# Patient Record
Sex: Female | Born: 1980 | Hispanic: No | Marital: Married | Smoking: Never smoker
Health system: Southern US, Community
[De-identification: ages and names within clinical notes are randomized; demographics above are authoritative.]

## PROBLEM LIST (undated history)

## (undated) ENCOUNTER — Inpatient Hospital Stay (HOSPITAL_COMMUNITY): Payer: Self-pay

## (undated) DIAGNOSIS — IMO0002 Reserved for concepts with insufficient information to code with codable children: Secondary | ICD-10-CM

## (undated) DIAGNOSIS — D649 Anemia, unspecified: Secondary | ICD-10-CM

## (undated) DIAGNOSIS — I10 Essential (primary) hypertension: Secondary | ICD-10-CM

## (undated) DIAGNOSIS — O0930 Supervision of pregnancy with insufficient antenatal care, unspecified trimester: Secondary | ICD-10-CM

## (undated) DIAGNOSIS — O24419 Gestational diabetes mellitus in pregnancy, unspecified control: Secondary | ICD-10-CM

## (undated) DIAGNOSIS — R87619 Unspecified abnormal cytological findings in specimens from cervix uteri: Secondary | ICD-10-CM

## (undated) DIAGNOSIS — J13 Pneumonia due to Streptococcus pneumoniae: Secondary | ICD-10-CM

## (undated) HISTORY — DX: Supervision of pregnancy with insufficient antenatal care, unspecified trimester: O09.30

## (undated) HISTORY — DX: Unspecified abnormal cytological findings in specimens from cervix uteri: R87.619

## (undated) HISTORY — DX: Reserved for concepts with insufficient information to code with codable children: IMO0002

## (undated) HISTORY — PX: NO PAST SURGERIES: SHX2092

## (undated) HISTORY — DX: Anemia, unspecified: D64.9

## (undated) HISTORY — DX: Essential (primary) hypertension: I10

## (undated) HISTORY — DX: Gestational diabetes mellitus in pregnancy, unspecified control: O24.419

---
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
## 2001-03-22 ENCOUNTER — Encounter (INDEPENDENT_AMBULATORY_CARE_PROVIDER_SITE_OTHER): Payer: Self-pay | Admitting: Specialist

## 2001-03-22 ENCOUNTER — Inpatient Hospital Stay (HOSPITAL_COMMUNITY): Admission: AD | Admit: 2001-03-22 | Discharge: 2001-03-25 | Payer: Self-pay | Admitting: *Deleted

## 2002-03-25 ENCOUNTER — Other Ambulatory Visit: Admission: RE | Admit: 2002-03-25 | Discharge: 2002-03-25 | Payer: Self-pay | Admitting: Obstetrics & Gynecology

## 2003-07-01 ENCOUNTER — Inpatient Hospital Stay (HOSPITAL_COMMUNITY): Admission: AD | Admit: 2003-07-01 | Discharge: 2003-07-04 | Payer: Self-pay | Admitting: Obstetrics & Gynecology

## 2004-01-27 ENCOUNTER — Ambulatory Visit (HOSPITAL_COMMUNITY): Admission: RE | Admit: 2004-01-27 | Discharge: 2004-01-27 | Payer: Self-pay | Admitting: Obstetrics & Gynecology

## 2004-06-23 ENCOUNTER — Inpatient Hospital Stay (HOSPITAL_COMMUNITY): Admission: AD | Admit: 2004-06-23 | Discharge: 2004-06-25 | Payer: Self-pay | Admitting: Obstetrics & Gynecology

## 2006-11-17 ENCOUNTER — Ambulatory Visit (HOSPITAL_COMMUNITY): Admission: RE | Admit: 2006-11-17 | Discharge: 2006-11-17 | Payer: Self-pay | Admitting: Obstetrics & Gynecology

## 2007-01-29 ENCOUNTER — Ambulatory Visit (HOSPITAL_COMMUNITY): Admission: RE | Admit: 2007-01-29 | Discharge: 2007-01-29 | Payer: Self-pay | Admitting: Obstetrics & Gynecology

## 2007-06-21 ENCOUNTER — Ambulatory Visit (HOSPITAL_COMMUNITY): Admission: RE | Admit: 2007-06-21 | Discharge: 2007-06-21 | Payer: Self-pay | Admitting: Obstetrics & Gynecology

## 2007-06-23 ENCOUNTER — Inpatient Hospital Stay (HOSPITAL_COMMUNITY): Admission: AD | Admit: 2007-06-23 | Discharge: 2007-06-25 | Payer: Self-pay | Admitting: Obstetrics & Gynecology

## 2008-09-22 ENCOUNTER — Emergency Department (HOSPITAL_COMMUNITY): Admission: EM | Admit: 2008-09-22 | Discharge: 2008-09-23 | Payer: Self-pay | Admitting: Emergency Medicine

## 2009-05-13 ENCOUNTER — Emergency Department (HOSPITAL_COMMUNITY): Admission: EM | Admit: 2009-05-13 | Discharge: 2009-05-13 | Payer: Self-pay | Admitting: Family Medicine

## 2009-06-03 ENCOUNTER — Encounter (INDEPENDENT_AMBULATORY_CARE_PROVIDER_SITE_OTHER): Payer: Self-pay | Admitting: Family Medicine

## 2009-06-03 ENCOUNTER — Ambulatory Visit: Payer: Self-pay | Admitting: Family Medicine

## 2009-06-03 LAB — CONVERTED CEMR LAB
ALT: 19 units/L (ref 0–35)
AST: 17 units/L (ref 0–37)
Albumin: 4.2 g/dL (ref 3.5–5.2)
Basophils Relative: 0 % (ref 0–1)
CRP: 2.9 mg/dL — ABNORMAL HIGH (ref <0.6)
Calcium: 9.3 mg/dL (ref 8.4–10.5)
Cholesterol: 182 mg/dL (ref 0–200)
Eosinophils Absolute: 1.2 10*3/uL — ABNORMAL HIGH (ref 0.0–0.7)
Eosinophils Relative: 23 % — ABNORMAL HIGH (ref 0–5)
HDL: 60 mg/dL (ref >39)
LDL Cholesterol: 102 mg/dL — ABNORMAL HIGH (ref 0–99)
Lymphocytes Relative: 27 % (ref 12–46)
Monocytes Absolute: 0.6 10*3/uL (ref 0.1–1.0)
Monocytes Relative: 12 % (ref 3–12)
Neutro Abs: 2 10*3/uL (ref 1.7–7.7)
Neutrophils Relative %: 38 % — ABNORMAL LOW (ref 43–77)
Platelets: 305 10*3/uL (ref 150–400)
RDW: 17.5 % — ABNORMAL HIGH (ref 11.5–15.5)
Sodium: 137 meq/L (ref 135–145)
Total Bilirubin: 0.2 mg/dL — ABNORMAL LOW (ref 0.3–1.2)
Total CHOL/HDL Ratio: 3
VLDL: 20 mg/dL (ref 0–40)
Vit D, 25-Hydroxy: 26 ng/mL — ABNORMAL LOW (ref 30–89)
WBC: 5.4 10*3/uL (ref 4.0–10.5)

## 2009-06-04 ENCOUNTER — Encounter (INDEPENDENT_AMBULATORY_CARE_PROVIDER_SITE_OTHER): Payer: Self-pay | Admitting: Family Medicine

## 2009-08-21 ENCOUNTER — Encounter (INDEPENDENT_AMBULATORY_CARE_PROVIDER_SITE_OTHER): Payer: Self-pay | Admitting: Family Medicine

## 2009-08-21 ENCOUNTER — Ambulatory Visit: Payer: Self-pay | Admitting: Internal Medicine

## 2009-08-21 LAB — CONVERTED CEMR LAB
Basophils Relative: 1 % (ref 0–1)
CRP: 2.3 mg/dL — ABNORMAL HIGH (ref <0.6)
Casts: NONE SEEN /lpf
Eosinophils Absolute: 0.8 10*3/uL — ABNORMAL HIGH (ref 0.0–0.7)
Eosinophils Relative: 16 % — ABNORMAL HIGH (ref 0–5)
Free T4: 1.04 ng/dL (ref 0.80–1.80)
HCT: 36 % (ref 36.0–46.0)
Lymphocytes Relative: 21 % (ref 12–46)
Lymphs Abs: 1.1 10*3/uL (ref 0.7–4.0)
MCHC: 31.7 g/dL (ref 30.0–36.0)
Monocytes Absolute: 0.6 10*3/uL (ref 0.1–1.0)
Monocytes Relative: 11 % (ref 3–12)
Neutro Abs: 2.7 10*3/uL (ref 1.7–7.7)
Neutrophils Relative %: 52 % (ref 43–77)
RBC: 4.8 M/uL (ref 3.87–5.11)
RDW: 14.9 % (ref 11.5–15.5)
TIBC: 371 ug/dL (ref 250–470)
UIBC: 344 ug/dL

## 2010-01-12 ENCOUNTER — Encounter: Payer: Self-pay | Admitting: Family Medicine

## 2010-01-12 ENCOUNTER — Ambulatory Visit (HOSPITAL_COMMUNITY)
Admission: RE | Admit: 2010-01-12 | Discharge: 2010-01-12 | Payer: Self-pay | Source: Home / Self Care | Attending: Family Medicine | Admitting: Family Medicine

## 2010-02-17 ENCOUNTER — Encounter: Payer: Self-pay | Admitting: Family Medicine

## 2010-02-17 ENCOUNTER — Ambulatory Visit (HOSPITAL_COMMUNITY)
Admission: RE | Admit: 2010-02-17 | Discharge: 2010-02-17 | Payer: Self-pay | Source: Home / Self Care | Attending: Family Medicine | Admitting: Family Medicine

## 2010-04-13 LAB — POCT URINALYSIS DIP (DEVICE)
Bilirubin Urine: NEGATIVE
Glucose, UA: NEGATIVE mg/dL
Nitrite: NEGATIVE
Specific Gravity, Urine: 1.015 (ref 1.005–1.030)
Urobilinogen, UA: 1 mg/dL (ref 0.0–1.0)
pH: 7 (ref 5.0–8.0)

## 2010-05-05 ENCOUNTER — Inpatient Hospital Stay (HOSPITAL_COMMUNITY): Admission: AD | Admit: 2010-05-05 | Payer: Self-pay | Admitting: Obstetrics & Gynecology

## 2010-06-08 NOTE — H&P (Signed)
Sarah Arnold, Sarah Arnold             ACCOUNT NO.:  [id number]   MEDICAL RECORD NO.:  [id number]          PATIENT TYPE:  INP   LOCATION:  9144                          FACILITY:  [facility]   PHYSICIAN:  Roseanna Rainbow, M.D.DATE OF BIRTH:  21-Jun-1980   DATE OF ADMISSION:  06/23/2007  DATE OF DISCHARGE:                              HISTORY & PHYSICAL   CHIEF COMPLAINT:  The patient is a 30 year old para 3 with an estimated  date of confinement of Jun 16, 2007, with an intrauterine pregnancy at  55 weeks' for induction of labor secondary to postdates.   HISTORY OF PRESENT ILLNESS:  Please see the above.   ALLERGIES:  No known drug allergies.   MEDICATIONS:  Prenatal vitamins.   OB RISK FACTORS:  Anemia, GBS positive, and hyperemesis gravidarum  earlier in the pregnancy.   PRENATAL LABS:  Blood type is O+, antibody screen negative.  Chlamydia  probe negative.  Urine culture and sensitivity, insignificant growth.  GC probe negative.  Pap smear negative.  One-hour GCT 127.  Hepatitis B  surface antigen negative.  Hematocrit 28.8 and hemoglobin 8.8.  HIV  nonreactive.  Platelets 279,000.  RPR nonreactive.  Rubella immune.  Sickle cell negative.  GBS positive on May 15, 2007.   PAST OBSTETRICAL HISTORY:  In May 2006, she was delivered of a liveborn  female at full term, 7 pounds 15 ounces, vaginal delivery, no  complications.  In June 2005, she was delivered of a liveborn female, 6  pounds 10 ounces at 42 weeks', vaginal delivery.  In February 2003, she  was delivered of a liveborn female, full term, 6 pounds 6 ounces,  vaginal delivery, no complications.   PAST MEDICAL HISTORY:  No significant history of medical diseases.   PAST SURGICAL HISTORY:  No previous surgery.   SOCIAL HISTORY:  She is unemployed, engaged, living with the significant  other.  She does not give any significant history of alcohol usage.  She  has no significant smoking history.  She denies illicit drug  use.   FAMILY HISTORY:  No major illnesses known.   PHYSICAL EXAMINATION:  Vital signs are stable and afebrile.  Fetal heart  tracing is reassuring.  Tocodynamometer, irregular uterine contractions.  Sterile vaginal exam per the RN, the cervix is 1 cm dilated, 70%  effaced.   ASSESSMENT:  Multipara at 16 weeks' with a favorable Bishop score.  Fetal heart tracing is consistent with fetal well-being.  Group B  streptococcus positive.  Mild anemia.   PLAN:  Admission.  Penicillin for GBS prophylaxis.  Low-dose Pitocin per  protocol.      Roseanna Rainbow, M.D.  Electronically Signed     LAJ/MEDQ  D:  06/23/2007  T:  06/23/2007  Job:  [id number]

## 2010-06-11 NOTE — H&P (Signed)
[facility]  Patient:    Sarah Arnold, Sarah Arnold Visit Number: [id number] MRN: [id number]          Service Type: OBS Location: 910B [id number] [id number] Attending Physician:  Genia Del Dictated by:   Genia Del, M.D. Admit Date:  03/22/2001                           History and Physical  DATE OF BIRTH:                May 21, 1980  PATIENT PROFILE:              The patient is a 30 year old woman, G1, expected date of delivery by ultrasound March 29, 2001, at [redacted] weeks gestation.  REASON FOR ADMISSION:         Spontaneous rupture of membranes with uterine contractions.  HISTORY OF PRESENT ILLNESS:   The patient had small amounts of clear fluid since 5 p.m. March 22, 2001.  She also mentions a very small amount of clear fluid on the 26th at 4 p.m.  Uterine contractions started increasing during last evening.  No vaginal bleeding.  No fever.  Good fetal movements. No PIH symptoms.  PAST MEDICAL HISTORY:         Negative.  PAST SURGICAL HISTORY:        Negative.  PAST OBSTETRICAL HISTORY:     Current.  PAST GYNECOLOGIC HISTORY:     Known uterine fibroids as of ultrasound November 2001.  ALLERGIES:                    No known drug allergies.  MEDICATIONS:                  Prenatal vitamins.  FAMILY HISTORY:               Noncontributory.  SOCIAL HISTORY:               Married, nonsmoker.  HISTORY OF PRESENT PREGNANCY:                    First trimester normal.  Labs showed a hemoglobin at 11.7, platelets 319.  Blood type Rh O positive, Rh antibody is negative.  Sickle cell trait negative.  Thalassemia negative.  RPR nonreactive.  HBsAg negative.  HIV negative.  Rubella immune.  The patient used Zofran and Reglan for hyperemesis gravidarum which only improved at the end of the second trimester.  Triple test was within normal limits at 16+ weeks.  An ultrasound done at 17+ weeks showed nonconcordant dating and expected date of  delivery was changed to ultrasound.  Ultrasound review of anatomy at 21+ weeks was within normal limits, placenta was posterior normal, amniotic fluid within normal limits, cervix was long and closed.  In the third trimester one-hour GTT was within normal limits.  At 32+ weeks an ultrasound for interval growth showed a sixth percentile; the patient was advised bed rest and increased proteins.  She was then followed with NSTs which were reactive, and a repeat ultrasound at 35+ weeks showed an 18th percentile estimated fetal weight.  Biophysical profiles were reassuring at 8/10 at 37+ weeks and amniotic fluid index was 80th percentile.  NSTs remained reactive at 38+ weeks.  Blood pressures remained normal throughout pregnancy. Group B strep was positive.  REVIEW OF SYSTEMS:            Constitutional: Negative.  HEENT: Negative. Cardiovascular, respiratory, GI, urology: Negative.  Neurologic, endocrine and dermatome: Negative.  PHYSICAL EXAMINATION:  GENERAL:                      No apparent distress except for pain with uterine contractions.  VITAL SIGNS:                  Blood pressure 112/72, respiratory rate 18, temperature 98.3, heart rate 98 and regular.  LUNGS:                        Clear bilaterally.  HEART:                        Regular cardiac rhythm, no murmur.  ABDOMEN:                      Gravid, cephalic presentation.  VAGINAL EXAM:                 Fingertip, 90%, vertex -1, bulging membranes felt but fern positive.  EXTREMITIES:                  Lower limbs normal.  MONITORING:                   Uterine contractions every three minutes 2+ lasting about 50 seconds.  FETAL HEART RATE MONITORING:  Baseline 130 with good variability, accelerations positive, no decelerations.  IMPRESSION:                   1. Gravida 1, [redacted] weeks gestation with                                  spontaneous rupture of membranes.                               2. Fetal heart rate  monitoring reassuring.                               3. Group B Streptococcus positive.  PLAN:                         Admit to labor and delivery.  Monitoring. Penicillin G protocol.  Expectant management towards probable vaginal delivery. Dictated by:   Genia Del, M.D. Attending Physician:  Genia Del DD:  03/23/01 TD:  03/23/01 Job: [id number]

## 2010-06-22 ENCOUNTER — Inpatient Hospital Stay (HOSPITAL_COMMUNITY)
Admission: AD | Admit: 2010-06-22 | Discharge: 2010-06-22 | Disposition: A | Discharge status: Home / Self Care | Payer: Self-pay | Source: Ambulatory Visit | Attending: Family Medicine | Admitting: Family Medicine

## 2010-06-22 DIAGNOSIS — O479 False labor, unspecified: Secondary | ICD-10-CM

## 2010-06-23 ENCOUNTER — Inpatient Hospital Stay (HOSPITAL_COMMUNITY)
Admission: EM | Admit: 2010-06-23 | Discharge: 2010-06-25 | DRG: 775 | Disposition: A | Discharge status: Home / Self Care | Payer: Medicaid Other | Source: Ambulatory Visit | Attending: Family Medicine | Admitting: Family Medicine

## 2010-06-23 LAB — CBC
Hemoglobin: 10.3 g/dL — ABNORMAL LOW (ref 12.0–15.0)
MCH: 22.1 pg — ABNORMAL LOW (ref 26.0–34.0)
Platelets: 268 10*3/uL (ref 150–400)
RDW: 17.2 % — ABNORMAL HIGH (ref 11.5–15.5)

## 2010-06-23 LAB — ABO/RH: ABO/RH(D): O POS

## 2010-07-23 ENCOUNTER — Inpatient Hospital Stay (HOSPITAL_COMMUNITY)
Admission: AD | Admit: 2010-07-23 | Discharge: 2010-07-23 | Disposition: A | Discharge status: Home / Self Care | Payer: Self-pay | Source: Ambulatory Visit | Attending: Obstetrics & Gynecology | Admitting: Obstetrics & Gynecology

## 2010-07-23 DIAGNOSIS — O2390 Unspecified genitourinary tract infection in pregnancy, unspecified trimester: Secondary | ICD-10-CM | POA: Insufficient documentation

## 2010-07-23 DIAGNOSIS — N390 Urinary tract infection, site not specified: Secondary | ICD-10-CM | POA: Insufficient documentation

## 2010-07-23 LAB — URINALYSIS, ROUTINE W REFLEX MICROSCOPIC
Glucose, UA: NEGATIVE mg/dL
Ketones, ur: NEGATIVE mg/dL
Nitrite: POSITIVE — AB
Protein, ur: 100 mg/dL — AB
Specific Gravity, Urine: 1.015 (ref 1.005–1.030)
Urobilinogen, UA: 0.2 mg/dL (ref 0.0–1.0)
pH: 6 (ref 5.0–8.0)

## 2010-07-23 LAB — URINE MICROSCOPIC-ADD ON

## 2010-07-23 LAB — CBC
HCT: 33.1 % — ABNORMAL LOW (ref 36.0–46.0)
MCH: 22.7 pg — ABNORMAL LOW (ref 26.0–34.0)
MCHC: 32.3 g/dL (ref 30.0–36.0)
WBC: 9.6 10*3/uL (ref 4.0–10.5)

## 2010-07-25 LAB — URINE CULTURE

## 2010-10-20 LAB — CBC
HCT: 32.3 — ABNORMAL LOW
Hemoglobin: 10.3 — ABNORMAL LOW
MCHC: 31.9
MCV: 78.2
WBC: 14.2 — ABNORMAL HIGH

## 2010-10-20 LAB — RPR: RPR Ser Ql: NONREACTIVE

## 2011-01-19 ENCOUNTER — Encounter (HOSPITAL_COMMUNITY): Payer: Self-pay | Admitting: Family Medicine

## 2011-01-19 ENCOUNTER — Emergency Department (INDEPENDENT_AMBULATORY_CARE_PROVIDER_SITE_OTHER): Payer: Self-pay

## 2011-01-19 ENCOUNTER — Emergency Department (HOSPITAL_COMMUNITY)
Admission: EM | Admit: 2011-01-19 | Discharge: 2011-01-19 | Disposition: A | Discharge status: Home / Self Care | Payer: Self-pay | Source: Home / Self Care | Attending: Family Medicine | Admitting: Family Medicine

## 2011-01-19 DIAGNOSIS — J189 Pneumonia, unspecified organism: Secondary | ICD-10-CM

## 2011-01-19 MED ORDER — FEXOFENADINE-PSEUDOEPHED ER 60-120 MG PO TB12: 1.0000 | ORAL_TABLET | Freq: Two times a day (BID) | ORAL | Status: DC

## 2011-01-19 MED ORDER — IBUPROFEN 600 MG PO TABS: 600.0000 mg | ORAL_TABLET | Freq: Three times a day (TID) | ORAL | Status: DC | PRN

## 2011-01-19 MED ORDER — AZITHROMYCIN 250 MG PO TABS: 250.0000 mg | ORAL_TABLET | Freq: Every day | ORAL | Status: DC

## 2011-01-19 MED ORDER — CEFTRIAXONE SODIUM 1 G IJ SOLR
INTRAMUSCULAR | Status: AC
  Filled 2011-01-19: qty 10

## 2011-01-19 MED ORDER — AZITHROMYCIN 250 MG PO TABS
ORAL_TABLET | ORAL | Status: AC
  Filled 2011-01-19: qty 4

## 2011-01-19 MED ORDER — ONDANSETRON 4 MG PO TBDP
4.0000 mg | ORAL_TABLET | Freq: Once | ORAL | Status: AC
  Administered 2011-01-19: 4 mg via ORAL

## 2011-01-19 MED ORDER — AZITHROMYCIN 250 MG PO TABS: 250.0000 mg | ORAL_TABLET | Freq: Every day | ORAL | Status: AC

## 2011-01-19 MED ORDER — AZITHROMYCIN 1 G PO PACK
1.0000 g | PACK | Freq: Once | ORAL | Status: AC
  Administered 2011-01-19: 1 g via ORAL

## 2011-01-19 MED ORDER — PREDNISONE 20 MG PO TABS: ORAL_TABLET | ORAL | Status: DC

## 2011-01-19 MED ORDER — ONDANSETRON 4 MG PO TBDP
ORAL_TABLET | ORAL | Status: AC
  Filled 2011-01-19: qty 1

## 2011-01-19 MED ORDER — ACETAMINOPHEN-CODEINE 120-12 MG/5ML PO SUSP: 5.0000 mL | Freq: Four times a day (QID) | ORAL | Status: DC | PRN

## 2011-01-19 MED ORDER — HYDROCODONE-ACETAMINOPHEN 7.5-325 MG/15ML PO SOLN: 15.0000 mL | Freq: Three times a day (TID) | ORAL | Status: AC | PRN

## 2011-01-19 MED ORDER — CEFTRIAXONE SODIUM 1 G IJ SOLR
1.0000 g | Freq: Once | INTRAMUSCULAR | Status: AC
  Administered 2011-01-19: 1 g via INTRAMUSCULAR

## 2011-01-19 NOTE — ED Notes (Signed)
PT MONITORED POST ZOFRAN.STATES SX RESOLVED AND PT D/C'D

## 2011-01-19 NOTE — ED Notes (Signed)
PT D'CD BUT RETURNED FROM WAITNG ROOM C/O NAUSEA AND FEELING OF VOMITING.MEDICATED WITH ZOFRAN 4MG  ODT..WILL MONITOR PT WHILE IN WAITING AREA

## 2011-01-19 NOTE — ED Provider Notes (Signed)
History     CSN: [id number]  Arrival date & time 01/19/11  1616   First MD Initiated Contact with Patient 01/19/11 1706      Chief Complaint  Patient presents with  . URI  . Influenza    (Consider location/radiation/quality/duration/timing/severity/associated sxs/prior treatment) HPI Comments: 30 y/o female non smoker, no significant past medical history comes c/o cold symptoms that started about 1 week ago; comes today with persistent cough, headache and subjective fever, decreased appetite, denies shortness of breath, chest pain or pain with inspiration. No vomiting or diarrhea. Has not taken any medication today for pain or fever. Feels tired.   History reviewed. No pertinent past medical history.  No past surgical history on file.  No family history on file.  History  Substance Use Topics  . Smoking status: Not on file  . Smokeless tobacco: Not on file  . Alcohol Use: Not on file    OB History    Grav Para Term Preterm Abortions TAB SAB Ect Mult Living                  Review of Systems  Constitutional: Positive for fever, appetite change and fatigue.  HENT: Positive for congestion, sore throat and rhinorrhea. Negative for ear pain, trouble swallowing, neck pain, neck stiffness and voice change.   Respiratory: Positive for cough. Negative for chest tightness, shortness of breath, wheezing and stridor.   Cardiovascular: Negative for chest pain, palpitations and leg swelling.  Gastrointestinal: Negative for nausea, vomiting, abdominal pain and diarrhea.  Musculoskeletal: Positive for myalgias and arthralgias.  Skin: Negative for rash.  Neurological: Positive for headaches.    Allergies  Review of patient's allergies indicates no known allergies.  Home Medications   Current Outpatient Rx  Name Route Sig Dispense Refill  . AZITHROMYCIN 250 MG PO TABS Oral Take 1 tablet (250 mg total) by mouth daily. Take first 2 tablets together, then 1 every day until  finished. 6 tablet 0  . HYDROCODONE-ACETAMINOPHEN 7.5-325 MG/15ML PO SOLN Oral Take 15 mLs by mouth every 8 (eight) hours as needed for pain. 120 mL 0    BP 101/69  Pulse 94  Temp(Src) 99.3 F (37.4 C) (Oral)  Resp 16  SpO2 100%  Physical Exam  Nursing note and vitals reviewed. Constitutional: She is oriented to person, place, and time. She appears well-developed and well-nourished. No distress.  HENT:  Head: Normocephalic and atraumatic.  Right Ear: External ear normal.  Left Ear: External ear normal.       Pharyngeal erythema with exudate in right tonsil. No peritonsillar edema or fluctuation, uvula central. No retropharyngeal or peritonsillar abscess.   Eyes: Conjunctivae and EOM are normal. Pupils are equal, round, and reactive to light. Right eye exhibits no discharge. Left eye exhibits no discharge.  Neck: Normal range of motion. Neck supple.  Cardiovascular: Normal rate, regular rhythm, normal heart sounds and intact distal pulses.   No murmur heard. Pulmonary/Chest: Effort normal.       Decreased breath sounds and extensive crackles in right base. No tachypnea or pain with deep inspiration.  Abdominal: Soft. There is no tenderness.  Musculoskeletal: She exhibits no edema.  Lymphadenopathy:    She has no cervical adenopathy.  Neurological: She is alert and oriented to person, place, and time.  Skin: No rash noted.    ED Course  Procedures (including critical care time)  Labs Reviewed - No data to display Dg Chest 2 View  01/19/2011  *RADIOLOGY REPORT*  Clinical  Data: Cough, congestion  CHEST - 2 VIEW  Comparison: 09/22/2008  Findings: Coarse nodular/interstitial opacities throughout all lobes, with a right lower lobe predominance, suspicious for multifocal pneumonia.  No pleural effusion or pneumothorax.  Cardiomediastinal silhouette is within normal limits.  Visualized osseous structures are within normal limits.  IMPRESSION: Suspected multifocal pneumonia with a right  lower lobe predominance.  Viral or atypical bacterial infection is suspected.  Follow-up chest radiographs are suggested to document resolution.  Original Report Authenticated By: Charline Bills, M.D.     1. Atypical pneumonia       MDM  Non toxic appearance, clinically well does not correlates with extensive Xrays findings. Suspect influenza pneumonia vs atypical pneumonia. Administered rocephin 1g IM and azithromycin 1 g oral here. prescribed azithromycin oral to continue outpatient therapy will recheck in 48 hours or patient will return earlier if respiratory distress or any worsening of her symptoms.        Sharin Grave, MD 01/20/11 [phone]

## 2011-01-19 NOTE — ED Notes (Signed)
PT HERE WITH FLU LIE/URI SX THAT STARTED X 1 WK WITH COUGHING  YELLOW/BLOOD TINGED MUCOUS,NASAL CONGESTION AND BODY ACHES AND FATIGUE.TEMP ON ARRIVAL 99.3.DENIES N/V/D

## 2011-01-21 ENCOUNTER — Emergency Department (INDEPENDENT_AMBULATORY_CARE_PROVIDER_SITE_OTHER)
Admission: EM | Admit: 2011-01-21 | Discharge: 2011-01-21 | Disposition: A | Discharge status: Home / Self Care | Payer: Self-pay | Source: Home / Self Care | Attending: Family Medicine | Admitting: Family Medicine

## 2011-01-21 ENCOUNTER — Encounter (HOSPITAL_COMMUNITY): Payer: Self-pay | Admitting: *Deleted

## 2011-01-21 DIAGNOSIS — J189 Pneumonia, unspecified organism: Secondary | ICD-10-CM

## 2011-01-21 MED ORDER — AZITHROMYCIN 250 MG PO TABS: ORAL_TABLET | ORAL | Status: AC

## 2011-01-21 NOTE — ED Provider Notes (Signed)
History     CSN: [id number]  Arrival date & time 01/21/11  1509   None     Chief Complaint  Patient presents with  . Follow-up    (Consider location/radiation/quality/duration/timing/severity/associated sxs/prior treatment) Patient is a 30 y.o. female presenting with cough. The history is provided by the patient.  Cough This is a new problem. Episode onset: seen 12/26 and inproving with pna, lost last 2 pills ,  The problem has been gradually improving (less cough, no fever or phlegm., no sob.). The cough is non-productive. There has been no fever.    History reviewed. No pertinent past medical history.  History reviewed. No pertinent past surgical history.  History reviewed. No pertinent family history.  History  Substance Use Topics  . Smoking status: Not on file  . Smokeless tobacco: Not on file  . Alcohol Use: Not on file    OB History    Grav Para Term Preterm Abortions TAB SAB Ect Mult Living                  Review of Systems  Constitutional: Negative.   HENT: Negative.   Respiratory: Positive for cough.   Cardiovascular: Negative.   Gastrointestinal: Negative.   Skin: Negative.     Allergies  Review of patient's allergies indicates no known allergies.  Home Medications   Current Outpatient Rx  Name Route Sig Dispense Refill  . AZITHROMYCIN 250 MG PO TABS  Take as directed on pack 6 each 0  . AZITHROMYCIN 250 MG PO TABS Oral Take 1 tablet (250 mg total) by mouth daily. Take first 2 tablets together, then 1 every day until finished. 6 tablet 0  . HYDROCODONE-ACETAMINOPHEN 7.5-325 MG/15ML PO SOLN Oral Take 15 mLs by mouth every 8 (eight) hours as needed for pain. 120 mL 0    BP 103/63  Pulse 79  Temp(Src) 97.6 F (36.4 C) (Oral)  Resp 20  SpO2 100%  LMP 11/24/2010  Physical Exam  Nursing note and vitals reviewed. Constitutional: She appears well-developed and well-nourished.  HENT:  Head: Normocephalic.  Right Ear: External ear normal.    Left Ear: External ear normal.  Mouth/Throat: Oropharynx is clear and moist.  Eyes: Pupils are equal, round, and reactive to light.  Neck: Normal range of motion. Neck supple.  Pulmonary/Chest: Effort normal and breath sounds normal. She has no wheezes. She has no rales.  Lymphadenopathy:    She has no cervical adenopathy.  Neurological: She is alert.  Skin: Skin is warm and dry.  Psychiatric: She has a normal mood and affect.    ED Course  Procedures (including critical care time)  Labs Reviewed - No data to display No results found.   1. CAP (community acquired pneumonia)       MDM          Barkley Bruns, MD 01/25/11 [phone]

## 2011-01-21 NOTE — ED Notes (Signed)
Pt  Seen  2  Days  Ago  For  pnuemonia   She  Reports  She  Feels  Better  She  States  She  Lost  2  Of  Her  zithromax  Pills      She  Is  Awake as  Well as  Alert  And  Oriented

## 2011-08-15 ENCOUNTER — Emergency Department (INDEPENDENT_AMBULATORY_CARE_PROVIDER_SITE_OTHER): Payer: Self-pay

## 2011-08-15 ENCOUNTER — Encounter (HOSPITAL_COMMUNITY): Payer: Self-pay

## 2011-08-15 ENCOUNTER — Emergency Department (INDEPENDENT_AMBULATORY_CARE_PROVIDER_SITE_OTHER)
Admission: EM | Admit: 2011-08-15 | Discharge: 2011-08-15 | Disposition: A | Discharge status: Home / Self Care | Payer: Self-pay | Source: Home / Self Care | Attending: Emergency Medicine | Admitting: Emergency Medicine

## 2011-08-15 DIAGNOSIS — J189 Pneumonia, unspecified organism: Secondary | ICD-10-CM

## 2011-08-15 LAB — CBC WITH DIFFERENTIAL/PLATELET
Eosinophils Absolute: 1.5 10*3/uL — ABNORMAL HIGH (ref 0.0–0.7)
Hemoglobin: 11 g/dL — ABNORMAL LOW (ref 12.0–15.0)
Lymphocytes Relative: 19 % (ref 12–46)
Lymphs Abs: 1.4 10*3/uL (ref 0.7–4.0)
MCH: 22.5 pg — ABNORMAL LOW (ref 26.0–34.0)
Monocytes Relative: 12 % (ref 3–12)
Neutro Abs: 3.7 10*3/uL (ref 1.7–7.7)
Neutrophils Relative %: 50 % (ref 43–77)
Platelets: 318 10*3/uL (ref 150–400)
RBC: 4.88 MIL/uL (ref 3.87–5.11)
WBC: 7.5 10*3/uL (ref 4.0–10.5)

## 2011-08-15 LAB — POCT I-STAT, CHEM 8
BUN: 18 mg/dL (ref 6–23)
Chloride: 104 mEq/L (ref 96–112)
Creatinine, Ser: 1.2 mg/dL — ABNORMAL HIGH (ref 0.50–1.10)
Potassium: 3.6 mEq/L (ref 3.5–5.1)
Sodium: 142 mEq/L (ref 135–145)

## 2011-08-15 MED ORDER — AMOXICILLIN-POT CLAVULANATE 500-125 MG PO TABS: 1.0000 | ORAL_TABLET | Freq: Two times a day (BID) | ORAL | Status: AC

## 2011-08-15 MED ORDER — ALBUTEROL SULFATE HFA 108 (90 BASE) MCG/ACT IN AERS: 1.0000 | INHALATION_SPRAY | Freq: Four times a day (QID) | RESPIRATORY_TRACT | Status: DC | PRN

## 2011-08-15 MED ORDER — AZITHROMYCIN 250 MG PO TABS: 250.0000 mg | ORAL_TABLET | Freq: Every day | ORAL | Status: AC

## 2011-08-15 MED ORDER — GUAIFENESIN-CODEINE 100-10 MG/5ML PO SYRP: 5.0000 mL | ORAL_SOLUTION | Freq: Four times a day (QID) | ORAL | Status: AC | PRN

## 2011-08-15 NOTE — ED Notes (Signed)
Pt states that she has a productive cough x 2 days. Pt states that she has a fever x 1 day. Pt states that "every time I cough, my ribs hurt a lot."

## 2011-08-15 NOTE — ED Provider Notes (Addendum)
History     CSN: [id number]  Arrival date & time 08/15/11  1219   First MD Initiated Contact with Patient 08/15/11 1316      Chief Complaint  Patient presents with  . Cough    (Consider location/radiation/quality/duration/timing/severity/associated sxs/prior treatment) HPI Comments: Patient presents urgent care complaining of worsening productive cough for about 3 days. She has been feeling bodyaches and chills and her cough seemed to be worsening. When she coughs she describes her chest hurts mainly on the right lower area. She vomited once. And has no appetite. Not too long ago she came in she describes as she had pneumonia she's wondering if she's feeling very bad and thinks that this is no longer a cold came in today to be checked.  Patient is a 31 y.o. female presenting with cough. The history is provided by the patient.  Cough This is a new problem. The current episode started more than 2 days ago. The problem occurs constantly. The problem has been gradually worsening. The cough is productive of sputum. The maximum temperature recorded prior to her arrival was 100 to 100.9 F. Associated symptoms include chills, sweats, headaches, rhinorrhea and shortness of breath. Pertinent negatives include no wheezing. She has tried decongestants for the symptoms. She is not a smoker. Her past medical history is significant for pneumonia. Her past medical history does not include COPD, emphysema or asthma.    History reviewed. No pertinent past medical history.  History reviewed. No pertinent past surgical history.  History reviewed. No pertinent family history.  History  Substance Use Topics  . Smoking status: Not on file  . Smokeless tobacco: Not on file  . Alcohol Use: No    OB History    Grav Para Term Preterm Abortions TAB SAB Ect Mult Living                  Review of Systems  Constitutional: Positive for fever, chills, activity change and appetite change.  HENT: Positive  for rhinorrhea. Negative for sneezing, neck pain, neck stiffness and postnasal drip.   Respiratory: Positive for cough and shortness of breath. Negative for apnea, choking, chest tightness, wheezing and stridor.   Neurological: Positive for headaches. Negative for dizziness.    Allergies  Review of patient's allergies indicates no known allergies.  Home Medications  No current outpatient prescriptions on file.  BP 119/83  Pulse 90  Temp 99.8 F (37.7 C) (Oral)  Resp 18  SpO2 96%  Physical Exam  Nursing note and vitals reviewed. Constitutional: Vital signs are normal. She appears well-developed and well-nourished.  Non-toxic appearance. She does not have a sickly appearance. She appears ill. No distress.  HENT:  Head: Normocephalic.  Mouth/Throat: Uvula is midline, oropharynx is clear and moist and mucous membranes are normal.  Eyes: Conjunctivae are normal.  Neck: Normal range of motion. No JVD present.  Cardiovascular: Normal rate and normal heart sounds.  Exam reveals no gallop and no friction rub.   No murmur heard. Pulmonary/Chest: Effort normal. No respiratory distress. She has no decreased breath sounds. She has no wheezes. She has rhonchi. She has rales. She exhibits no tenderness.  Abdominal: Soft. She exhibits no distension. There is no tenderness. There is no rebound.  Musculoskeletal: Normal range of motion.  Lymphadenopathy:    She has no cervical adenopathy.  Neurological: She is alert.  Skin: Skin is warm.    ED Course  Procedures (including critical care time)  Labs Reviewed - No data to  display No results found.   No diagnosis found.    MDM   Patient symptomatic for 3 days only. Bilateral pneumonia with atypical pneumonia features. Have done some preliminary laboratory workup included an HIV test. Patient seemed to be having recurrent pneumonias the last 2 years. Have provided prescriptions of amoxicillin, azithromycin, codeine with Tylenol and  albuterol. Patient was instructed to return in 3 days for recheck. We discussed what symptoms will work further evaluation in the emergency department she feels comfortable with treatment and followup care as necessary       Jimmie Molly, MD 08/15/11 1430  Jimmie Molly, MD 08/15/11 [phone]

## 2011-08-16 LAB — HIV ANTIBODY (ROUTINE TESTING W REFLEX): HIV: NONREACTIVE

## 2011-08-19 ENCOUNTER — Emergency Department (INDEPENDENT_AMBULATORY_CARE_PROVIDER_SITE_OTHER)
Admission: EM | Admit: 2011-08-19 | Discharge: 2011-08-19 | Disposition: A | Discharge status: Home / Self Care | Payer: Self-pay | Source: Home / Self Care | Attending: Emergency Medicine | Admitting: Emergency Medicine

## 2011-08-19 ENCOUNTER — Encounter (HOSPITAL_COMMUNITY): Payer: Self-pay | Admitting: *Deleted

## 2011-08-19 DIAGNOSIS — J189 Pneumonia, unspecified organism: Secondary | ICD-10-CM

## 2011-08-19 HISTORY — DX: Pneumonia due to Streptococcus pneumoniae: J13

## 2011-08-19 NOTE — ED Notes (Addendum)
Pt  Seen   A  Few  Days  Ago  ucc  For    pnuemonia   Here  Today  For  A  followup         She  Reports  She  Is  Taking   Her  meds    And  Is  Feeling  Better

## 2011-08-19 NOTE — ED Provider Notes (Signed)
History     CSN: [id number]  Arrival date & time 08/19/11  1110   First MD Initiated Contact with Patient 08/19/11 1158      Chief Complaint  Patient presents with  . Follow-up    (Consider location/radiation/quality/duration/timing/severity/associated sxs/prior treatment) HPI Comments: Patient returns today for a guided followup visit, she reports she's feeling much better energy is coming back a bit of appetite is coming back, some sporadic cough but no fevers, she's taking antibiotics without any abdominal pain, she denies any fevers or shortness of breath or abdominal pain. Also denies any diarrhea does  The history is provided by the patient and the spouse.    Past Medical History  Diagnosis Date  . Pneumococcal pneumonia     History reviewed. No pertinent past surgical history.  No family history on file.  History  Substance Use Topics  . Smoking status: Not on file  . Smokeless tobacco: Not on file  . Alcohol Use: No    OB History    Grav Para Term Preterm Abortions TAB SAB Ect Mult Living                  Review of Systems  Constitutional: Positive for activity change and appetite change. Negative for fever, chills and diaphoresis.  Respiratory: Positive for cough. Negative for shortness of breath, wheezing and stridor.   Genitourinary: Negative for dysuria.  Musculoskeletal: Negative for back pain.    Allergies  Review of patient's allergies indicates no known allergies.  Home Medications   Current Outpatient Rx  Name Route Sig Dispense Refill  . ALBUTEROL SULFATE HFA 108 (90 BASE) MCG/ACT IN AERS Inhalation Inhale 1 puff into the lungs every 6 (six) hours as needed for wheezing. 1 Inhaler 0  . AMOXICILLIN-POT CLAVULANATE 500-125 MG PO TABS Oral Take 1 tablet (500 mg total) by mouth 2 (two) times daily. 20 tablet 0  . AZITHROMYCIN 250 MG PO TABS Oral Take 1 tablet (250 mg total) by mouth daily. Take first 2 tablets together, then 1 every day until  finished. 6 tablet 0  . GUAIFENESIN-CODEINE 100-10 MG/5ML PO SYRP Oral Take 5 mLs by mouth 4 (four) times daily as needed for cough. 120 mL 0    BP 106/69  Pulse 84  Temp 97.7 F (36.5 C) (Oral)  Resp 16  SpO2 96%  LMP 08/11/2011  Physical Exam  Constitutional: Vital signs are normal. She appears well-developed and well-nourished.  Non-toxic appearance. She does not have a sickly appearance. She does not appear ill.  Eyes: Conjunctivae are normal.  Cardiovascular: Normal rate and regular rhythm.   Pulmonary/Chest: Effort normal and breath sounds normal. No respiratory distress. She has no decreased breath sounds. She has no wheezes. She has no rhonchi. She has no rales. She exhibits no tenderness.  Skin: Skin is warm. No erythema.    ED Course  Procedures (including critical care time)  Labs Reviewed - No data to display No results found.   1. Community acquired pneumonia       MDM   Patient with remarkable clinical improvement, smiling feeling better mild sporadic cough afebrile for the last 2 days. She's taking her medications without problems which were confirmed to be Augmentin and azithromycin. Has taken some of the syrup. We have reviewed her labs we discuss a mild discrete anemia possibly iron deficiency although studies have not been done have encouraged patient to followup with primary care Dr. about 3 months to recheck her blood. We discussed  what symptoms will warrant evaluation in the emergency department, although I predicted this community acquired pneumonia will resolve after antibiotic regimen is completed.       Jimmie Molly, MD 08/19/11 1215

## 2012-01-25 NOTE — L&D Delivery Note (Signed)
Delivery Note SROM with SVE 2 min prior to birth>large amount watery mecomium, cx 7/80/0 OA. Large amt watery meconium-tinged AF. At 10:17 AM a viable female was delivered with single push. Controlled delivery.  via Vaginal, Spontaneous Delivery (Presentation: Left Occiput Anterior).  APGAR: 9, 9; weight pending Placenta status: Intact, Spontaneous.  Cord: 3 vessels with the following complications: None.   Anesthesia: None  Episiotomy: None Lacerations: None  Est. Blood Loss (mL): 400  Mom to postpartum.  Baby to nursery-stable. No abx given for GBS pos.  Sarah Arnold 10/05/2012, 10:47 AM

## 2012-05-04 ENCOUNTER — Emergency Department (INDEPENDENT_AMBULATORY_CARE_PROVIDER_SITE_OTHER)
Admission: EM | Admit: 2012-05-04 | Discharge: 2012-05-04 | Disposition: A | Discharge status: Home / Self Care | Payer: Self-pay | Source: Home / Self Care | Attending: Family Medicine | Admitting: Family Medicine

## 2012-05-04 ENCOUNTER — Encounter (HOSPITAL_COMMUNITY): Payer: Self-pay | Admitting: Emergency Medicine

## 2012-05-04 DIAGNOSIS — Z3490 Encounter for supervision of normal pregnancy, unspecified, unspecified trimester: Secondary | ICD-10-CM

## 2012-05-04 DIAGNOSIS — Z3201 Encounter for pregnancy test, result positive: Secondary | ICD-10-CM

## 2012-05-04 LAB — POCT PREGNANCY, URINE: Preg Test, Ur: POSITIVE — AB

## 2012-05-04 MED ORDER — PRENATAL VITAMINS (DIS) PO TABS: 1.0000 | ORAL_TABLET | ORAL | Status: DC

## 2012-05-04 NOTE — ED Provider Notes (Signed)
History     CSN: [id number]  Arrival date & time 05/04/12  1102   First MD Initiated Contact with Patient 05/04/12 1114      Chief Complaint  Patient presents with  . Possible Pregnancy    (Consider location/radiation/quality/duration/timing/severity/associated sxs/prior treatment) Patient is a 32 y.o. female presenting with female genitourinary complaint. The history is provided by the patient.  Female GU Problem Primary symptoms include no pelvic pain and no vaginal bleeding. There has been no fever. Episode onset: lmp in jan. G5P5, last del 2 yrs ago. The problem has not changed since onset.She is pregnant. She has missed her period.    Past Medical History  Diagnosis Date  . Pneumococcal pneumonia     History reviewed. No pertinent past surgical history.  No family history on file.  History  Substance Use Topics  . Smoking status: Not on file  . Smokeless tobacco: Not on file  . Alcohol Use: No    OB History   Grav Para Term Preterm Abortions TAB SAB Ect Mult Living                  Review of Systems  Constitutional: Negative.   Gastrointestinal: Negative.   Genitourinary: Positive for menstrual problem. Negative for vaginal bleeding, vaginal discharge and pelvic pain.    Allergies  Review of patient's allergies indicates no known allergies.  Home Medications   Current Outpatient Rx  Name  Route  Sig  Dispense  Refill  . albuterol (PROVENTIL HFA;VENTOLIN HFA) 108 (90 BASE) MCG/ACT inhaler   Inhalation   Inhale 1 puff into the lungs every 6 (six) hours as needed for wheezing.   1 Inhaler   0   . Prenatal Vitamins (DIS) TABS   Oral   Take 1 tablet by mouth 1 day or 1 dose.   30 tablet   0     BP 106/75  Pulse 78  Temp(Src) 98.5 F (36.9 C) (Oral)  Resp 18  SpO2 100%  Physical Exam  Nursing note and vitals reviewed. Constitutional: She is oriented to person, place, and time. She appears well-developed and well-nourished.  Abdominal:  Soft. Bowel sounds are normal. There is no tenderness.  Neurological: She is alert and oriented to person, place, and time.  Skin: Skin is warm and dry.    ED Course  Procedures (including critical care time)  Labs Reviewed  POCT PREGNANCY, URINE - Abnormal; Notable for the following:    Preg Test, Ur POSITIVE (*)    All other components within normal limits   No results found.   1. Pregnancy       MDM          Linna Hoff, MD 05/04/12 1236

## 2012-05-04 NOTE — ED Notes (Signed)
Pt is here for a poss preg  Unsure if home preg test was pos or neg LMP believes it was around Dec or Jan; they are irreg Sx today include: n/v  She is alert and oriented w/no signs of acute distress.

## 2012-06-04 ENCOUNTER — Other Ambulatory Visit (HOSPITAL_COMMUNITY): Payer: Self-pay | Admitting: Family

## 2012-06-04 DIAGNOSIS — Z3689 Encounter for other specified antenatal screening: Secondary | ICD-10-CM

## 2012-06-04 LAB — OB RESULTS CONSOLE GC/CHLAMYDIA
Chlamydia: NEGATIVE
Gonorrhea: NEGATIVE

## 2012-06-04 LAB — OB RESULTS CONSOLE ANTIBODY SCREEN: Antibody Screen: NEGATIVE

## 2012-06-04 LAB — OB RESULTS CONSOLE GBS: GBS: POSITIVE

## 2012-06-04 LAB — OB RESULTS CONSOLE RPR: RPR: NONREACTIVE

## 2012-06-05 IMAGING — US US RENAL
1 series · 14 of 25 positions shown · non-contrast
Comparison: None.

CLINICAL DATA: Hematuria, 22 weeks pregnant

RENAL/URINARY TRACT ULTRASOUND COMPLETE

[Series 1: us renal · 14 of 29 slices shown]
[im 1/29]
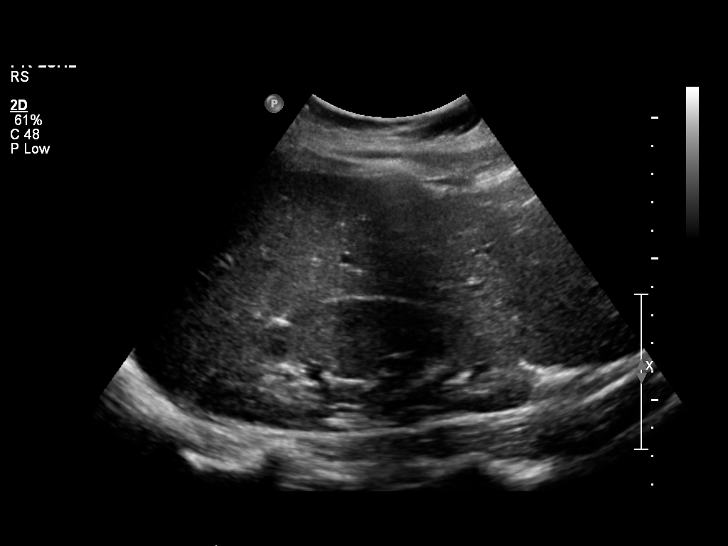
[im 3/29]
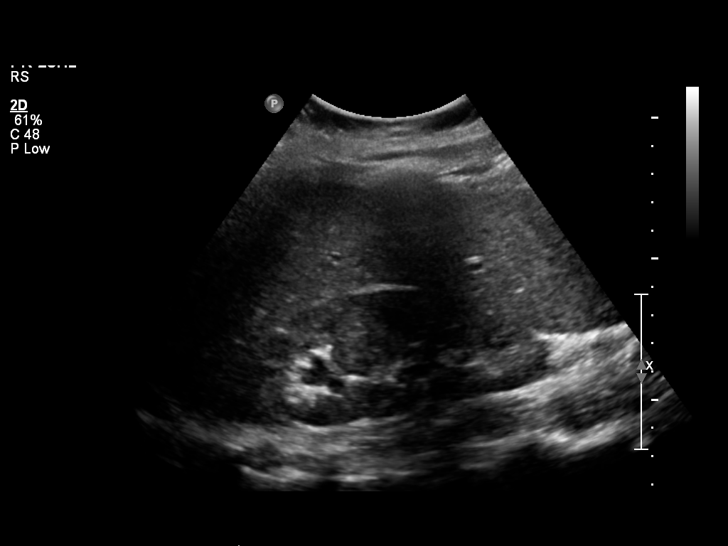
[im 5/29]
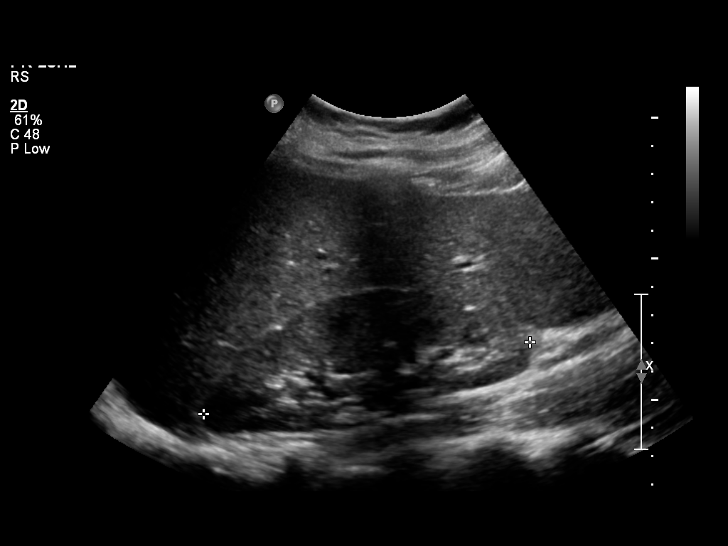
[im 8/29]
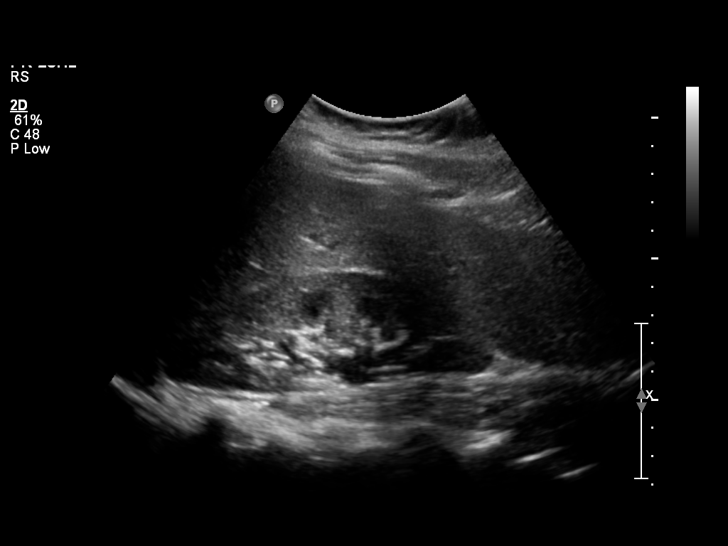
[im 10/29]
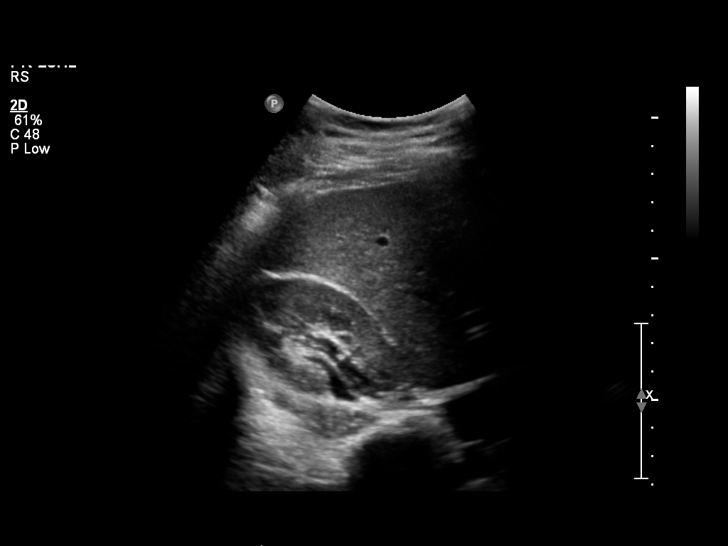
[im 11/29]
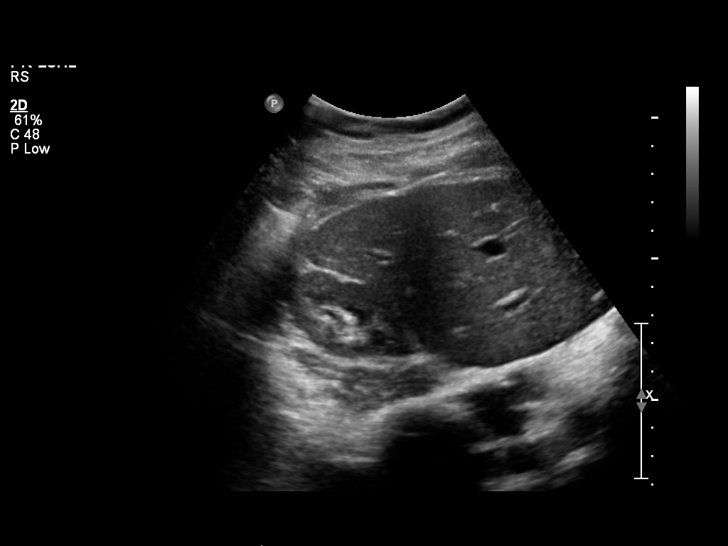
[im 13/29]
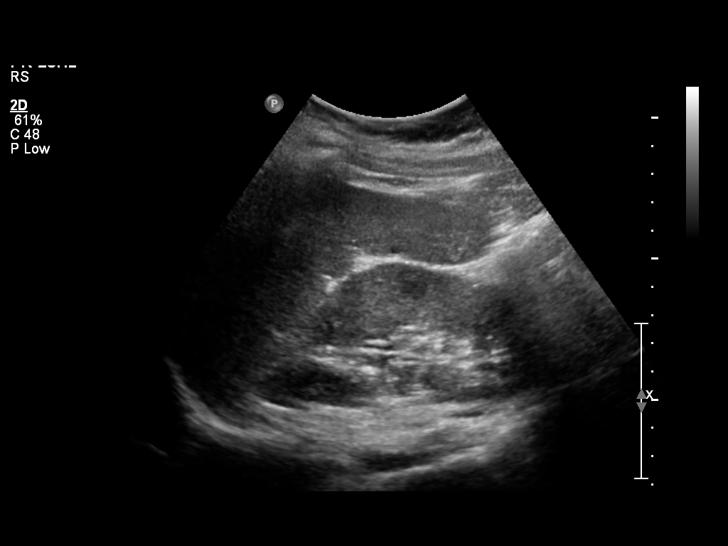
[im 16/29]
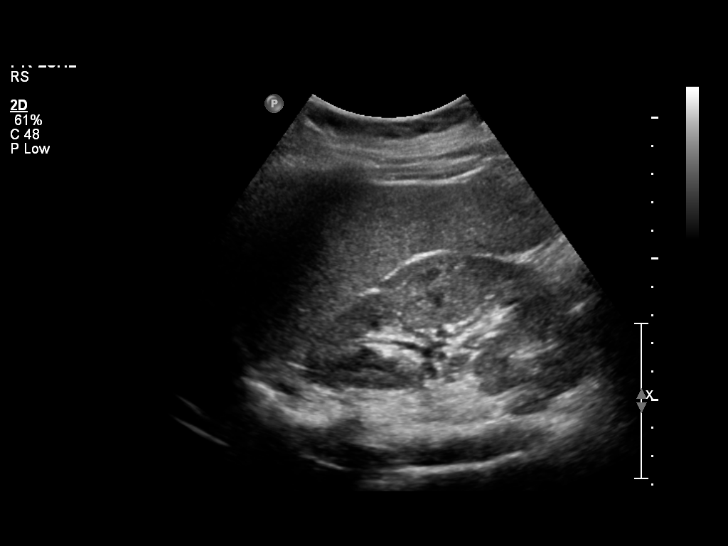
[im 18/29]
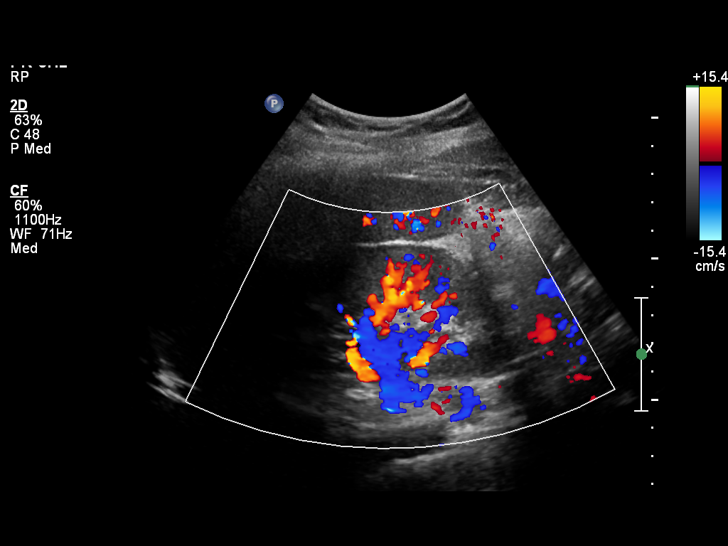
[im 19/29]
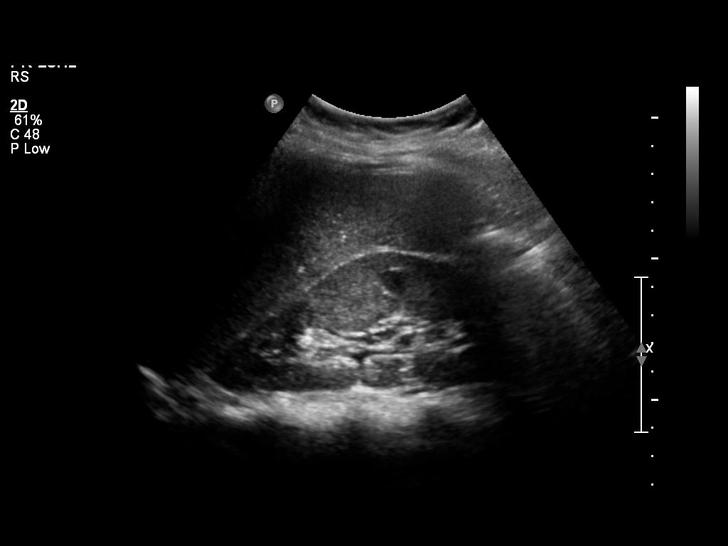
[im 22/29]
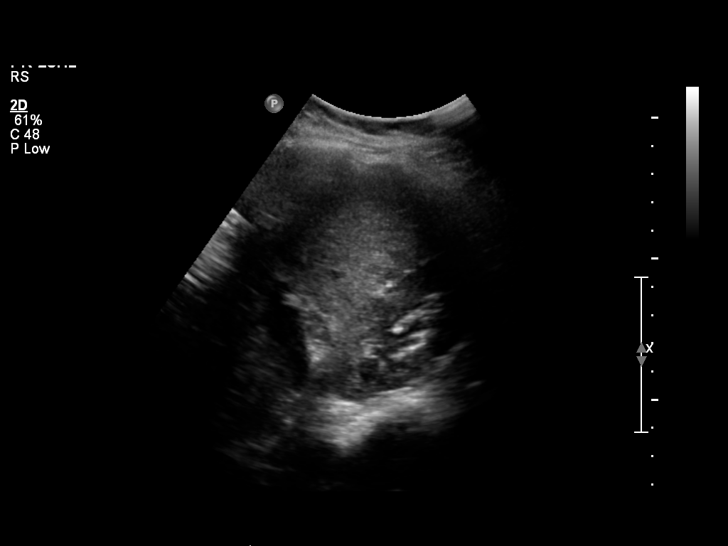
[im 24/29]
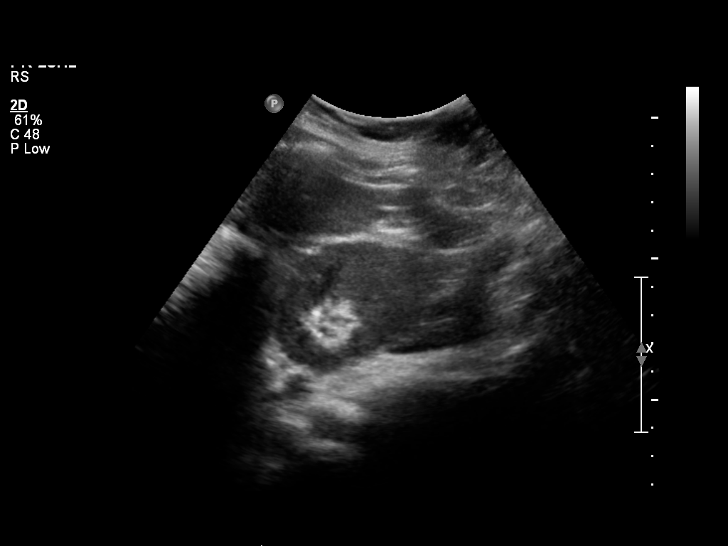
[im 26/29]
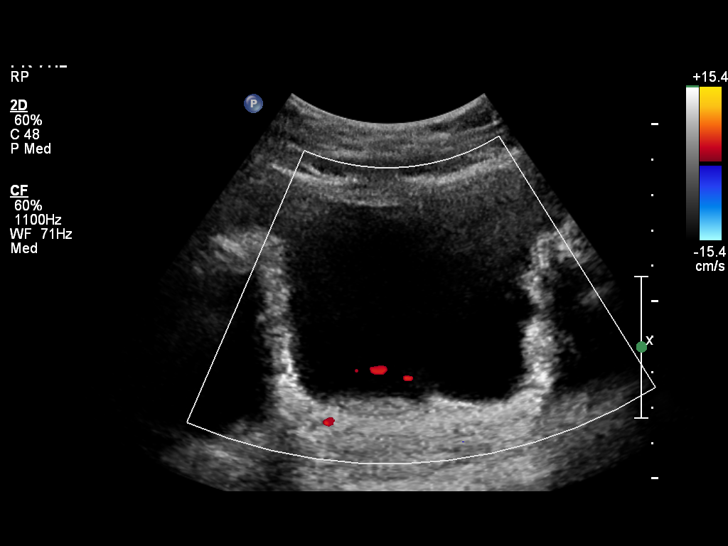
[im 29/29]
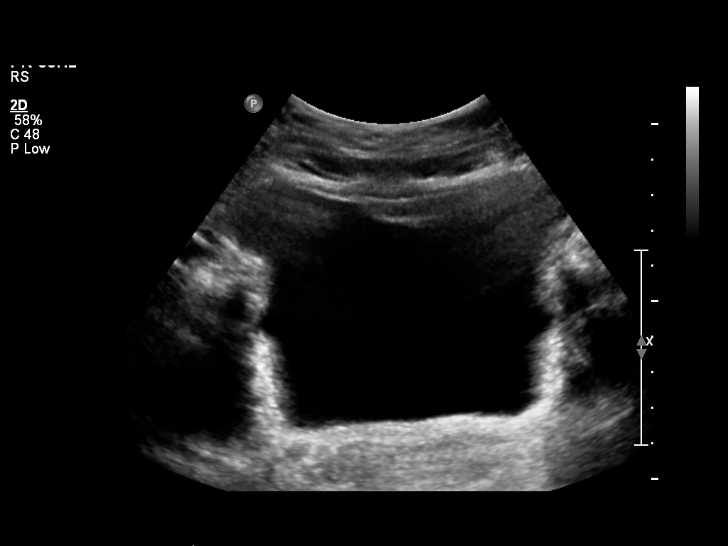

[14 of 25 positions shown; findings below may reference images not displayed]

FINDINGS: Right Kidney:  11.8 cm. No hydronephrosis or focal mass.

Left Kidney:  10.4 cm. No hydronephrosis or focal mass.

Minimal physiologic fullness of the intrarenal collecting systems,
right greater than left, is compatible with changes typical for
pregnancy.

Bladder:  Normal.  Bilateral ureteral jets are visualized.
IMPRESSION: Normal exam.

## 2012-06-07 LAB — OB RESULTS CONSOLE GBS: GBS: POSITIVE

## 2012-06-08 ENCOUNTER — Ambulatory Visit (HOSPITAL_COMMUNITY)
Admission: RE | Admit: 2012-06-08 | Discharge: 2012-06-08 | Disposition: A | Discharge status: Home / Self Care | Payer: Self-pay | Source: Ambulatory Visit | Attending: Family | Admitting: Family

## 2012-06-08 DIAGNOSIS — Z1389 Encounter for screening for other disorder: Secondary | ICD-10-CM | POA: Insufficient documentation

## 2012-06-08 DIAGNOSIS — O358XX0 Maternal care for other (suspected) fetal abnormality and damage, not applicable or unspecified: Secondary | ICD-10-CM | POA: Insufficient documentation

## 2012-06-08 DIAGNOSIS — Z363 Encounter for antenatal screening for malformations: Secondary | ICD-10-CM | POA: Insufficient documentation

## 2012-06-08 DIAGNOSIS — Z3689 Encounter for other specified antenatal screening: Secondary | ICD-10-CM

## 2012-10-01 ENCOUNTER — Encounter (HOSPITAL_COMMUNITY): Payer: Self-pay | Admitting: *Deleted

## 2012-10-01 ENCOUNTER — Telehealth (HOSPITAL_COMMUNITY): Payer: Self-pay | Admitting: *Deleted

## 2012-10-01 NOTE — Telephone Encounter (Signed)
Preadmission screen  

## 2012-10-02 ENCOUNTER — Ambulatory Visit (INDEPENDENT_AMBULATORY_CARE_PROVIDER_SITE_OTHER): Payer: Self-pay | Admitting: *Deleted

## 2012-10-02 VITALS — BP 110/78

## 2012-10-02 DIAGNOSIS — O480 Post-term pregnancy: Secondary | ICD-10-CM

## 2012-10-02 NOTE — Progress Notes (Signed)
P = 89   Pt has next NST @ [facility] on 9/12, then IOL on 9/13 @ 1930. Pt advised to return to hospital for decr. FM or sx of labor.

## 2012-10-03 NOTE — Progress Notes (Signed)
9/9 NST reviewed and reactive

## 2012-10-05 ENCOUNTER — Inpatient Hospital Stay (HOSPITAL_COMMUNITY)
Admission: AD | Admit: 2012-10-05 | Discharge: 2012-10-07 | DRG: 775 | Disposition: A | Discharge status: Home / Self Care | Payer: Medicaid Other | Source: Ambulatory Visit | Attending: Obstetrics & Gynecology | Admitting: Obstetrics & Gynecology

## 2012-10-05 ENCOUNTER — Encounter (HOSPITAL_COMMUNITY): Payer: Self-pay | Admitting: *Deleted

## 2012-10-05 DIAGNOSIS — Z22330 Carrier of Group B streptococcus: Secondary | ICD-10-CM

## 2012-10-05 DIAGNOSIS — O99892 Other specified diseases and conditions complicating childbirth: Secondary | ICD-10-CM | POA: Diagnosis present

## 2012-10-05 DIAGNOSIS — O9989 Other specified diseases and conditions complicating pregnancy, childbirth and the puerperium: Secondary | ICD-10-CM

## 2012-10-05 LAB — CBC
HCT: 34.4 % — ABNORMAL LOW (ref 36.0–46.0)
MCHC: 32.3 g/dL (ref 30.0–36.0)
Platelets: 264 10*3/uL (ref 150–400)
RDW: 15 % (ref 11.5–15.5)
WBC: 10.7 10*3/uL — ABNORMAL HIGH (ref 4.0–10.5)

## 2012-10-05 LAB — RPR: RPR Ser Ql: NONREACTIVE

## 2012-10-05 MED ORDER — LIDOCAINE HCL (PF) 1 % IJ SOLN
30.0000 mL | INTRAMUSCULAR | Status: DC | PRN
  Filled 2012-10-05 (×2): qty 30

## 2012-10-05 MED ORDER — BENZOCAINE-MENTHOL 20-0.5 % EX AERO: 1.0000 "application " | INHALATION_SPRAY | CUTANEOUS | Status: DC | PRN

## 2012-10-05 MED ORDER — SENNOSIDES-DOCUSATE SODIUM 8.6-50 MG PO TABS
2.0000 | ORAL_TABLET | ORAL | Status: DC
  Administered 2012-10-06 – 2012-10-07 (×2): 2 via ORAL

## 2012-10-05 MED ORDER — FLEET ENEMA 7-19 GM/118ML RE ENEM: 1.0000 | ENEMA | RECTAL | Status: DC | PRN

## 2012-10-05 MED ORDER — INFLUENZA VAC SPLIT QUAD 0.5 ML IM SUSP
0.5000 mL | INTRAMUSCULAR | Status: AC
  Administered 2012-10-05: 0.5 mL via INTRAMUSCULAR
  Filled 2012-10-05: qty 0.5

## 2012-10-05 MED ORDER — ONDANSETRON HCL 4 MG/2ML IJ SOLN: 4.0000 mg | INTRAMUSCULAR | Status: DC | PRN

## 2012-10-05 MED ORDER — WITCH HAZEL-GLYCERIN EX PADS: 1.0000 "application " | MEDICATED_PAD | CUTANEOUS | Status: DC | PRN

## 2012-10-05 MED ORDER — DIBUCAINE 1 % RE OINT: 1.0000 "application " | TOPICAL_OINTMENT | RECTAL | Status: DC | PRN

## 2012-10-05 MED ORDER — IBUPROFEN 600 MG PO TABS
600.0000 mg | ORAL_TABLET | Freq: Four times a day (QID) | ORAL | Status: DC | PRN
  Administered 2012-10-05: 600 mg via ORAL
  Filled 2012-10-05: qty 1

## 2012-10-05 MED ORDER — ONDANSETRON HCL 4 MG PO TABS: 4.0000 mg | ORAL_TABLET | ORAL | Status: DC | PRN

## 2012-10-05 MED ORDER — OXYTOCIN 40 UNITS IN LACTATED RINGERS INFUSION - SIMPLE MED
62.5000 mL/h | INTRAVENOUS | Status: DC
  Filled 2012-10-05: qty 1000

## 2012-10-05 MED ORDER — ONDANSETRON HCL 4 MG/2ML IJ SOLN: 4.0000 mg | Freq: Four times a day (QID) | INTRAMUSCULAR | Status: DC | PRN

## 2012-10-05 MED ORDER — LANOLIN HYDROUS EX OINT: TOPICAL_OINTMENT | CUTANEOUS | Status: DC | PRN

## 2012-10-05 MED ORDER — TETANUS-DIPHTH-ACELL PERTUSSIS 5-2.5-18.5 LF-MCG/0.5 IM SUSP: 0.5000 mL | Freq: Once | INTRAMUSCULAR | Status: DC

## 2012-10-05 MED ORDER — ZOLPIDEM TARTRATE 5 MG PO TABS: 5.0000 mg | ORAL_TABLET | Freq: Every evening | ORAL | Status: DC | PRN

## 2012-10-05 MED ORDER — ACETAMINOPHEN 325 MG PO TABS: 650.0000 mg | ORAL_TABLET | ORAL | Status: DC | PRN

## 2012-10-05 MED ORDER — DIPHENHYDRAMINE HCL 25 MG PO CAPS: 25.0000 mg | ORAL_CAPSULE | Freq: Four times a day (QID) | ORAL | Status: DC | PRN

## 2012-10-05 MED ORDER — LACTATED RINGERS IV SOLN
INTRAVENOUS | Status: DC
  Administered 2012-10-05: 09:00:00 via INTRAVENOUS

## 2012-10-05 MED ORDER — CITRIC ACID-SODIUM CITRATE 334-500 MG/5ML PO SOLN: 30.0000 mL | ORAL | Status: DC | PRN

## 2012-10-05 MED ORDER — IBUPROFEN 600 MG PO TABS
600.0000 mg | ORAL_TABLET | Freq: Four times a day (QID) | ORAL | Status: DC
  Administered 2012-10-05 – 2012-10-07 (×8): 600 mg via ORAL
  Filled 2012-10-05 (×8): qty 1

## 2012-10-05 MED ORDER — LACTATED RINGERS IV SOLN: 500.0000 mL | INTRAVENOUS | Status: DC | PRN

## 2012-10-05 MED ORDER — PRENATAL MULTIVITAMIN CH
1.0000 | ORAL_TABLET | Freq: Every day | ORAL | Status: DC
  Administered 2012-10-06 – 2012-10-07 (×2): 1 via ORAL
  Filled 2012-10-05 (×2): qty 1

## 2012-10-05 MED ORDER — OXYTOCIN BOLUS FROM INFUSION
500.0000 mL | INTRAVENOUS | Status: DC
  Administered 2012-10-05: 500 mL via INTRAVENOUS

## 2012-10-05 MED ORDER — OXYCODONE-ACETAMINOPHEN 5-325 MG PO TABS
1.0000 | ORAL_TABLET | ORAL | Status: DC | PRN
  Administered 2012-10-05: 1 via ORAL
  Administered 2012-10-05 – 2012-10-06 (×2): 2 via ORAL
  Filled 2012-10-05 (×2): qty 2
  Filled 2012-10-05: qty 1

## 2012-10-05 MED ORDER — OXYCODONE-ACETAMINOPHEN 5-325 MG PO TABS: 1.0000 | ORAL_TABLET | ORAL | Status: DC | PRN

## 2012-10-05 MED ORDER — SIMETHICONE 80 MG PO CHEW: 80.0000 mg | CHEWABLE_TABLET | ORAL | Status: DC | PRN

## 2012-10-05 NOTE — H&P (Signed)
Sarah Arnold is a 32 y.o. female presenting for labor at term [redacted]w[redacted]d. Painful UC since 9/11 1800, without VB or ROM. GFM>   History OB History   Grav Para Term Preterm Abortions TAB SAB Ect Mult Living   6 5 5       5      Past Medical History  Diagnosis Date  . Pneumococcal pneumonia   . Anemia   . Abnormal Pap smear   . Late prenatal care    Past Surgical History  Procedure Laterality Date  . No past surgeries     Family History: family history is negative for Arthritis, Asthma, Alcohol abuse, Birth defects, Cancer, COPD, Depression, Diabetes, Drug abuse, Early death, Hearing loss, Heart disease, Hyperlipidemia, Hypertension, Kidney disease, Learning disabilities, Mental illness, Mental retardation, Miscarriages / Stillbirths, Stroke, and Vision loss. Social History:  reports that she has never smoked. She has never used smokeless tobacco. She reports that she does not drink alcohol or use illicit drugs.   Prenatal Transfer Tool  Maternal Diabetes: No Genetic Screening: Declined Maternal Ultrasounds/Referrals: Normal Fetal Ultrasounds or other Referrals:  None Maternal Substance Abuse:  No Significant Maternal Medications:  None Significant Maternal Lab Results:  Lab values include: Group B Strep positive Other Comments:  GBS bacteruria treated 2nd tri. Precip delivery, no ABX given  Review of Systems  Constitutional: Negative for fever.  Respiratory: Negative for cough.   Cardiovascular: Negative for chest pain.  Gastrointestinal: Negative for heartburn.  Genitourinary: Negative for dysuria.  Neurological: Negative for headaches.    Dilation: 6.5 Effacement (%): 70 Station: -1 Exam by:: Marijean Heath, RN Blood pressure 128/90, pulse 88, temperature 97.8 F (36.6 C), temperature source Oral, resp. rate 26, height 5\' 4"  (1.626 m), weight 196 lb (88.905 kg), last menstrual period 01/19/2012. Maternal Exam:  Uterine Assessment: Contraction strength is firm.   Contraction frequency is regular.   Abdomen: Patient reports no abdominal tenderness. Estimated fetal weight is 7#.   Fetal presentation: vertex  Introitus: Normal vulva. Ferning test: not done.  Nitrazine test: not done. Amniotic fluid character: not assessed.  Pelvis: adequate for delivery.   Cervix: Cervix evaluated by digital exam.   Cx 6/50/-2 on MAU eval  Fetal Exam Fetal Monitor Review: Mode: ultrasound.   Baseline rate: 140.  Variability: moderate (6-25 bpm).   Pattern: accelerations present and variable decelerations.    Fetal State Assessment: Category II - tracings are indeterminate.     Physical Exam  Constitutional: She is oriented to person, place, and time. She appears well-developed and well-nourished.  HENT:  Head: Normocephalic.  Eyes: Pupils are equal, round, and reactive to light.  Neck: Normal range of motion.  Cardiovascular: Normal rate and regular rhythm.   Respiratory: Effort normal.  GI: Soft.  Genitourinary:  Not examined  Musculoskeletal: Normal range of motion.  Neurological: She is alert and oriented to person, place, and time.  Skin: Skin is dry.  Psychiatric: She has a normal mood and affect. Her behavior is normal. Judgment and thought content normal.    Prenatal labs: ABO, Rh: O/Positive/-- (05/12 0000) Antibody: Negative (05/12 0000) Rubella: Immune (05/12 0000) RPR: Nonreactive (05/12 0000)  HBsAg: Negative (05/12 0000)  HIV: Non-reactive (05/12 0000)  GBS: Positive (05/15 0000)  1 hr 177; 3 hr 77-141-118-123  Assessment/Plan: [id number] at [redacted]w[redacted]d in active labor Cat 2 FHR, variables with head descent H&P done after precipitous birth   Doctor Sheahan 10/05/2012, 10:39 AM

## 2012-10-05 NOTE — MAU Note (Signed)
C/o ucs since 1800 yesterday; G6P5;

## 2012-10-06 ENCOUNTER — Inpatient Hospital Stay (HOSPITAL_COMMUNITY): Admission: RE | Admit: 2012-10-06 | Payer: No Typology Code available for payment source | Source: Ambulatory Visit

## 2012-10-06 NOTE — Progress Notes (Signed)
Post Partum Day 1 Subjective: no complaints, up ad lib, voiding and tolerating PO  Objective: Blood pressure 120/83, pulse 79, temperature 97.6 F (36.4 C), temperature source Oral, resp. rate 18, height 5\' 4"  (1.626 m), weight 88.905 kg (196 lb), last menstrual period 01/19/2012, unknown if currently breastfeeding.  Physical Exam:  General: alert, cooperative and no distress Lochia: appropriate Uterine Fundus: firm Incision: n/a DVT Evaluation: No evidence of DVT seen on physical exam.   Recent Labs  10/05/12 0855  HGB 11.1*  HCT 34.4*    Assessment/Plan: Plan for discharge tomorrow, Breastfeeding, outpatient circumcision and Contraception Nexplanon   LOS: 1 day   Jacquelin Hawking, MD 10/06/2012, 8:52 AM

## 2012-10-07 MED ORDER — OXYCODONE-ACETAMINOPHEN 5-325 MG PO TABS: 1.0000 | ORAL_TABLET | Freq: Four times a day (QID) | ORAL | Status: DC | PRN

## 2012-10-07 MED ORDER — DOCUSATE SODIUM 100 MG PO CAPS: 100.0000 mg | ORAL_CAPSULE | Freq: Two times a day (BID) | ORAL | Status: DC | PRN

## 2012-10-07 MED ORDER — IBUPROFEN 600 MG PO TABS: 600.0000 mg | ORAL_TABLET | Freq: Four times a day (QID) | ORAL | Status: DC

## 2012-10-07 NOTE — Lactation Note (Signed)
This note was copied from the chart of Boy Emmersen Podolsky. Lactation Consultation Note  Patient Name: Boy Clarita Mcelvain [id number] Date: 10/07/2012 Reason for consult: Follow-up assessment Mother is breastfeeding her baby with confidence. She is experienced and her milk has increased in volume with full ducts. Baby is actively feeding with audible swallows and came off the breast satiated. Mother was given a hand pump at request if pumping needed for engorgement or storage.Mother verbalizes how to hand express as well. Nipples tender but intact. Mother states nipples are sore because "he breastfeeds a lot". Advise to latch baby deep on the breast, presently a little shallow, and to express breast milk on nipples post feeding to promote healing. Discussed supply and demand and frequent emptying of the breast to establish milk supply. Mother does not plan to give formula now that her milk is in. Has handout and aware of lactation support and services post d/c.  Maternal Data    Feeding Feeding Type: Breast Milk Length of feed: 10 min  LATCH Score/Interventions Latch: Grasps breast easily, tongue down, lips flanged, rhythmical sucking.  Audible Swallowing: Spontaneous and intermittent  Type of Nipple: Everted at rest and after stimulation  Comfort (Breast/Nipple): Filling, red/small blisters or bruises, mild/mod discomfort  Problem noted: Filling Interventions (Filling): Massage  Hold (Positioning): No assistance needed to correctly position infant at breast.  LATCH Score: 9  Lactation Tools Discussed/Used Pump Review: Setup, frequency, and cleaning;Milk Storage Initiated by:: BDaly Date initiated:: 10/07/12   Consult Status Consult Status: Complete Follow-up type: Call as needed    Omar Person 10/07/2012, 9:35 AM

## 2012-10-07 NOTE — Discharge Summary (Signed)
Obstetric Discharge Summary Reason for Admission: onset of labor Prenatal Procedures: NST Intrapartum Procedures: spontaneous vaginal delivery Postpartum Procedures: none Complications-Operative and Postpartum: none Hemoglobin  Date Value Range Status  10/05/2012 11.1* 12.0 - 15.0 g/dL Final     HCT  Date Value Range Status  10/05/2012 34.4* 36.0 - 46.0 % Final   Patient presented in labor at [redacted]w[redacted]d, had uncomplicated precipitous delivery and postpartum course. Was not able to receive antibiotics for GBS positive status, infant was monitored accordingly.  Breastfeeding, desires Nexplanon for contaception.  Physical Exam:  Temp:  [97.6 F (36.4 C)-98.5 F (36.9 C)] 98.5 F (36.9 C) (09/14 0610) Pulse Rate:  [79-90] 86 (09/14 0610) Resp:  [18-100] 100 (09/14 0610) BP: (110-120)/(71-83) 110/71 mmHg (09/14 0610) General: alert and no distress Lochia: appropriate Uterine Fundus: firm DVT Evaluation: No evidence of DVT seen on physical exam. Negative Homan's sign.  Discharge Diagnoses: Term Pregnancy-delivered  Discharge Information: Date: 10/07/2012 Activity: pelvic rest and refer to discharge instructions Diet: routine Medications: PNV, Ibuprofen, Colace and Percocet Condition: stable Instructions: refer to discharge instructions Discharge to: home Follow-up Information   Follow up with [facility] [place]. Schedule an appointment as soon as possible for a visit in 6 weeks. (For postpartum visit)    Contact information:   [address] [place] [address] [phone]     Newborn Data: Live born female  Birth Weight: 7 lb 14 oz (3572 g) APGAR: 9, 9  Home with mother.  Rand Etchison A 10/07/2012, 7:47 AM

## 2012-10-11 NOTE — Progress Notes (Signed)
I have seen and examined this patient and I agree with the above. Cam Hai 8:57 AM 10/11/2012

## 2012-11-28 ENCOUNTER — Encounter: Payer: Self-pay | Admitting: *Deleted

## 2012-12-25 ENCOUNTER — Emergency Department (INDEPENDENT_AMBULATORY_CARE_PROVIDER_SITE_OTHER): Payer: Self-pay

## 2012-12-25 ENCOUNTER — Emergency Department (INDEPENDENT_AMBULATORY_CARE_PROVIDER_SITE_OTHER)
Admission: EM | Admit: 2012-12-25 | Discharge: 2012-12-25 | Disposition: A | Discharge status: Home / Self Care | Payer: Self-pay | Source: Home / Self Care | Attending: Family Medicine | Admitting: Family Medicine

## 2012-12-25 ENCOUNTER — Emergency Department (HOSPITAL_COMMUNITY): Payer: Self-pay

## 2012-12-25 ENCOUNTER — Encounter (HOSPITAL_COMMUNITY): Payer: Self-pay | Admitting: Emergency Medicine

## 2012-12-25 DIAGNOSIS — J8410 Pulmonary fibrosis, unspecified: Secondary | ICD-10-CM

## 2012-12-25 DIAGNOSIS — J849 Interstitial pulmonary disease, unspecified: Secondary | ICD-10-CM

## 2012-12-25 MED ORDER — ALBUTEROL SULFATE (5 MG/ML) 0.5% IN NEBU
INHALATION_SOLUTION | RESPIRATORY_TRACT | Status: AC
  Filled 2012-12-25: qty 1

## 2012-12-25 MED ORDER — PREDNISONE 50 MG PO TABS: 50.0000 mg | ORAL_TABLET | Freq: Every day | ORAL | Status: DC

## 2012-12-25 MED ORDER — AMOXICILLIN 500 MG PO CAPS: 500.0000 mg | ORAL_CAPSULE | Freq: Three times a day (TID) | ORAL | Status: DC

## 2012-12-25 MED ORDER — ALBUTEROL SULFATE HFA 108 (90 BASE) MCG/ACT IN AERS
2.0000 | INHALATION_SPRAY | Freq: Four times a day (QID) | RESPIRATORY_TRACT | Status: DC | PRN
  Administered 2012-12-25: 2 via RESPIRATORY_TRACT

## 2012-12-25 MED ORDER — IPRATROPIUM BROMIDE 0.02 % IN SOLN
0.5000 mg | Freq: Once | RESPIRATORY_TRACT | Status: AC
  Administered 2012-12-25: 0.5 mg via RESPIRATORY_TRACT

## 2012-12-25 MED ORDER — ALBUTEROL SULFATE HFA 108 (90 BASE) MCG/ACT IN AERS
INHALATION_SPRAY | RESPIRATORY_TRACT | Status: AC
  Filled 2012-12-25: qty 6.7

## 2012-12-25 MED ORDER — IPRATROPIUM BROMIDE 0.02 % IN SOLN
RESPIRATORY_TRACT | Status: AC
  Filled 2012-12-25: qty 2.5

## 2012-12-25 MED ORDER — ALBUTEROL SULFATE (5 MG/ML) 0.5% IN NEBU
5.0000 mg | INHALATION_SOLUTION | Freq: Once | RESPIRATORY_TRACT | Status: AC
  Administered 2012-12-25: 5 mg via RESPIRATORY_TRACT

## 2012-12-25 NOTE — ED Provider Notes (Signed)
Sarah Arnold is a 32 y.o. female who presents to Urgent Care today for cough congestion sore throat wheezing. The symptoms have been present off and on for 1-2 months. She's been seen by her primary care provider who provided her with antibiotics which helped briefly. She denies any significant shortness of breath. She denies any nausea vomiting or diarrhea. She is 2 months postpartum following a normal pregnancy. She is from [place], however she has been living in the [place] for 13 years. She denies any history of asthma, or smoking. She did a lot of cooking on a charcoal, and keroseine fire growing up.   She is currently breast-feeding Past Medical History  Diagnosis Date  . Pneumococcal pneumonia   . Anemia   . Abnormal Pap smear   . Late prenatal care    History  Substance Use Topics  . Smoking status: Never Smoker   . Smokeless tobacco: Never Used  . Alcohol Use: No   ROS as above Medications reviewed. Current Facility-Administered Medications  Medication Dose Route Frequency Provider Last Rate Last Dose  . albuterol (PROVENTIL HFA;VENTOLIN HFA) 108 (90 BASE) MCG/ACT inhaler 2 puff  2 puff Inhalation Q6H PRN Rodolph Bong, MD       Current Outpatient Prescriptions  Medication Sig Dispense Refill  . amoxicillin (AMOXIL) 500 MG capsule Take 1 capsule (500 mg total) by mouth 3 (three) times daily.  21 capsule  0  . predniSONE (DELTASONE) 50 MG tablet Take 1 tablet (50 mg total) by mouth daily.  5 tablet  0  . Prenatal Vit-Fe Fumarate-FA (PRENATAL MULTIVITAMIN) TABS tablet Take 1 tablet by mouth daily at 12 noon.        Exam:  BP 118/80  Pulse 84  Temp(Src) 98.5 F (36.9 C) (Oral)  Resp 14  SpO2 100% Gen: Well NAD HEENT: EOMI,  MMM Lungs: Normal work of breathing. Wheezing present bilateral lower lungs with expiratory phase.  Heart: RRR no MRG Abd: NABS, Soft. NT, ND Exts: Non edematous BL  LE, warm and well perfused.   Patient was given a DuoNeb nebulizer  treatment, and did not receive much benefit. However her lung exam improved with a complete reduction of wheezing.   No results found for this or any previous visit (from the past 24 hour(s)). Dg Chest 2 View  12/25/2012   CLINICAL DATA:  Cough.  EXAM: CHEST  2 VIEW  COMPARISON:  August 15, 2011.  FINDINGS: Cardiomediastinal silhouette appears normal. No pleural effusion or pneumothorax is noted. Diffuse reticulonodular densities are seen throughout both lungs which were present on prior exam and most likely represents chronic scarring or fibrosis. Bony thorax is intact.  IMPRESSION: Diffuse reticulonodular densities seen throughout both lungs which were present on prior exam and most likely represent chronic scarring or fibrosis, or possibly chronic interstitial pneumonitis or histoplasmosis.   Electronically Signed   By: Roque Lias M.D.   On: 12/25/2012 10:43    Assessment and Plan: 32 y.o. female with chronic cough and wheezing due to likely interstitial lung disease. Unclear etiology. This may be histoplasmosis or as result of her childhood.  Plan: [facility] to [facility] with hopeful referral to pulmonology.  Additionally we'll treat with prednisone amoxicillin and albuterol. Discussed warning signs or symptoms. Please see discharge instructions. Patient expresses understanding.      Rodolph Bong, MD 12/25/12 [phone]

## 2012-12-25 NOTE — ED Notes (Signed)
Pt  Reports  Symptoms  Of  Cough  /  Congested      sorethroat  Stuffy nose      X  1  Month    She  Is  Sitting  Upright on  Exam table  Speaking in  Complete  sentances  And  Is  In no  Acute  distress  At this  Time     She  Reports  Symptoms  Not  releived  By OTC  meds

## 2013-01-08 ENCOUNTER — Ambulatory Visit: Payer: No Typology Code available for payment source | Attending: Internal Medicine

## 2013-01-21 ENCOUNTER — Encounter (HOSPITAL_COMMUNITY): Payer: Self-pay

## 2013-01-22 ENCOUNTER — Ambulatory Visit (HOSPITAL_COMMUNITY)
Admission: RE | Admit: 2013-01-22 | Discharge: 2013-01-22 | Disposition: A | Discharge status: Home / Self Care | Payer: No Typology Code available for payment source | Source: Ambulatory Visit | Attending: Obstetrics and Gynecology | Admitting: Obstetrics and Gynecology

## 2013-01-22 ENCOUNTER — Encounter (HOSPITAL_COMMUNITY): Payer: Self-pay

## 2013-01-22 VITALS — BP 106/60 | Temp 98.3°F | Ht 64.0 in | Wt 180.0 lb

## 2013-01-22 DIAGNOSIS — IMO0002 Reserved for concepts with insufficient information to code with codable children: Secondary | ICD-10-CM

## 2013-01-22 DIAGNOSIS — Z1239 Encounter for other screening for malignant neoplasm of breast: Secondary | ICD-10-CM

## 2013-01-22 DIAGNOSIS — R87612 Low grade squamous intraepithelial lesion on cytologic smear of cervix (LGSIL): Secondary | ICD-10-CM | POA: Insufficient documentation

## 2013-01-22 NOTE — Patient Instructions (Signed)
Taught [name] how to perform BSE and gave educational materials to take home. Patient did not need a Pap smear today due to last Pap smear was 12/18/2012 and abnormal. Referred patient to the [facility] for a colposcopy for follow up. Sarah Arnold will call patient with follow up appointment time and date. Screening mammogram recommended at age 32 unless clinically indicated prior. Misbah Haxton verbalized understanding.  Andriel Omalley, Kathaleen Maser, RN 10:26 AM

## 2013-01-22 NOTE — Progress Notes (Signed)
Patient referred to [facility] by the [facility] Department due to a LSIL on Pap smear 12/17/2012 needing a colposcopy for follow up.  Pap Smear:  Pap smear not completed today. Last Pap smear was 12/18/2012 at the [facility] and LSIL. Referred patient to the [facility] for a colposcopy for follow up. Martie Lee will call patient with follow up appointment time and date. 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 Pap smear result is scanned in EPIC under media.  Physical exam: Breasts Breasts symmetrical. No skin abnormalities bilateral breasts. No nipple retraction bilateral breasts. No nipple discharge bilateral breasts. No lymphadenopathy. No lumps palpated bilateral breasts. No complaints of pain or tenderness on exam. Screening mammogram at age 48 unless clinically indicated prior.     Pelvic/Bimanual No Pap smear completed today since last Pap smear was 12/18/2012. Pap smear not indicated per [facility] guidelines.

## 2013-02-11 ENCOUNTER — Encounter: Payer: Self-pay | Admitting: Internal Medicine

## 2013-02-11 ENCOUNTER — Ambulatory Visit: Payer: No Typology Code available for payment source | Attending: Internal Medicine | Admitting: Internal Medicine

## 2013-02-11 VITALS — BP 112/79 | HR 84 | Temp 98.7°F | Resp 16 | Ht 64.0 in | Wt 183.0 lb

## 2013-02-11 DIAGNOSIS — R0602 Shortness of breath: Secondary | ICD-10-CM | POA: Insufficient documentation

## 2013-02-11 DIAGNOSIS — R059 Cough, unspecified: Secondary | ICD-10-CM | POA: Insufficient documentation

## 2013-02-11 DIAGNOSIS — R05 Cough: Secondary | ICD-10-CM

## 2013-02-11 HISTORY — DX: Shortness of breath: R06.02

## 2013-02-11 HISTORY — DX: Cough, unspecified: R05.9

## 2013-02-11 LAB — COMPLETE METABOLIC PANEL WITH GFR
ALT: 17 U/L (ref 0–35)
AST: 14 U/L (ref 0–37)
Albumin: 4.3 g/dL (ref 3.5–5.2)
Alkaline Phosphatase: 77 U/L (ref 39–117)
BUN: 14 mg/dL (ref 6–23)
CALCIUM: 9.7 mg/dL (ref 8.4–10.5)
CO2: 25 meq/L (ref 19–32)
Chloride: 103 mEq/L (ref 96–112)
Creat: 0.9 mg/dL (ref 0.50–1.10)
GFR, Est Non African American: 85 mL/min
Glucose, Bld: 93 mg/dL (ref 70–99)
POTASSIUM: 4.4 meq/L (ref 3.5–5.3)
SODIUM: 139 meq/L (ref 135–145)
Total Bilirubin: 0.4 mg/dL (ref 0.3–1.2)
Total Protein: 7.6 g/dL (ref 6.0–8.3)

## 2013-02-11 LAB — CBC WITH DIFFERENTIAL/PLATELET
Basophils Absolute: 0 10*3/uL (ref 0.0–0.1)
Basophils Relative: 0 % (ref 0–1)
Eosinophils Absolute: 0.8 10*3/uL — ABNORMAL HIGH (ref 0.0–0.7)
Eosinophils Relative: 11 % — ABNORMAL HIGH (ref 0–5)
HCT: 40.1 % (ref 36.0–46.0)
Hemoglobin: 13 g/dL (ref 12.0–15.0)
LYMPHS ABS: 1.9 10*3/uL (ref 0.7–4.0)
LYMPHS PCT: 28 % (ref 12–46)
MCH: 25.7 pg — ABNORMAL LOW (ref 26.0–34.0)
MCHC: 32.4 g/dL (ref 30.0–36.0)
MCV: 79.4 fL (ref 78.0–100.0)
Monocytes Absolute: 0.8 10*3/uL (ref 0.1–1.0)
Monocytes Relative: 11 % (ref 3–12)
NEUTROS ABS: 3.4 10*3/uL (ref 1.7–7.7)
Neutrophils Relative %: 50 % (ref 43–77)
Platelets: 327 10*3/uL (ref 150–400)
RBC: 5.05 MIL/uL (ref 3.87–5.11)
RDW: 15 % (ref 11.5–15.5)
WBC: 6.8 10*3/uL (ref 4.0–10.5)

## 2013-02-11 LAB — SEDIMENTATION RATE: Sed Rate: 18 mm/hr (ref 0–22)

## 2013-02-11 NOTE — Progress Notes (Signed)
Pt here to establish care Abnormal chest xray suggestive of PNA/ fibrosis C/o intermit cough without pain or fever. Pt treated with atb's and steroids-completed Denies fever,chills,n,v

## 2013-02-11 NOTE — Progress Notes (Signed)
Patient ID: Sarah Arnold, female   DOB: 04/21/1980, 33 y.o.   MRN: [id number] Patient Demographics  Sarah Arnold, is a 33 y.o. female  [id number]  [id number]  DOB - 08/13/1980  CC:  Chief Complaint  Patient presents with  . Establish Care  . Cough       HPI: Sarah Arnold is a 33 y.o. female here today to establish medical care. Patient is a mother of 6 children, last child about 4 months ago came in today to establish medical care. Her major complaint is cough and shortness of breath followup since about 2-3 months. Cough is productive of mainly whitish sputum, denies any hemoptysis, no chest pain. She was seen in the urgent care recently and prescribed amoxicillin and steroid. Chest x-ray shows possibility of fibrosis which was also seen on previous x-ray one year ago. Patient has no contact with any patient with chronic cough, she complains of excessive salivation. She denies any weight loss, no headache no visual impairment. Patient does not smoke cigarette, she does not drink alcohol. She used to work as a Engineer, watercleaner at [employer], she was exposed to chemicals for cleaning but for only one year. She lives at home with her husband, [place] both immig[place] to [place] from [place].  Patient has No headache, No chest pain, No abdominal pain - No Nausea, No new weakness tingling or numbness, No Cough - SOB.  No Known Allergies Past Medical History  Diagnosis Date  . Pneumococcal pneumonia   . Anemia   . Abnormal Pap smear   . Late prenatal care    Current Outpatient Prescriptions on File Prior to Visit  Medication Sig Dispense Refill  . amoxicillin (AMOXIL) 500 MG capsule Take 1 capsule (500 mg total) by mouth 3 (three) times daily.  21 capsule  0  . predniSONE (DELTASONE) 50 MG tablet Take 1 tablet (50 mg total) by mouth daily.  5 tablet  0  . Prenatal Vit-Fe Fumarate-FA (PRENATAL MULTIVITAMIN) TABS tablet Take 1 tablet by mouth daily at 12 noon.        No current facility-administered medications on file prior to visit.   Family History  Problem Relation Age of Onset  . Arthritis Neg Hx   . Asthma Neg Hx   . Alcohol abuse Neg Hx   . Birth defects Neg Hx   . Cancer Neg Hx   . COPD Neg Hx   . Depression Neg Hx   . Diabetes Neg Hx   . Drug abuse Neg Hx   . Early death Neg Hx   . Hearing loss Neg Hx   . Heart disease Neg Hx   . Hyperlipidemia Neg Hx   . Hypertension Neg Hx   . Kidney disease Neg Hx   . Learning disabilities Neg Hx   . Mental illness Neg Hx   . Mental retardation Neg Hx   . Miscarriages / Stillbirths Neg Hx   . Stroke Neg Hx   . Vision loss Neg Hx    History   Social History  . Marital Status: Married    Spouse Name: N/A    Number of Children: N/A  . Years of Education: N/A   Occupational History  . Not on file.   Social History Main Topics  . Smoking status: Never Smoker   . Smokeless tobacco: Never Used  . Alcohol Use: No  . Drug Use: No  . Sexual Activity: Yes    Birth Control/ Protection: Implant  Other Topics Concern  . Not on file   Social History Narrative  . No narrative on file    Review of Systems: Constitutional: Negative for fever, chills, diaphoresis, activity change, appetite change and fatigue. HENT: Negative for ear pain, nosebleeds, congestion, facial swelling, rhinorrhea, neck pain, neck stiffness and ear discharge.  Eyes: Negative for pain, discharge, redness, itching and visual disturbance. Respiratory: ++ cough, choking, no chest tightness, ++shortness of breath, wheezing  Cardiovascular: Negative for chest pain, palpitations and leg swelling. Gastrointestinal: Negative for abdominal distention. Genitourinary: Negative for dysuria, urgency, frequency, hematuria, flank pain, decreased urine volume, difficulty urinating and dyspareunia.  Musculoskeletal: Negative for back pain, joint swelling, arthralgia and gait problem. Neurological: Negative for dizziness, tremors,  seizures, syncope, facial asymmetry, speech difficulty, weakness, light-headedness, numbness and headaches.  Hematological: Negative for adenopathy. Does not bruise/bleed easily. Psychiatric/Behavioral: Negative for hallucinations, behavioral problems, confusion, dysphoric mood, decreased concentration and agitation.    Objective:   Filed Vitals:   02/11/13 0913  BP: 112/79  Pulse: 84  Temp: 98.7 F (37.1 C)  Resp: 16    Physical Exam: Constitutional: Patient appears well-developed and well-nourished. No distress. HENT: Normocephalic, atraumatic, External right and left ear normal. Oropharynx is clear and moist.  Eyes: Conjunctivae and EOM are normal. PERRLA, no scleral icterus. Neck: Normal ROM. Neck supple. No JVD. No tracheal deviation. No thyromegaly. CVS: RRR, S1/S2 +, no murmurs, no gallops, no carotid bruit.  Pulmonary: Reduced air entry on the right lower lobe especially posteriorly with fine crackles/crepitations and minimal wheezing Abdominal: Soft. BS +, no distension, tenderness, rebound or guarding.  Musculoskeletal: Had 2 nodular growth on left index finger at the distal interphalangeal joints. Normal range of motion. No edema and no tenderness.  Lymphadenopathy: No lymphadenopathy noted, cervical, inguinal or axillary Neuro: Alert. Normal reflexes, muscle tone coordination. No cranial nerve deficit. Skin: Skin is warm and dry. No rash noted. Not diaphoretic. No erythema. No pallor. Psychiatric: Normal mood and affect. Behavior, judgment, thought content normal.  Lab Results  Component Value Date   WBC 10.7* 10/05/2012   HGB 11.1* 10/05/2012   HCT 34.4* 10/05/2012   MCV 79.1 10/05/2012   PLT 264 10/05/2012   Lab Results  Component Value Date   CREATININE 1.20* 08/15/2011   BUN 18 08/15/2011   NA 142 08/15/2011   K 3.6 08/15/2011   CL 104 08/15/2011   CO2 22 06/03/2009    No results found for this basename: HGBA1C   Lipid Panel     Component Value Date/Time    CHOL 182 06/03/2009 2046   TRIG 98 06/03/2009 2046   HDL 60 06/03/2009 2046   CHOLHDL 3.0 Ratio 06/03/2009 2046   VLDL 20 06/03/2009 2046   LDLCALC 102* 06/03/2009 2046       Assessment and plan:   1. Cough, chronic These on the previous 2 chest x-rays, I will continue CT to rule out pulmonary fibrosis or interstitial lung disease. - CT Chest W Contrast; Future  2. Shortness of breath - Sedimentation Rate - CBC with Differential - COMPLETE METABOLIC PANEL WITH GFR - ANA - Anti-DNA antibody, double-stranded - Angiotensin converting enzyme - ANCA screen with reflex titer  Follow up in 3 months or when necessary   The patient was given clear instructions to go to ER or return to medical center if symptoms don't improve, worsen or new problems develop. The patient verbalized understanding. The patient was told to call to get lab results if they haven't heard anything  in the next week.     Jeanann Lewandowsky, MD, MHA, [facility] [place], [place] [phone]   02/11/2013, 9:44 AM

## 2013-02-12 LAB — ANA: Anti Nuclear Antibody(ANA): NEGATIVE

## 2013-02-12 LAB — ANTI-DNA ANTIBODY, DOUBLE-STRANDED: ds DNA Ab: 1 IU/mL (ref <30)

## 2013-02-12 LAB — ANGIOTENSIN CONVERTING ENZYME: Angiotensin-Converting Enzyme: 97 U/L — ABNORMAL HIGH (ref 8–52)

## 2013-02-13 ENCOUNTER — Ambulatory Visit (HOSPITAL_COMMUNITY)
Admission: RE | Admit: 2013-02-13 | Discharge: 2013-02-13 | Disposition: A | Discharge status: Home / Self Care | Payer: No Typology Code available for payment source | Source: Ambulatory Visit | Attending: Internal Medicine | Admitting: Internal Medicine

## 2013-02-13 ENCOUNTER — Encounter (HOSPITAL_COMMUNITY): Payer: Self-pay

## 2013-02-13 DIAGNOSIS — R05 Cough: Secondary | ICD-10-CM

## 2013-02-13 DIAGNOSIS — R0602 Shortness of breath: Secondary | ICD-10-CM

## 2013-02-13 DIAGNOSIS — J479 Bronchiectasis, uncomplicated: Secondary | ICD-10-CM | POA: Insufficient documentation

## 2013-02-13 DIAGNOSIS — R059 Cough, unspecified: Secondary | ICD-10-CM

## 2013-02-13 LAB — ANCA SCREEN W REFLEX TITER
Atypical p-ANCA Screen: NEGATIVE
c-ANCA Screen: NEGATIVE
p-ANCA Screen: NEGATIVE

## 2013-02-13 MED ORDER — IOHEXOL 300 MG/ML  SOLN
80.0000 mL | Freq: Once | INTRAMUSCULAR | Status: AC | PRN
  Administered 2013-02-13: 80 mL via INTRAVENOUS

## 2013-02-13 NOTE — Addendum Note (Signed)
Addended by: Jeanann LewandowskyJEGEDE, Lenette Rau E on: 02/13/2013 05:12 PM   Modules accepted: Orders

## 2013-02-14 ENCOUNTER — Telehealth: Payer: Self-pay

## 2013-02-14 NOTE — Telephone Encounter (Signed)
Message copied by Lestine MountJUAREZ, Hopelynn Gartland L on Thu Feb 14, 2013 10:14 AM ------      Message from: Jeanann LewandowskyJEGEDE, OLUGBEMIGA E      Created: Wed Feb 13, 2013  5:13 PM       Please call to inform patient that her CAT scan report shows possibility of chronic infections in the lungs, we will refer her to a pulmonologist. Already ordered ------

## 2013-02-14 NOTE — Telephone Encounter (Signed)
Patient is aware of her CT results And will be referred to pulmonology

## 2013-03-06 ENCOUNTER — Institutional Professional Consult (permissible substitution): Payer: Self-pay | Admitting: Emergency Medicine

## 2013-03-14 ENCOUNTER — Other Ambulatory Visit (INDEPENDENT_AMBULATORY_CARE_PROVIDER_SITE_OTHER): Payer: No Typology Code available for payment source

## 2013-03-14 ENCOUNTER — Encounter: Payer: Self-pay | Admitting: Emergency Medicine

## 2013-03-14 ENCOUNTER — Ambulatory Visit (INDEPENDENT_AMBULATORY_CARE_PROVIDER_SITE_OTHER): Payer: No Typology Code available for payment source | Admitting: Emergency Medicine

## 2013-03-14 VITALS — BP 120/84 | HR 85 | Ht 64.0 in | Wt 184.0 lb

## 2013-03-14 DIAGNOSIS — J479 Bronchiectasis, uncomplicated: Secondary | ICD-10-CM | POA: Insufficient documentation

## 2013-03-14 LAB — CBC WITH DIFFERENTIAL/PLATELET
BASOS PCT: 0.3 % (ref 0.0–3.0)
Basophils Absolute: 0 10*3/uL (ref 0.0–0.1)
EOS PCT: 8.5 % — AB (ref 0.0–5.0)
Eosinophils Absolute: 0.6 10*3/uL (ref 0.0–0.7)
HCT: 41.6 % (ref 36.0–46.0)
Hemoglobin: 13.2 g/dL (ref 12.0–15.0)
LYMPHS PCT: 17 % (ref 12.0–46.0)
Lymphs Abs: 1.2 10*3/uL (ref 0.7–4.0)
MCHC: 31.7 g/dL (ref 30.0–36.0)
MCV: 79.2 fl (ref 78.0–100.0)
MONO ABS: 0.6 10*3/uL (ref 0.1–1.0)
MONOS PCT: 8.1 % (ref 3.0–12.0)
NEUTROS PCT: 66.1 % (ref 43.0–77.0)
Neutro Abs: 4.5 10*3/uL (ref 1.4–7.7)
Platelets: 311 10*3/uL (ref 150.0–400.0)
RBC: 5.26 Mil/uL — AB (ref 3.87–5.11)
RDW: 14.4 % (ref 11.5–14.6)
WBC: 6.9 10*3/uL (ref 4.5–10.5)

## 2013-03-14 NOTE — Assessment & Plan Note (Addendum)
Etiology unclear. DDx is broad. I do not know of any particular addition considerations to make since she was from [place] except for risk for HIV and possible TB. Likelihood of CF very low. Consider ciliary disease (would likely need to be dx by nasal mucosal bx), a1-AT def, ABPA.  - need sputum cx's for bacterial fungal AFB - ABPA screen - IgE - a1-AT - quant gold - CBC and IgG subclasses. - consider FOB for organisms if unable to produce acceptable sputum.  - rov 1

## 2013-03-14 NOTE — Patient Instructions (Signed)
Lab work today We will attempt to get a sputum sample to be brought to the lab for cultures.  Follow with Dr Delton CoombesByrum in 1 month

## 2013-03-14 NOTE — Progress Notes (Signed)
Subjective:    Patient ID: Sarah Arnold, female    DOB: [birth year], 33 y.o.   MRN: [id number]  HPI 33 yo never smoker, referred for evaluation for chronic rhinitis and cough. This became more problematic about 4-5 months ago when she was pregnant with her 6th child. She delivered in 9/15 but has continued to experience progressive yellow mucous production in addition to her chronic rhinitis. She was seen in the ED 3 weeks ago and then referred here.   Autoimmune screen was performed 02/11/13 by Dr Doreene Burke > negative ESR, ANA, ANCA, dsDNA. Her ACE level was elevated at 97. HIV 07/2011 > negative. No known TB exposure.   CT chest 02/13/13 > severe bronchiectasis, peribronchovascular GGI and micronodular disease. No honeycombing.    Denies Fevers, Chills, Malaise. Denies sweats.  Review of Systems  Constitutional: Negative for fever and unexpected weight change.  HENT: Positive for congestion and postnasal drip. Negative for dental problem, ear pain, nosebleeds, rhinorrhea, sinus pressure, sneezing, sore throat and trouble swallowing.   Eyes: Negative for redness and itching.  Respiratory: Positive for cough. Negative for chest tightness, shortness of breath and wheezing.   Cardiovascular: Negative for palpitations and leg swelling.  Gastrointestinal: Negative for nausea and vomiting.  Genitourinary: Negative for dysuria.  Musculoskeletal: Negative for joint swelling.  Skin: Negative for rash.  Neurological: Negative for headaches.  Psychiatric/Behavioral: Negative for dysphoric mood. The patient is not nervous/anxious.        Objective:   Physical Exam Filed Vitals:   03/14/13 1124  BP: 120/84  Pulse: 85  Height: _0  (1.626 m)  Weight: 184 lb (83.462 kg)  SpO2: 96%   Gen: Pleasant, well-nourished, in no distress,  normal affect  ENT: No lesions,  mouth clear,  oropharynx clear, no postnasal drip  Neck: No JVD, no TMG, no carotid bruits  Lungs: No use of accessory muscles,  no dullness to percussion, clear without rales or rhonchi  Cardiovascular: RRR, heart sounds normal, no murmur or gallops, no peripheral edema  Musculoskeletal: No deformities, no cyanosis or clubbing  Neuro: alert, non focal  Skin: Warm, no lesions or rashes    02/13/13 CT chest  COMPARISON: No prior chest CTs. Chest x-ray 12/25/2012.  FINDINGS:  Study is limited by considerable patient respiratory motion.  Mediastinum: Heart size is normal. There is no significant  pericardial fluid, thickening or pericardial calcification. No  pathologically enlarged mediastinal or hilar lymph nodes, although  there is some prominent lymphoid tissue in the hilar regions and  subcarinal region which cannot be discretely measured.  Lungs/Pleura: There is extensive cylindrical and varicose  bronchiectasis throughout all aspects of the lungs bilaterally, most  severe in the lower lobes. Extensive thickening of the  peribronchovascular interstitium is noted diffusely. In the areas of  most severe involvement there is also extensive peribronchovascular  micro and macronodularity, predominantly ground-glass attenuation in  appearance. There are some more focal areas of architectural  distortion in the posterior aspect of the right upper lobe and  periphery of the right lower lobe, favored to represent chronic post  infectious or inflammatory scarring. In the remainder the lungs  there are no significant areas of subpleural reticulation,  parenchymal banding or frank honeycombing to strongly suggest an  interstitial lung disease. Inspiratory and expiratory imaging is  unremarkable.  Upper Abdomen: Unremarkable.  Musculoskeletal: There are no aggressive appearing lytic or blastic  lesions noted in the visualized portions of the skeleton.  IMPRESSION:  1. The appearance of  the lungs is not compatible with an underlying  interstitial lung disease. Rather, there is extensive cylindrical  and varicose  bronchiectasis with presumed chronic infectious and  inflammatory changes throughout the lungs bilaterally (described  above). This can be seen in the setting of chronic indolent atypical  infectious process such as MAI (mycobacterium avium intracellulare),  or other atypical mycobacteria.       Assessment & Plan:  Bronchiectasis without acute exacerbation Etiology unclear. DDx is broad. I do not know of any particular addition considerations to make since she was from [place] except for risk for HIV and possible TB. Likelihood of CF very low. Consider ciliary disease, a1-AT def, ABPA.  - need sputum cx's for bacterial fungal AFB - ABPA screen - IgE - a1-AT - quant gold - CBC and IgG subclasses. - consider FOB for organisms if unable to produce acceptable sputum.  - rov 1

## 2013-03-15 LAB — CYSTIC FIBROSIS DIAGNOSTIC STUDY

## 2013-03-15 LAB — IGE: IGE (IMMUNOGLOBULIN E), SERUM: 933.4 [IU]/mL — AB (ref 0.0–180.0)

## 2013-03-18 LAB — QUANTIFERON TB GOLD ASSAY (BLOOD)
INTERFERON GAMMA RELEASE ASSAY: NEGATIVE
MITOGEN VALUE: 5.78 [IU]/mL
Quantiferon Nil Value: 0.07 IU/mL
Quantiferon Tb Ag Minus Nil Value: 0.1 IU/mL
TB Ag value: 0.17 IU/mL

## 2013-03-19 LAB — IGG 1, 2, 3, AND 4
IGG SUBCLASS 2: 612 mg/dL (ref 241–700)
IGG SUBCLASS 4: 68.4 mg/dL (ref 4.0–86.0)
IgG (Immunoglobin G), Serum: 1570 mg/dL (ref 690–1700)
IgG Subclass 1: 1031 mg/dL — ABNORMAL HIGH (ref 382–929)
IgG Subclass 3: 145 mg/dL (ref 22–178)

## 2013-03-23 LAB — FUNGAL ANTIBODIES PANEL, ID-BLOOD
Aspergillus Flavus Antibodies: NEGATIVE
Aspergillus Niger Antibodies: NEGATIVE
Aspergillus fumigatus: NEGATIVE
BLASTOMYCES ABS, QN, DID: NEGATIVE
Coccidioides Antibody ID: NEGATIVE
Histoplasma Antibody, ID: NEGATIVE

## 2013-03-25 ENCOUNTER — Other Ambulatory Visit: Payer: No Typology Code available for payment source

## 2013-03-25 DIAGNOSIS — J479 Bronchiectasis, uncomplicated: Secondary | ICD-10-CM

## 2013-03-26 LAB — ALPHA-1 ANTITRYPSIN PHENOTYPE: A-1 Antitrypsin: 126 mg/dL (ref 83–199)

## 2013-03-28 LAB — RESPIRATORY CULTURE OR RESPIRATORY AND SPUTUM CULTURE
GRAM STAIN: NONE SEEN
GRAM STAIN: NONE SEEN
Gram Stain: NONE SEEN
ORGANISM ID, BACTERIA: NORMAL

## 2013-04-05 ENCOUNTER — Other Ambulatory Visit (HOSPITAL_COMMUNITY)
Admission: RE | Admit: 2013-04-05 | Discharge: 2013-04-05 | Disposition: A | Discharge status: Home / Self Care | Payer: No Typology Code available for payment source | Source: Ambulatory Visit | Attending: Family Medicine | Admitting: Family Medicine

## 2013-04-05 ENCOUNTER — Encounter: Payer: Self-pay | Admitting: Family Medicine

## 2013-04-05 ENCOUNTER — Ambulatory Visit (INDEPENDENT_AMBULATORY_CARE_PROVIDER_SITE_OTHER): Payer: Self-pay | Admitting: Family Medicine

## 2013-04-05 ENCOUNTER — Encounter: Payer: Self-pay | Admitting: *Deleted

## 2013-04-05 VITALS — BP 134/92 | HR 86 | Temp 98.2°F | Ht 64.0 in | Wt 182.2 lb

## 2013-04-05 DIAGNOSIS — R6889 Other general symptoms and signs: Secondary | ICD-10-CM

## 2013-04-05 DIAGNOSIS — N72 Inflammatory disease of cervix uteri: Secondary | ICD-10-CM | POA: Insufficient documentation

## 2013-04-05 DIAGNOSIS — Z01812 Encounter for preprocedural laboratory examination: Secondary | ICD-10-CM

## 2013-04-05 DIAGNOSIS — N870 Mild cervical dysplasia: Secondary | ICD-10-CM | POA: Insufficient documentation

## 2013-04-05 DIAGNOSIS — IMO0002 Reserved for concepts with insufficient information to code with codable children: Secondary | ICD-10-CM

## 2013-04-05 LAB — POCT PREGNANCY, URINE: Preg Test, Ur: NEGATIVE

## 2013-04-05 NOTE — Progress Notes (Signed)
Gen:  WD, WN, NAD, A&Ox3, pleasant LAB: HCG today is negative Colposcopy Procedure Note  Procedure: Colposcopy Consent: The risks and benefits of the procedure were discussed with the patient. Risks include acute and chronic abdominal and/or pelvic pain, infection (minimized by sterile technique), bleeding (internal and external), and need for subsequent procedure to correct a complication or further treat symptoms. Rarely, damage to internal structures may occur requiring subsequent procedures or, exceedingly rare, repair or removal of the uterus. Benefits may include further evaluation and diagnosis of cervical pathology. The alternatives were explained to the patient including: not doing procedure or postponing procedure. The disadvantages to not doing the procedure were discussed with the patent, including missed diagnosis of cervical cancer The written consent form has been signed and placed in the patient's medical record The patient voiced understanding of the procedure and agreed to proceed. Indication: Cervical dysplasia work up per ASCCP Guidelines Physicians: Dr. Jolyn LentMichael Teigan Sahli Description in detail:   Chaperone present for exam Ladona Ridgelaylor A timeout was completed before the start of the procedure - site verified and documented in the chart.  External genitalia w/o visible lesions. Speculum exam revealed normal appearing cervix.  The cervix was examined using high powered microscopy  Acetic acid applied to cervix - see graphic in this note for findings  The entire transformation zone / squamocolumnar junction was visualized.  Biopsies completed @ 7 and 12 oclock  ECC completed EBL: Minimal, <523mL; hemostasis achieved prior to removing speculum Complications: None; Pt. tolerated procedure well. Instructions to patient: Return to clinic vs. ER, depending on severity, with fevers,   increasing pain, or difficulty walking. Expect mild soreness and spotting for one day.  Call clinic with other  questions. Post-procedure handout given to patient. Nothing in vagina for two weeks. Follow up plan: LGSIL on PAP, now s/p colposcopy. Will call pt with results and follow ASCCP guidelines. NOTE: Bx and ECC pending

## 2013-04-05 NOTE — Patient Instructions (Signed)
Abnormal Pap Test Information During a Pap test, the cells on the surface of your cervix are checked to see if they look normal, abnormal, or if they show signs of having been altered by a certain type of virus called human papillomavirus, or HPV. Cervical cells that have been affected by HPV are called dysplasia. Dysplasia is not cancer, but describes abnormal cells found on the surface of the cervix. Depending on the degree of dysplasia, some of the cells may be considered pre-cancerous and may turn into cancer over time if follow up with a caregiver is delayed.  WHAT DOES AN ABNORMAL PAP TEST MEAN? Having an abnormal pap test does not mean that you have cancer. However, certain types of abnormal pap tests can be a sign that a person is at a higher risk of developing cancer. Your caregiver will want to do other tests to find out more about the abnormal cells. Your abnormal Pap test results could show:   Small and uncertain changes that should be carefully watched.   Cervical dysplasia that has caused mild changes and can be followed over time.  Cervical dysplasia that is more severe and needs to be followed and treated to ensure the problem goes away.  Cancer.  When severe cervical dysplasia is found and treated early, it rarely will grow into cancer.  WHAT WILL BE DONE ABOUT MY ABNORMAL PAP TEST?  A colposcopy may be needed. This is a procedure where your cervix is examined using light and magnification.  A small tissue sample of your cervix (biopsy) may need to be removed and then examined. This is often performed if there are areas that appear infected.  A sample of cells from the cervical canal may be removed with either a small brush or scraping instrument (curette). Based on the results of the procedures above, some caregivers may recommend either cryotherapy of the cervix or a surgical LEEP where a portion of the cervix is removed. LEEP is short for "loop electrical excisional  procedure." Rarely, a caregiver may recommend a cone biopsy.This is a procedure where a small, cone-shaped sample of your cervix is taken out. The part that is taken out is the area where the abnormal cells are.  WHAT IF I HAVE A DYSPLASIA OR A CANCER? You may be referred to a specialist. Radiation may also be a treatment for more advanced cancer. Having a hysterectomy is the last treatment option for dysplasia, but it is a more common treatment for someone with cancer. All treatment options will be discussed with you by your caregiver. WHAT SHOULD YOU DO AFTER BEING TREATED? If you have had an abnormal pap test, you should continue to have regular pap tests and check-ups as directed by your caregiver. Your cervical problem will be carefully watched so it does not get worse. Also, your caregiver can watch for, and treat, any new problems that may come up. Document Released: 04/27/2010 Document Revised: 05/07/2012 Document Reviewed: 01/06/2011 Exit[employer] Patient Information 2014 [employer].  

## 2013-04-09 ENCOUNTER — Encounter: Payer: Self-pay | Admitting: Family Medicine

## 2013-04-09 ENCOUNTER — Telehealth: Payer: Self-pay

## 2013-04-09 NOTE — Telephone Encounter (Signed)
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  Pt stated understanding with no further questions.

## 2013-04-09 NOTE — Telephone Encounter (Signed)
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 Need to repeat PAP with cotesting in 12 months to ensure it is not part of 5% that will progress to severe disease.            Tawana ScaleMichael Ryan Odom, MD      OB Fellow       ------

## 2013-04-10 ENCOUNTER — Ambulatory Visit (INDEPENDENT_AMBULATORY_CARE_PROVIDER_SITE_OTHER): Payer: No Typology Code available for payment source | Admitting: Emergency Medicine

## 2013-04-10 ENCOUNTER — Encounter: Payer: Self-pay | Admitting: Emergency Medicine

## 2013-04-10 VITALS — BP 140/82 | HR 88 | Ht 64.0 in | Wt 188.0 lb

## 2013-04-10 DIAGNOSIS — J309 Allergic rhinitis, unspecified: Secondary | ICD-10-CM | POA: Insufficient documentation

## 2013-04-10 DIAGNOSIS — J479 Bronchiectasis, uncomplicated: Secondary | ICD-10-CM

## 2013-04-10 HISTORY — DX: Allergic rhinitis, unspecified: J30.9

## 2013-04-10 NOTE — Assessment & Plan Note (Signed)
Trial nasal steroid

## 2013-04-10 NOTE — Progress Notes (Signed)
Subjective:    Patient ID: Sarah Arnold, female    DOB: May 21, 1980, 33 y.o.   MRN: [id number]  HPI 33 yo never smoker, referred for evaluation for chronic rhinitis and cough. This became more problematic about 4-5 months ago when she was pregnant with her 32th child. She delivered in 9/15 but has continued to experience progressive yellow mucous production in addition to her chronic rhinitis. She was seen in the ED 3 weeks ago and then referred here.   Autoimmune screen was performed 02/11/13 by Dr Doreene Burke > negative ESR, ANA, ANCA, dsDNA. Her ACE level was elevated at 97. HIV 07/2011 > negative. No known TB exposure.   CT chest 02/13/13 > severe bronchiectasis, peribronchovascular GGI and micronodular disease. No honeycombing.   ROV 04/10/13 -- follows for cough and severe bronchiectasis and micronodular disease. She is still coughing, produces sputum. No SOB. She does have nasal congestion.   Results: sputum AFB negative, fungal negative Quant Gold > negative IgE > 933 Aspergillus fungal panel >> negative    Denies Fevers, Chills, Malaise. Denies sweats.  Review of Systems  Constitutional: Negative for fever and unexpected weight change.  HENT: Positive for congestion and postnasal drip. Negative for dental problem, ear pain, nosebleeds, rhinorrhea, sinus pressure, sneezing, sore throat and trouble swallowing.   Eyes: Negative for redness and itching.  Respiratory: Positive for cough. Negative for chest tightness, shortness of breath and wheezing.   Cardiovascular: Negative for palpitations and leg swelling.  Gastrointestinal: Negative for nausea and vomiting.  Genitourinary: Negative for dysuria.  Musculoskeletal: Negative for joint swelling.  Skin: Negative for rash.  Neurological: Negative for headaches.  Psychiatric/Behavioral: Negative for dysphoric mood. The patient is not nervous/anxious.        Objective:   Physical Exam Filed Vitals:   04/10/13 1005  BP: 140/82   Pulse: 88  Height: _0  (1.626 m)  Weight: 188 lb (85.276 kg)  SpO2: 98%   Gen: Pleasant, well-nourished, in no distress,  normal affect  ENT: No lesions,  mouth clear,  oropharynx clear, no postnasal drip  Neck: No JVD, no TMG, no carotid bruits  Lungs: No use of accessory muscles, no dullness to percussion, clear without rales or rhonchi  Cardiovascular: RRR, heart sounds normal, no murmur or gallops, no peripheral edema  Musculoskeletal: No deformities, no cyanosis or clubbing  Neuro: alert, non focal  Skin: Warm, no lesions or rashes   02/13/13 CT chest  COMPARISON: No prior chest CTs. Chest x-ray 12/25/2012.  FINDINGS:  Study is limited by considerable patient respiratory motion.  Mediastinum: Heart size is normal. There is no significant  pericardial fluid, thickening or pericardial calcification. No  pathologically enlarged mediastinal or hilar lymph nodes, although  there is some prominent lymphoid tissue in the hilar regions and  subcarinal region which cannot be discretely measured.  Lungs/Pleura: There is extensive cylindrical and varicose  bronchiectasis throughout all aspects of the lungs bilaterally, most  severe in the lower lobes. Extensive thickening of the  peribronchovascular interstitium is noted diffusely. In the areas of  most severe involvement there is also extensive peribronchovascular  micro and macronodularity, predominantly ground-glass attenuation in  appearance. There are some more focal areas of architectural  distortion in the posterior aspect of the right upper lobe and  periphery of the right lower lobe, favored to represent chronic post  infectious or inflammatory scarring. In the remainder the lungs  there are no significant areas of subpleural reticulation,  parenchymal banding or frank honeycombing  to strongly suggest an  interstitial lung disease. Inspiratory and expiratory imaging is  unremarkable.  Upper Abdomen: Unremarkable.   Musculoskeletal: There are no aggressive appearing lytic or blastic  lesions noted in the visualized portions of the skeleton.  IMPRESSION:  1. The appearance of the lungs is not compatible with an underlying  interstitial lung disease. Rather, there is extensive cylindrical  and varicose bronchiectasis with presumed chronic infectious and  inflammatory changes throughout the lungs bilaterally (described  above). This can be seen in the setting of chronic indolent atypical  infectious process such as MAI (mycobacterium avium intracellulare),  or other atypical mycobacteria.       Assessment & Plan:  Allergic rhinitis Trial nasal steroid  Bronchiectasis without acute exacerbation - will perform aspergillus skin testing - if the above is negative and the cx remain negative then we will set up FOB - rov next avail

## 2013-04-10 NOTE — Patient Instructions (Signed)
We will perform skin testing today If your cultures all stay negative and your skin testing is negative then we will arrange for a bronchoscopy.  Follow with Dr Delton CoombesByrum in 1 month

## 2013-04-10 NOTE — Assessment & Plan Note (Addendum)
-   will perform aspergillus skin testing - if the above is negative and the cx remain negative then we will set up FOB - rov next avail

## 2013-04-16 ENCOUNTER — Encounter (HOSPITAL_COMMUNITY): Payer: Self-pay

## 2013-04-17 ENCOUNTER — Ambulatory Visit (HOSPITAL_COMMUNITY)
Admission: RE | Admit: 2013-04-17 | Discharge: 2013-04-17 | Disposition: A | Discharge status: Home / Self Care | Payer: No Typology Code available for payment source | Source: Ambulatory Visit | Attending: Emergency Medicine | Admitting: Emergency Medicine

## 2013-04-17 ENCOUNTER — Encounter (HOSPITAL_COMMUNITY)
Admission: RE | Disposition: A | Discharge status: Home / Self Care | Payer: Self-pay | Source: Ambulatory Visit | Attending: Emergency Medicine

## 2013-04-17 DIAGNOSIS — J8410 Pulmonary fibrosis, unspecified: Secondary | ICD-10-CM | POA: Insufficient documentation

## 2013-04-17 DIAGNOSIS — J479 Bronchiectasis, uncomplicated: Secondary | ICD-10-CM | POA: Insufficient documentation

## 2013-04-17 DIAGNOSIS — J309 Allergic rhinitis, unspecified: Secondary | ICD-10-CM | POA: Insufficient documentation

## 2013-04-17 HISTORY — PX: VIDEO BRONCHOSCOPY: SHX5072

## 2013-04-17 SURGERY — VIDEO BRONCHOSCOPY WITHOUT FLUORO
Anesthesia: Moderate Sedation | Laterality: Bilateral

## 2013-04-17 MED ORDER — MIDAZOLAM HCL 10 MG/2ML IJ SOLN
INTRAMUSCULAR | Status: DC | PRN
  Administered 2013-04-17: 1 mg via INTRAVENOUS
  Administered 2013-04-17: 3 mg via INTRAVENOUS
  Administered 2013-04-17: 2 mg via INTRAVENOUS

## 2013-04-17 MED ORDER — FENTANYL CITRATE 0.05 MG/ML IJ SOLN
INTRAMUSCULAR | Status: AC
  Filled 2013-04-17: qty 4

## 2013-04-17 MED ORDER — PHENYLEPHRINE HCL 0.25 % NA SOLN
NASAL | Status: DC | PRN
  Administered 2013-04-17: 2 via NASAL

## 2013-04-17 MED ORDER — FENTANYL CITRATE 0.05 MG/ML IJ SOLN
INTRAMUSCULAR | Status: DC | PRN
  Administered 2013-04-17 (×2): 25 ug via INTRAVENOUS
  Administered 2013-04-17: 50 ug via INTRAVENOUS

## 2013-04-17 MED ORDER — LIDOCAINE HCL 2 % EX GEL
Freq: Once | CUTANEOUS | Status: DC
  Filled 2013-04-17: qty 5

## 2013-04-17 MED ORDER — SODIUM CHLORIDE 0.9 % IV SOLN
INTRAVENOUS | Status: DC
  Administered 2013-04-17: 08:00:00 via INTRAVENOUS

## 2013-04-17 MED ORDER — LIDOCAINE HCL 2 % EX GEL
CUTANEOUS | Status: DC | PRN
  Administered 2013-04-17: 2

## 2013-04-17 MED ORDER — MIDAZOLAM HCL 10 MG/2ML IJ SOLN
INTRAMUSCULAR | Status: AC
  Filled 2013-04-17: qty 4

## 2013-04-17 MED ORDER — LIDOCAINE HCL 1 % IJ SOLN
INTRAMUSCULAR | Status: DC | PRN
  Administered 2013-04-17: 2 mL

## 2013-04-17 MED ORDER — PHENYLEPHRINE HCL 0.25 % NA SOLN
1.0000 | Freq: Four times a day (QID) | NASAL | Status: DC | PRN
  Filled 2013-04-17: qty 15

## 2013-04-17 NOTE — Interval H&P Note (Signed)
PCCM Interval Note  Pt presents with her husband today for BAL / FOB No new complaints, still has productive cough Filed Vitals:   04/17/13 0745 04/17/13 0750 04/17/13 0755 04/17/13 0800  BP: 136/106 140/75 144/72 144/91  Pulse: 83 88 87 89  Resp: 27 26 18 20   SpO2: 100% 100% 71% 100%   Plans:  Will proceed with FOB for culture data  Levy Pupaobert Zayleigh Stroh, MD, PhD 04/17/2013, 8:22 AM [facility] [phone] or if no answer [phone]

## 2013-04-17 NOTE — H&P (View-Only) (Signed)
 Subjective:    Patient ID: Sarah Arnold, female    DOB: 12/08/1980, 33 y.o.   MRN: [id number]  HPI 33 yo never smoker, referred for evaluation for chronic rhinitis and cough. This became more problematic about 4-5 months ago when she was pregnant with her 6th child. She delivered in 9/15 but has continued to experience progressive yellow mucous production in addition to her chronic rhinitis. She was seen in the ED 3 weeks ago and then referred here.   Autoimmune screen was performed 02/11/13 by Dr Jegede > negative ESR, ANA, ANCA, dsDNA. Her ACE level was elevated at 97. HIV 07/2011 > negative. No known TB exposure.   CT chest 02/13/13 > severe bronchiectasis, peribronchovascular GGI and micronodular disease. No honeycombing.   ROV 04/10/13 -- follows for cough and severe bronchiectasis and micronodular disease. She is still coughing, produces sputum. No SOB. She does have nasal congestion.   Results: sputum AFB negative, fungal negative Quant Gold > negative IgE > 933 Aspergillus fungal panel >> negative    Denies Fevers, Chills, Malaise. Denies sweats.  Review of Systems  Constitutional: Negative for fever and unexpected weight change.  HENT: Positive for congestion and postnasal drip. Negative for dental problem, ear pain, nosebleeds, rhinorrhea, sinus pressure, sneezing, sore throat and trouble swallowing.   Eyes: Negative for redness and itching.  Respiratory: Positive for cough. Negative for chest tightness, shortness of breath and wheezing.   Cardiovascular: Negative for palpitations and leg swelling.  Gastrointestinal: Negative for nausea and vomiting.  Genitourinary: Negative for dysuria.  Musculoskeletal: Negative for joint swelling.  Skin: Negative for rash.  Neurological: Negative for headaches.  Psychiatric/Behavioral: Negative for dysphoric mood. The patient is not nervous/anxious.        Objective:   Physical Exam Filed Vitals:   04/10/13 1005  BP: 140/82   Pulse: 88  Height: 5' 4" (1.626 m)  Weight: 188 lb (85.276 kg)  SpO2: 98%   Gen: Pleasant, well-nourished, in no distress,  normal affect  ENT: No lesions,  mouth clear,  oropharynx clear, no postnasal drip  Neck: No JVD, no TMG, no carotid bruits  Lungs: No use of accessory muscles, no dullness to percussion, clear without rales or rhonchi  Cardiovascular: RRR, heart sounds normal, no murmur or gallops, no peripheral edema  Musculoskeletal: No deformities, no cyanosis or clubbing  Neuro: alert, non focal  Skin: Warm, no lesions or rashes   02/13/13 CT chest  COMPARISON: No prior chest CTs. Chest x-ray 12/25/2012.  FINDINGS:  Study is limited by considerable patient respiratory motion.  Mediastinum: Heart size is normal. There is no significant  pericardial fluid, thickening or pericardial calcification. No  pathologically enlarged mediastinal or hilar lymph nodes, although  there is some prominent lymphoid tissue in the hilar regions and  subcarinal region which cannot be discretely measured.  Lungs/Pleura: There is extensive cylindrical and varicose  bronchiectasis throughout all aspects of the lungs bilaterally, most  severe in the lower lobes. Extensive thickening of the  peribronchovascular interstitium is noted diffusely. In the areas of  most severe involvement there is also extensive peribronchovascular  micro and macronodularity, predominantly ground-glass attenuation in  appearance. There are some more focal areas of architectural  distortion in the posterior aspect of the right upper lobe and  periphery of the right lower lobe, favored to represent chronic post  infectious or inflammatory scarring. In the remainder the lungs  there are no significant areas of subpleural reticulation,  parenchymal banding or frank honeycombing   to strongly suggest an  interstitial lung disease. Inspiratory and expiratory imaging is  unremarkable.  Upper Abdomen: Unremarkable.   Musculoskeletal: There are no aggressive appearing lytic or blastic  lesions noted in the visualized portions of the skeleton.  IMPRESSION:  1. The appearance of the lungs is not compatible with an underlying  interstitial lung disease. Rather, there is extensive cylindrical  and varicose bronchiectasis with presumed chronic infectious and  inflammatory changes throughout the lungs bilaterally (described  above). This can be seen in the setting of chronic indolent atypical  infectious process such as MAI (mycobacterium avium intracellulare),  or other atypical mycobacteria.       Assessment & Plan:  Allergic rhinitis Trial nasal steroid  Bronchiectasis without acute exacerbation - will perform aspergillus skin testing - if the above is negative and the cx remain negative then we will set up FOB - rov next avail    

## 2013-04-17 NOTE — Op Note (Signed)
Video Bronchoscopy Procedure Note  Date of Operation: 04/17/2013  Pre-op Diagnosis: bronchiectasis   Post-op Diagnosis: Same  Surgeon: Levy PupaOBERT Anndrea Mihelich  Assistants: none  Anesthesia: conscious sedation, moderate sedation  Meds Given: fentanyl 100mcg, versed 6mg  in divided doses, 1% lidocaine 25cc total  Operation: Flexible video fiberoptic bronchoscopy and biopsies.  Estimated Blood Loss: none  Complications: none noted  Indications and History: Sarah Arnold is 33 y.o. with history of cough and bronchiectasis on CT scan.  Recommendation was to perform video fiberoptic bronchoscopy with lavage. The risks, benefits, complications, treatment options and expected outcomes were discussed with the patient.  The possibilities of pneumothorax, pneumonia, reaction to medication, pulmonary aspiration, perforation of a viscus, bleeding, failure to diagnose a condition and creating a complication requiring transfusion or operation were discussed with the patient who freely signed the consent.    Description of Procedure: The patient was seen in the Preoperative Area, was examined and was deemed appropriate to proceed.  [facility], identified as Tayva Mika and the procedure verified as Flexible Video Fiberoptic Bronchoscopy.  A Time Out was held and the above information confirmed.   Conscious sedation was initiated as indicated above. The video fiberoptic bronchoscope was introduced via the R nare and a general inspection was performed which showed normal cords, normal trachea, normal main carina. The R sided airways were inspected and showed normal RUL, BI, RML. There was some very subtle hypopigmented nodularity on the RLL airways. Endobronchial forceps biopsies were performed in the RLL. The L side was then inspected. The LLL, Lingular and LUL airways were normal. There were some stringy white secretions present but no purulent mucous. Finally bronchoalveolar  lavage was performed in the RUL with 60cc NS instilled and approximately 25cc returned to be sent for cytology and microbiology. The patient tolerated the procedure well. The bronchoscope was removed. There were no obvious complications.   Samples: 1. Endobronchial forceps biopsies from RLL 2. Bronchioalveolar lavage from the RUL  Plans:  We will review the cytology, pathology and microbiology results with the patient when they become available.  Outpatient followup will be with Dr Delton CoombesByrum.    Levy Pupaobert Statia Burdick, MD, PhD 04/17/2013, 8:51 AM  [facility] [phone] or if no answer [phone]

## 2013-04-17 NOTE — Discharge Instructions (Signed)
Flexible Bronchoscopy, Care After These instructions give you information on caring for yourself after your procedure. Your doctor may also give you more specific instructions. Call your doctor if you have any problems or questions after your procedure. HOME CARE  Do not eat or drink anything for 2 hours after your procedure. If you try to eat or drink before the medicine wears off, food or drink could go into your lungs. You could also burn yourself.  After 2 hours have passed and when you can cough and gag normally, you may eat soft food and drink liquids slowly.  The day after the test, you may eat your normal diet.  You may do your normal activities.  Keep all doctor visits. GET HELP RIGHT AWAY IF:  You get more and more short of breath.  You get lightheaded.  You feel like you are going to pass out (faint).  You have chest pain.  You have new problems that worry you.  You cough up more than a little blood.  You cough up more blood than before. MAKE SURE YOU:  Understand these instructions.  Will watch your condition.  Will get help right away if you are not doing well or get worse. Document Released: 11/07/2008 Document Revised: 10/31/2012 Document Reviewed: 09/14/2012 [facility] Patient Information 2014 [place], [place].   DO NOT EAT OR DRINK ANYTHING UNTIL   11:00am   TODAY MARCH 25,2015  Please call our office for any questions or concerns [phone].

## 2013-04-17 NOTE — Progress Notes (Signed)
Video Bronchoscopy done  Intervention Bronchial washing done Intervention Bronchial biopsy  Procedure tolerated well  Levy Pupaobert Dani Wallner, MD, PhD 04/17/2013, 11:34 PM [facility] [phone] or if no answer [phone]

## 2013-04-18 ENCOUNTER — Encounter (HOSPITAL_COMMUNITY): Payer: Self-pay | Admitting: Emergency Medicine

## 2013-04-19 LAB — CULTURE, BAL-QUANTITATIVE: Colony Count: 85000

## 2013-04-19 LAB — CULTURE, BAL-QUANTITATIVE W GRAM STAIN: Special Requests: NORMAL

## 2013-04-22 LAB — FUNGUS CULTURE W SMEAR: Smear Result: NONE SEEN

## 2013-04-24 ENCOUNTER — Telehealth: Payer: Self-pay | Admitting: Emergency Medicine

## 2013-04-24 NOTE — Telephone Encounter (Signed)
Reviewed results with pt and her husband by phone. 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 We will review final micro in office and then decide whether we need to treat for presumed sarcoid

## 2013-05-08 ENCOUNTER — Emergency Department (HOSPITAL_COMMUNITY): Payer: No Typology Code available for payment source

## 2013-05-08 ENCOUNTER — Encounter (HOSPITAL_COMMUNITY): Payer: Self-pay | Admitting: Emergency Medicine

## 2013-05-08 ENCOUNTER — Emergency Department (HOSPITAL_COMMUNITY)
Admission: EM | Admit: 2013-05-08 | Discharge: 2013-05-09 | Disposition: A | Discharge status: Home / Self Care | Payer: No Typology Code available for payment source | Attending: Emergency Medicine | Admitting: Emergency Medicine

## 2013-05-08 DIAGNOSIS — Z862 Personal history of diseases of the blood and blood-forming organs and certain disorders involving the immune mechanism: Secondary | ICD-10-CM | POA: Insufficient documentation

## 2013-05-08 DIAGNOSIS — R6883 Chills (without fever): Secondary | ICD-10-CM | POA: Insufficient documentation

## 2013-05-08 DIAGNOSIS — K2990 Gastroduodenitis, unspecified, without bleeding: Principal | ICD-10-CM

## 2013-05-08 DIAGNOSIS — K2970 Gastritis, unspecified, without bleeding: Secondary | ICD-10-CM | POA: Insufficient documentation

## 2013-05-08 DIAGNOSIS — Z8701 Personal history of pneumonia (recurrent): Secondary | ICD-10-CM | POA: Insufficient documentation

## 2013-05-08 DIAGNOSIS — Z3202 Encounter for pregnancy test, result negative: Secondary | ICD-10-CM | POA: Insufficient documentation

## 2013-05-08 LAB — CBC WITH DIFFERENTIAL/PLATELET
Basophils Absolute: 0 10*3/uL (ref 0.0–0.1)
Basophils Relative: 0 % (ref 0–1)
Eosinophils Absolute: 0.8 10*3/uL — ABNORMAL HIGH (ref 0.0–0.7)
Eosinophils Relative: 7 % — ABNORMAL HIGH (ref 0–5)
HEMATOCRIT: 38.6 % (ref 36.0–46.0)
HEMOGLOBIN: 12.4 g/dL (ref 12.0–15.0)
Lymphocytes Relative: 14 % (ref 12–46)
Lymphs Abs: 1.6 10*3/uL (ref 0.7–4.0)
MCH: 24.8 pg — ABNORMAL LOW (ref 26.0–34.0)
MCHC: 32.1 g/dL (ref 30.0–36.0)
MCV: 77 fL — ABNORMAL LOW (ref 78.0–100.0)
MONO ABS: 1 10*3/uL (ref 0.1–1.0)
MONOS PCT: 8 % (ref 3–12)
NEUTROS ABS: 8 10*3/uL — AB (ref 1.7–7.7)
Neutrophils Relative %: 70 % (ref 43–77)
Platelets: 321 10*3/uL (ref 150–400)
RBC: 5.01 MIL/uL (ref 3.87–5.11)
RDW: 14.2 % (ref 11.5–15.5)
WBC: 11.5 10*3/uL — AB (ref 4.0–10.5)

## 2013-05-08 LAB — COMPREHENSIVE METABOLIC PANEL
ALK PHOS: 103 U/L (ref 39–117)
ALT: 17 U/L (ref 0–35)
AST: 15 U/L (ref 0–37)
Albumin: 3.8 g/dL (ref 3.5–5.2)
BUN: 13 mg/dL (ref 6–23)
CO2: 25 mEq/L (ref 19–32)
CREATININE: 0.98 mg/dL (ref 0.50–1.10)
Calcium: 9.6 mg/dL (ref 8.4–10.5)
Chloride: 99 mEq/L (ref 96–112)
GFR calc Af Amer: 88 mL/min — ABNORMAL LOW (ref >90)
GFR, EST NON AFRICAN AMERICAN: 76 mL/min — AB (ref >90)
Glucose, Bld: 135 mg/dL — ABNORMAL HIGH (ref 70–99)
POTASSIUM: 3.6 meq/L — AB (ref 3.7–5.3)
Sodium: 138 mEq/L (ref 137–147)
Total Bilirubin: 0.2 mg/dL — ABNORMAL LOW (ref 0.3–1.2)
Total Protein: 8.3 g/dL (ref 6.0–8.3)

## 2013-05-08 LAB — URINALYSIS, ROUTINE W REFLEX MICROSCOPIC
BILIRUBIN URINE: NEGATIVE
GLUCOSE, UA: NEGATIVE mg/dL
HGB URINE DIPSTICK: NEGATIVE
KETONES UR: NEGATIVE mg/dL
Leukocytes, UA: NEGATIVE
NITRITE: NEGATIVE
PH: 5.5 (ref 5.0–8.0)
Protein, ur: NEGATIVE mg/dL
Specific Gravity, Urine: 1.025 (ref 1.005–1.030)
Urobilinogen, UA: 0.2 mg/dL (ref 0.0–1.0)

## 2013-05-08 LAB — LIPASE, BLOOD: Lipase: 20 U/L (ref 11–59)

## 2013-05-08 LAB — POC URINE PREG, ED: PREG TEST UR: NEGATIVE

## 2013-05-08 MED ORDER — ONDANSETRON HCL 4 MG/2ML IJ SOLN
4.0000 mg | Freq: Once | INTRAMUSCULAR | Status: AC
  Administered 2013-05-08: 4 mg via INTRAVENOUS
  Filled 2013-05-08: qty 2

## 2013-05-08 MED ORDER — FENTANYL CITRATE 0.05 MG/ML IJ SOLN
100.0000 ug | Freq: Once | INTRAMUSCULAR | Status: AC
  Administered 2013-05-08: 100 ug via INTRAVENOUS
  Filled 2013-05-08: qty 2

## 2013-05-08 NOTE — ED Notes (Signed)
Pt states that she began to have upper abd pain last night that has progressively gotten worse; pt denies N/V/D; pt states that its just painful

## 2013-05-08 NOTE — ED Provider Notes (Signed)
CSN: [id number]     Arrival date & time 05/08/13  2042 History   First MD Initiated Contact with Patient 05/08/13 2121     Chief Complaint  Patient presents with  . Abdominal Pain     (Consider location/radiation/quality/duration/timing/severity/associated sxs/prior Treatment) HPI Ms. Zollie PeeZakou is a 33 year old female with no significant past medical history who presents tonight with approximately 24 hours of abdominal pain.  Patient states the pain began last night after dinner, has been intermittent, and waxes and wanes approximately every 30 minutes.  Patient reports discomfort is a "pain", does not radiate, has no relieving factors, and is made worse with eating.  She reports she has had heartburn in the past, however, this pain is different tonight.  She denies chest pain, shortness of breath, dizziness, nausea, vomiting, diarrhea, fever, dysuria.  She denies travel outside the country in the past 13 years. LMP 7 months ago.  She uses Nexplanon implanted device as MOC.     Past Medical History  Diagnosis Date  . Pneumococcal pneumonia   . Anemia   . Abnormal Pap smear   . Late prenatal care    Past Surgical History  Procedure Laterality Date  . No past surgeries    . Video bronchoscopy Bilateral 04/17/2013    Procedure: VIDEO BRONCHOSCOPY WITHOUT FLUORO;  Surgeon: Leslye Peerobert S Byrum, MD;  [facility];  Service: Cardiopulmonary;  Laterality: Bilateral;   Family History  Problem Relation Age of Onset  . Arthritis Neg Hx   . Alcohol abuse Neg Hx   . Birth defects Neg Hx   . Cancer Neg Hx   . COPD Neg Hx   . Depression Neg Hx   . Diabetes Neg Hx   . Drug abuse Neg Hx   . Early death Neg Hx   . Hearing loss Neg Hx   . Heart disease Neg Hx   . Hyperlipidemia Neg Hx   . Hypertension Neg Hx   . Kidney disease Neg Hx   . Learning disabilities Neg Hx   . Mental illness Neg Hx   . Mental retardation Neg Hx   . Miscarriages / Stillbirths Neg Hx   . Stroke Neg Hx   . Vision  loss Neg Hx   . Asthma Son    History  Substance Use Topics  . Smoking status: Never Smoker   . Smokeless tobacco: Never Used  . Alcohol Use: No   OB History   Grav Para Term Preterm Abortions TAB SAB Ect Mult Living   6 6 6       6      Review of Systems  Constitutional: Positive for chills. Negative for fever, diaphoresis and appetite change.  Respiratory: Negative for shortness of breath.   Cardiovascular: Negative for chest pain and leg swelling.  Gastrointestinal: Positive for abdominal pain. Negative for nausea, vomiting, diarrhea, constipation, blood in stool and abdominal distention.  Genitourinary: Negative for dysuria and vaginal bleeding.  Skin: Negative for pallor.  Neurological: Negative for dizziness and weakness.  Psychiatric/Behavioral: Negative for confusion.      Allergies  Review of patient's allergies indicates no known allergies.  Home Medications   Prior to Admission medications   Not on File   BP 145/85  Pulse 81  Temp(Src) 98.8 F (37.1 C) (Oral)  Resp 18  Ht 5\' 6"  (1.676 m)  Wt 186 lb (84.369 kg)  BMI 30.04 kg/m2  SpO2 100% Physical Exam  Constitutional: She appears well-developed and well-nourished. No distress.  HENT:  Head: Normocephalic.  Eyes: No scleral icterus.  Cardiovascular: Normal rate, regular rhythm, S1 normal, S2 normal and normal heart sounds.  Exam reveals no gallop and no friction rub.   No murmur heard. Pulses:      Posterior tibial pulses are 2+ on the right side, and 2+ on the left side.  Pulmonary/Chest: Effort normal and breath sounds normal. No accessory muscle usage. Not tachypneic. No respiratory distress.  Abdominal: Soft. Normal appearance and bowel sounds are normal. There is no splenomegaly or hepatomegaly. There is tenderness in the epigastric area. There is no rigidity, no rebound, no guarding, no tenderness at McBurney's point and negative Murphy's sign.  Skin: She is not diaphoretic.  Psychiatric: She has  a normal mood and affect.    ED Course  Procedures (including critical care time) Labs Review Labs Reviewed  CBC WITH DIFFERENTIAL - Abnormal; Notable for the following:    WBC 11.5 (*)    MCV 77.0 (*)    MCH 24.8 (*)    Neutro Abs 8.0 (*)    Eosinophils Relative 7 (*)    Eosinophils Absolute 0.8 (*)    All other components within normal limits  COMPREHENSIVE METABOLIC PANEL  LIPASE, BLOOD  URINALYSIS, ROUTINE W REFLEX MICROSCOPIC  POC URINE PREG, ED    Patient is feeling better following treatment here in the emergency, department.  She'll be referred to GI for further evaluation and also her primary care doctor.  She is told to return to the emergency department for any worsening in her condition.  All questions were answered.  Patient voices an understanding of the plan.    Carlyle Dollyhristopher W Sanjay Broadfoot, PA-C 05/09/13 [phone]

## 2013-05-09 MED ORDER — SUCRALFATE 1 G PO TABS: 1.0000 g | ORAL_TABLET | Freq: Three times a day (TID) | ORAL | Status: DC

## 2013-05-09 MED ORDER — FAMOTIDINE 20 MG PO TABS: 20.0000 mg | ORAL_TABLET | Freq: Two times a day (BID) | ORAL | Status: DC

## 2013-05-09 MED ORDER — GI COCKTAIL ~~LOC~~
30.0000 mL | Freq: Once | ORAL | Status: AC
  Administered 2013-05-09: 30 mL via ORAL
  Filled 2013-05-09: qty 30

## 2013-05-09 NOTE — ED Provider Notes (Signed)
Medical screening examination/treatment/procedure(s) were performed by non-physician practitioner and as supervising physician I was immediately available for consultation/collaboration.   EKG Interpretation None        William BlaiDagmar Haitr Luisfelipe Engelstad, MD 05/09/13 [phone]

## 2013-05-09 NOTE — Discharge Instructions (Signed)
Followup with the, GI Dr. provided.  Your testing here tonight did not show any significant abnormalities.  Return here as needed

## 2013-05-14 ENCOUNTER — Encounter: Payer: Self-pay | Admitting: Internal Medicine

## 2013-05-14 ENCOUNTER — Ambulatory Visit: Payer: No Typology Code available for payment source | Attending: Internal Medicine | Admitting: Internal Medicine

## 2013-05-14 VITALS — BP 119/87 | HR 77 | Temp 99.0°F | Resp 16 | Ht 64.0 in | Wt 182.0 lb

## 2013-05-14 DIAGNOSIS — K2970 Gastritis, unspecified, without bleeding: Secondary | ICD-10-CM | POA: Insufficient documentation

## 2013-05-14 DIAGNOSIS — R109 Unspecified abdominal pain: Secondary | ICD-10-CM | POA: Insufficient documentation

## 2013-05-14 DIAGNOSIS — K2990 Gastroduodenitis, unspecified, without bleeding: Secondary | ICD-10-CM

## 2013-05-14 DIAGNOSIS — Z09 Encounter for follow-up examination after completed treatment for conditions other than malignant neoplasm: Secondary | ICD-10-CM | POA: Insufficient documentation

## 2013-05-14 HISTORY — DX: Gastritis, unspecified, without bleeding: K29.70

## 2013-05-14 MED ORDER — ESOMEPRAZOLE MAGNESIUM 40 MG PO CPDR: 40.0000 mg | DELAYED_RELEASE_CAPSULE | Freq: Every day | ORAL | Status: DC

## 2013-05-14 NOTE — Progress Notes (Signed)
Patient ID: Corlis LeakHalimatou Steedman, female   DOB: 12/27/1980, 33 y.o.   MRN: [id number]   Corlis LeakHalimatou Leverett, is a 33 y.o. female  WUJ:[id number]  [id number]  DOB - 09/28/1980  Chief Complaint  Patient presents with  . Hospitalization Follow-up  . Abdominal Pain        Subjective:   Lillith Zollie PeeZakou is a 33 y.o. female here today for a follow up visit. Patient was seen recently in the ER for gastritis. She claims epigastric pain is improved but still persist especially after eating. Pain is more like burning, worse after food, more during the night sometimes wake her up. No nausea or vomiting, no change in bowel habit . She was prescribed famotidine and sucralfate in the ER, there is a lot of relief after taking medication but she feels it now more at night. Patient has No headache, No chest pain, - No Nausea, No new weakness tingling or numbness, No Cough - SOB.  Problem  Gastritis    ALLERGIES: No Known Allergies  PAST MEDICAL HISTORY: Past Medical History  Diagnosis Date  . Pneumococcal pneumonia   . Anemia   . Abnormal Pap smear   . Late prenatal care     MEDICATIONS AT HOME: Prior to Admission medications   Medication Sig Start Date End Date Taking? Authorizing Provider  famotidine (PEPCID) 20 MG tablet Take 1 tablet (20 mg total) by mouth 2 (two) times daily. 05/09/13  Yes Jamesetta Orleanshristopher W Lawyer, PA-C  esomeprazole (NEXIUM) 40 MG capsule Take 1 capsule (40 mg total) by mouth daily. 05/14/13   Jeanann Lewandowskylugbemiga Aneya Daddona, MD  sucralfate (CARAFATE) 1 G tablet Take 1 tablet (1 g total) by mouth 4 (four) times daily -  with meals and at bedtime. 05/09/13   Carlyle Dollyhristopher W Lawyer, PA-C     Objective:   Filed Vitals:   05/14/13 1011  BP: 119/87  Pulse: 77  Temp: 99 F (37.2 C)  TempSrc: Oral  Resp: 16  Height: 5\' 4"  (1.626 m)  Weight: 182 lb (82.555 kg)  SpO2: 97%    Exam General appearance : Awake, alert, not in any distress. Speech Clear. Not toxic looking HEENT: Atraumatic and  Normocephalic, pupils equally reactive to light and accomodation Neck: supple, no JVD. No cervical lymphadenopathy.  Chest:Good air entry bilaterally, no added sounds  CVS: S1 S2 regular, no murmurs.  Abdomen: Bowel sounds present, very mild epigastric tenderness, not distended with no gaurding, rigidity or rebound. Extremities: B/L Lower Ext shows no edema, both legs are warm to touch Neurology: Awake alert, and oriented X 3, CN II-XII intact, Non focal Skin:No Rash Wounds:N/A  Data Review No results found for this basename: HGBA1C     Assessment & Plan   1. Gastritis Prescribed - esomeprazole (NEXIUM) 40 MG capsule; Take 1 capsule (40 mg total) by mouth daily.  Dispense: 30 capsule; Refill: 3  Patient was counseled extensively about nutrition and exercise   Return in about 6 months (around 11/13/2013), or if symptoms worsen or fail to improve, for Abdominal Pain.  The patient was given clear instructions to go to ER or return to medical center if symptoms don't improve, worsen or new problems develop. The patient verbalized understanding. The patient was told to call to get lab results if they haven't heard anything in the next week.   This note has been created with Education officer, environmentalDragon speech recognition software and smart phrase technology. Any transcriptional errors are unintentional.    Jeanann Lewandowskylugbemiga Logen Fowle, MD, MHA, Maxwell CaulFACP, FAAP [place]  [facility] [place], [place] [phone]   05/14/2013, 10:31 AM

## 2013-05-14 NOTE — Patient Instructions (Signed)

## 2013-05-14 NOTE — Progress Notes (Signed)
Pt comes in as HFU- s/p Gastritis relieved with GI Cocktail. Seen  in Er 4/15 with prescribed Carafate and Pepcid Pt states the pain has resolved Pt has Implanon Denies n/v/ or fever Temp 99.0 WBC elevated in ER

## 2013-05-16 ENCOUNTER — Encounter: Payer: Self-pay | Admitting: Emergency Medicine

## 2013-05-16 ENCOUNTER — Ambulatory Visit: Payer: No Typology Code available for payment source | Admitting: Emergency Medicine

## 2013-05-16 VITALS — BP 128/84 | HR 85 | Ht 64.0 in | Wt 184.8 lb

## 2013-05-16 DIAGNOSIS — A310 Pulmonary mycobacterial infection: Secondary | ICD-10-CM | POA: Insufficient documentation

## 2013-05-16 DIAGNOSIS — J479 Bronchiectasis, uncomplicated: Secondary | ICD-10-CM

## 2013-05-16 LAB — AFB CULTURE WITH SMEAR (NOT AT ARMC): ACID FAST SMEAR: NONE SEEN

## 2013-05-16 LAB — FUNGUS CULTURE W SMEAR
Fungal Smear: NONE SEEN
Special Requests: NORMAL

## 2013-05-16 MED ORDER — RIFAMPIN 300 MG PO CAPS: 600.0000 mg | ORAL_CAPSULE | Freq: Every day | ORAL | Status: DC

## 2013-05-16 MED ORDER — ETHAMBUTOL HCL 400 MG PO TABS: 1200.0000 mg | ORAL_TABLET | Freq: Every day | ORAL | Status: DC

## 2013-05-16 MED ORDER — CLARITHROMYCIN 500 MG PO TABS: 500.0000 mg | ORAL_TABLET | Freq: Two times a day (BID) | ORAL | Status: DC

## 2013-05-16 MED ORDER — FLUTICASONE PROPIONATE 50 MCG/ACT NA SUSP: 2.0000 | Freq: Two times a day (BID) | NASAL | Status: DC

## 2013-05-16 NOTE — Assessment & Plan Note (Signed)
New dx MAIC with non-necrotizing granulomas.  - start MAIC 3 drug therapy - start an allergy regimen - rov 1

## 2013-05-16 NOTE — Patient Instructions (Signed)
We will start antibiotics for your mycobacterial infection. You will take clarithromycin, rifampin and ethambutol.  Start fluticasone nasal spray, 2 sprays each nostril twice a day Follow with Dr Delton CoombesByrum in 1 month

## 2013-05-16 NOTE — Assessment & Plan Note (Signed)
New dx from sputum 03/25/13, sensitivities not yet available.  - start clarithro + ethambutol + rifampin (higher dose regimen  Given her extensive bronchiectasis).

## 2013-05-16 NOTE — Progress Notes (Signed)
Subjective:    Patient ID: Sarah Arnold, female    DOB: [birth year], 33 y.o.   MRN: [id number]  HPI 33 yo never smoker, referred for evaluation for chronic rhinitis and cough. This became more problematic about 4-5 months ago when she was pregnant with her 6th child. She delivered in 9/15 but has continued to experience progressive yellow mucous production in addition to her chronic rhinitis. She was seen in the ED 3 weeks ago and then referred here.   Autoimmune screen was performed 02/11/13 by Dr Doreene Burke > negative ESR, ANA, ANCA, dsDNA. Her ACE level was elevated at 97. HIV 07/2011 > negative. No known TB exposure.   CT chest 02/13/13 > severe bronchiectasis, peribronchovascular GGI and micronodular disease. No honeycombing.   ROV 04/10/13 -- follows for cough and severe bronchiectasis and micronodular disease. She is still coughing, produces sputum. No SOB. She does have nasal congestion.   ROV 05/16/13 -- follows for follows for cough and severe bronchiectasis and micronodular disease. Her FOB showed non-necrotizing granulomas, cx's negative so far but her prior AFB cx have now grown out [facility].   Results: sputum AFB negative, fungal negative Quant Gold > negative IgE > 933 Aspergillus fungal panel >> negative Endobronchial biopsies >> non caseating granulomas, AFB stain negative Sputum AFB 03/25/13 >> POSITIVE FOR MAIC BAL AFB 04/17/13 >> Negative so far >>     Denies Fevers, Chills, Malaise. Denies sweats.  Review of Systems  Constitutional: Negative for fever and unexpected weight change.  HENT: Positive for congestion and postnasal drip. Negative for dental problem, ear pain, nosebleeds, rhinorrhea, sinus pressure, sneezing, sore throat and trouble swallowing.   Eyes: Negative for redness and itching.  Respiratory: Positive for cough. Negative for chest tightness, shortness of breath and wheezing.   Cardiovascular: Negative for palpitations and leg swelling.  Gastrointestinal:  Negative for nausea and vomiting.  Genitourinary: Negative for dysuria.  Musculoskeletal: Negative for joint swelling.  Skin: Negative for rash.  Neurological: Negative for headaches.  Psychiatric/Behavioral: Negative for dysphoric mood. The patient is not nervous/anxious.        Objective:   Physical Exam Filed Vitals:   05/16/13 0950  BP: 128/84  Pulse: 85  Height: 5' 4"  (1.626 m)  Weight: 184 lb 12.8 oz (83.825 kg)  SpO2: 99%   Gen: Pleasant, well-nourished, in no distress,  normal affect  ENT: No lesions,  mouth clear,  oropharynx clear, no postnasal drip  Neck: No JVD, no TMG, no carotid bruits  Lungs: No use of accessory muscles, no dullness to percussion, clear without rales or rhonchi  Cardiovascular: RRR, heart sounds normal, no murmur or gallops, no peripheral edema  Musculoskeletal: No deformities, no cyanosis or clubbing  Neuro: alert, non focal  Skin: Warm, no lesions or rashes   02/13/13 CT chest  COMPARISON: No prior chest CTs. Chest x-ray 12/25/2012.  FINDINGS:  Study is limited by considerable patient respiratory motion.  Mediastinum: Heart size is normal. There is no significant  pericardial fluid, thickening or pericardial calcification. No  pathologically enlarged mediastinal or hilar lymph nodes, although  there is some prominent lymphoid tissue in the hilar regions and  subcarinal region which cannot be discretely measured.  Lungs/Pleura: There is extensive cylindrical and varicose  bronchiectasis throughout all aspects of the lungs bilaterally, most  severe in the lower lobes. Extensive thickening of the  peribronchovascular interstitium is noted diffusely. In the areas of  most severe involvement there is also extensive peribronchovascular  micro and macronodularity, predominantly  ground-glass attenuation in  appearance. There are some more focal areas of architectural  distortion in the posterior aspect of the right upper lobe and   periphery of the right lower lobe, favored to represent chronic post  infectious or inflammatory scarring. In the remainder the lungs  there are no significant areas of subpleural reticulation,  parenchymal banding or frank honeycombing to strongly suggest an  interstitial lung disease. Inspiratory and expiratory imaging is  unremarkable.  Upper Abdomen: Unremarkable.  Musculoskeletal: There are no aggressive appearing lytic or blastic  lesions noted in the visualized portions of the skeleton.  IMPRESSION:  1. The appearance of the lungs is not compatible with an underlying  interstitial lung disease. Rather, there is extensive cylindrical  and varicose bronchiectasis with presumed chronic infectious and  inflammatory changes throughout the lungs bilaterally (described  above). This can be seen in the setting of chronic indolent atypical  infectious process such as MAI (mycobacterium avium intracellulare),  or other atypical mycobacteria.       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  - start clarithro + ethambutol + rifampin (higher dose regimen  Given her extensive bronchiectasis).   Bronchiectasis without acute exacerbation New dx MAIC with non-necrotizing granulomas.  - start [place] 3 drug therapy - start an allergy regimen - rov 1

## 2013-05-30 LAB — AFB CULTURE WITH SMEAR (NOT AT ARMC)
Acid Fast Smear: NONE SEEN
Special Requests: NORMAL

## 2013-06-03 ENCOUNTER — Telehealth: Payer: Self-pay | Admitting: Emergency Medicine

## 2013-06-03 NOTE — Telephone Encounter (Signed)
Spoke with the pt  She is asking about rifampin and whether or not to continue since she is almost due for a refill  I advised to get the med refilled and continue to take until next ov  I made her an appt for 06/14/13 at 11:45 am Nothing further needed per pt

## 2013-06-14 ENCOUNTER — Ambulatory Visit: Payer: No Typology Code available for payment source | Admitting: Emergency Medicine

## 2013-06-18 ENCOUNTER — Telehealth: Payer: Self-pay | Admitting: Emergency Medicine

## 2013-06-18 NOTE — Telephone Encounter (Signed)
Error

## 2013-06-19 ENCOUNTER — Ambulatory Visit: Payer: No Typology Code available for payment source | Admitting: Emergency Medicine

## 2013-06-19 ENCOUNTER — Encounter: Payer: Self-pay | Admitting: Emergency Medicine

## 2013-06-19 VITALS — BP 136/80 | HR 88 | Ht 64.0 in | Wt 186.0 lb

## 2013-06-19 DIAGNOSIS — A310 Pulmonary mycobacterial infection: Secondary | ICD-10-CM

## 2013-06-19 MED ORDER — ETHAMBUTOL HCL 400 MG PO TABS: 1200.0000 mg | ORAL_TABLET | Freq: Every day | ORAL | Status: DC

## 2013-06-19 MED ORDER — RIFAMPIN 300 MG PO CAPS: 600.0000 mg | ORAL_CAPSULE | Freq: Every day | ORAL | Status: DC

## 2013-06-19 MED ORDER — CLARITHROMYCIN 500 MG PO TABS: 500.0000 mg | ORAL_TABLET | Freq: Two times a day (BID) | ORAL | Status: DC

## 2013-06-19 NOTE — Assessment & Plan Note (Signed)
Please continue your current clarithromycin, ethambutol, rifampin as you are taking them We will write new prescriptions for you to see if you can obtain from alternative pharmacy for lower price. Generic formulations should be fine.  Follow with Dr Azie Mcconahy in 3 months or sooner if you have any problems.  

## 2013-06-19 NOTE — Patient Instructions (Signed)
Please continue your current clarithromycin, ethambutol, rifampin as you are taking them We will write new prescriptions for you to see if you can obtain from alternative pharmacy for lower price. Generic formulations should be fine.  Follow with Dr Delton Coombes in 3 months or sooner if you have any problems.

## 2013-06-19 NOTE — Progress Notes (Signed)
Subjective:    Patient ID: Sarah Arnold, female    DOB: 09-16-1980, 33 y.o.   MRN: [id number]  HPI 33 yo never smoker, referred for evaluation for chronic rhinitis and cough. This became more problematic about 4-5 months ago when she was pregnant with her 6th child. She delivered in 9/15 but has continued to experience progressive yellow mucous production in addition to her chronic rhinitis. She was seen in the ED 3 weeks ago and then referred here.   Autoimmune screen was performed 02/11/13 by Dr Doreene Burke > negative ESR, ANA, ANCA, dsDNA. Her ACE level was elevated at 97. HIV 07/2011 > negative. No known TB exposure.   CT chest 02/13/13 > severe bronchiectasis, peribronchovascular GGI and micronodular disease. No honeycombing.   ROV 04/10/13 -- follows for cough and severe bronchiectasis and micronodular disease. She is still coughing, produces sputum. No SOB. She does have nasal congestion.   ROV 05/16/13 -- follows for follows for cough and severe bronchiectasis and micronodular disease. Her FOB showed non-necrotizing granulomas, cx's negative so far but her prior AFB cx have now grown out [facility].   Results: sputum AFB negative, fungal negative Quant Gold > negative IgE > 933 Aspergillus fungal panel >> negative Endobronchial biopsies >> non caseating granulomas, AFB stain negative Sputum AFB 03/25/13 >> POSITIVE FOR MAIC BAL AFB 04/17/13 >> Negative so far >>   ROV 06/19/13 -- follow up for bronchiectasis and MAIC. We started clarithro + ethambutol + rifampin. She has tolerated well, but the cost is high and is a concern. She is looking at different pharmacies to see if she can get cheaper. She is cough is coughing, bringing up light yellow. Feels the same.    Denies Fevers, Chills, Malaise. Denies sweats.  Review of Systems  Constitutional: Negative for fever and unexpected weight change.  HENT: Positive for congestion and postnasal drip. Negative for dental problem, ear pain, nosebleeds,  rhinorrhea, sinus pressure, sneezing, sore throat and trouble swallowing.   Eyes: Negative for redness and itching.  Respiratory: Positive for cough. Negative for chest tightness, shortness of breath and wheezing.   Cardiovascular: Negative for palpitations and leg swelling.  Gastrointestinal: Negative for nausea and vomiting.  Genitourinary: Negative for dysuria.  Musculoskeletal: Negative for joint swelling.  Skin: Negative for rash.  Neurological: Negative for headaches.  Psychiatric/Behavioral: Negative for dysphoric mood. The patient is not nervous/anxious.        Objective:   Physical Exam Filed Vitals:   06/19/13 0948  BP: 136/80  Pulse: 88  Height: 5' 4"  (1.626 m)  Weight: 186 lb (84.369 kg)  SpO2: 97%   Gen: Pleasant, well-nourished, in no distress,  normal affect  ENT: No lesions,  mouth clear,  oropharynx clear, no postnasal drip  Neck: No JVD, no TMG, no carotid bruits  Lungs: No use of accessory muscles, no dullness to percussion, clear without rales or rhonchi  Cardiovascular: RRR, heart sounds normal, no murmur or gallops, no peripheral edema  Musculoskeletal: No deformities, no cyanosis or clubbing  Neuro: alert, non focal  Skin: Warm, no lesions or rashes   02/13/13 CT chest  COMPARISON: No prior chest CTs. Chest x-ray 12/25/2012.  FINDINGS:  Study is limited by considerable patient respiratory motion.  Mediastinum: Heart size is normal. There is no significant  pericardial fluid, thickening or pericardial calcification. No  pathologically enlarged mediastinal or hilar lymph nodes, although  there is some prominent lymphoid tissue in the hilar regions and  subcarinal region which cannot be discretely  measured.  Lungs/Pleura: There is extensive cylindrical and varicose  bronchiectasis throughout all aspects of the lungs bilaterally, most  severe in the lower lobes. Extensive thickening of the  peribronchovascular interstitium is noted diffusely. In  the areas of  most severe involvement there is also extensive peribronchovascular  micro and macronodularity, predominantly ground-glass attenuation in  appearance. There are some more focal areas of architectural  distortion in the posterior aspect of the right upper lobe and  periphery of the right lower lobe, favored to represent chronic post  infectious or inflammatory scarring. In the remainder the lungs  there are no significant areas of subpleural reticulation,  parenchymal banding or frank honeycombing to strongly suggest an  interstitial lung disease. Inspiratory and expiratory imaging is  unremarkable.  Upper Abdomen: Unremarkable.  Musculoskeletal: There are no aggressive appearing lytic or blastic  lesions noted in the visualized portions of the skeleton.  IMPRESSION:  1. The appearance of the lungs is not compatible with an underlying  interstitial lung disease. Rather, there is extensive cylindrical  and varicose bronchiectasis with presumed chronic infectious and  inflammatory changes throughout the lungs bilaterally (described  above). This can be seen in the setting of chronic indolent atypical  infectious process such as MAI (mycobacterium avium intracellulare),  or other atypical mycobacteria.       Assessment & Plan:  MAIC (mycobacterium avium-intracellulare complex) Please continue your current clarithromycin, ethambutol, rifampin as you are taking them We will write new prescriptions for you to see if you can obtain from alternative pharmacy for lower price. Generic formulations should be fine.  Follow with Dr Lamonte Sakai in 3 months or sooner if you have any problems

## 2013-10-15 ENCOUNTER — Encounter: Payer: Self-pay | Admitting: Emergency Medicine

## 2013-10-15 ENCOUNTER — Ambulatory Visit (INDEPENDENT_AMBULATORY_CARE_PROVIDER_SITE_OTHER): Payer: Self-pay | Admitting: Emergency Medicine

## 2013-10-15 VITALS — BP 124/86 | HR 81 | Temp 98.5°F | Ht 64.0 in | Wt 187.2 lb

## 2013-10-15 DIAGNOSIS — A310 Pulmonary mycobacterial infection: Secondary | ICD-10-CM

## 2013-10-15 DIAGNOSIS — A318 Other mycobacterial infections: Secondary | ICD-10-CM

## 2013-10-15 NOTE — Assessment & Plan Note (Signed)
We will continue 3 drug abx for total of 9 months and then check a repeat CT scan to help decide whether we can consider stopping.

## 2013-10-15 NOTE — Patient Instructions (Signed)
Please continue your clarithromycin + ethambutol + rifampin as you have been taking them  In February 2016 we will repeat your CT scan of your chest and use this to help decide if we can stop your antibiotics.  Follow with Dr Delton Coombes in February after your Ct scan to review, or sooner if you have any problems.

## 2013-10-15 NOTE — Progress Notes (Signed)
Subjective:    Patient ID: Sarah Arnold, female    DOB: 02/04/1980, 33 y.o.   MRN: [id number]  HPI 33 yo never smoker, referred for evaluation for chronic rhinitis and cough. This became more problematic about 4-5 months ago when she was pregnant with her 6th child. She delivered in 9/15 but has continued to experience progressive yellow mucous production in addition to her chronic rhinitis. She was seen in the ED 3 weeks ago and then referred here.   Autoimmune screen was performed 02/11/13 by Dr Doreene Burke > negative ESR, ANA, ANCA, dsDNA. Her ACE level was elevated at 97. HIV 07/2011 > negative. No known TB exposure.   CT chest 02/13/13 > severe bronchiectasis, peribronchovascular GGI and micronodular disease. No honeycombing.   ROV 04/10/13 -- follows for cough and severe bronchiectasis and micronodular disease. She is still coughing, produces sputum. No SOB. She does have nasal congestion.   ROV 05/16/13 -- follows for follows for cough and severe bronchiectasis and micronodular disease. Her FOB showed non-necrotizing granulomas, cx's negative so far but her prior AFB cx have now grown out [facility].   Results: sputum AFB negative, fungal negative Quant Gold > negative IgE > 933 Aspergillus fungal panel >> negative Endobronchial biopsies >> non caseating granulomas, AFB stain negative Sputum AFB 03/25/13 >> POSITIVE FOR MAIC BAL AFB 04/17/13 >> Negative so far >>   ROV 06/19/13 -- follow up for bronchiectasis and MAIC. We started clarithro + ethambutol + rifampin. She has tolerated well, but the cost is high and is a concern. She is looking at different pharmacies to see if she can get cheaper. She is coughing, bringing up light yellow. Feels the same.  ROV 10/15/13 -- follow up for [facility] and associated bronchiectasis. Tolerating 3 drug regimen. She has been on for 4 months. No side effects noted. Cough is better.     Denies Fevers, Chills, Malaise. Denies sweats.  Review of Systems   Constitutional: Negative for fever and unexpected weight change.  HENT: Positive for congestion and postnasal drip. Negative for dental problem, ear pain, nosebleeds, rhinorrhea, sinus pressure, sneezing, sore throat and trouble swallowing.   Eyes: Negative for redness and itching.  Respiratory: Positive for cough. Negative for chest tightness, shortness of breath and wheezing.   Cardiovascular: Negative for palpitations and leg swelling.  Gastrointestinal: Negative for nausea and vomiting.  Genitourinary: Negative for dysuria.  Musculoskeletal: Negative for joint swelling.  Skin: Negative for rash.  Neurological: Negative for headaches.  Psychiatric/Behavioral: Negative for dysphoric mood. The patient is not nervous/anxious.        Objective:   Physical Exam Filed Vitals:   10/15/13 1102  BP: 124/86  Pulse: 81  Temp: 98.5 F (36.9 C)  TempSrc: Oral  Height: 5' 4"  (1.626 m)  Weight: 187 lb 3.2 oz (84.913 kg)  SpO2: 99%   Gen: Pleasant, well-nourished, in no distress,  normal affect  ENT: No lesions,  mouth clear,  oropharynx clear, no postnasal drip  Neck: No JVD, no TMG, no carotid bruits  Lungs: No use of accessory muscles, no dullness to percussion, clear without rales or rhonchi  Cardiovascular: RRR, heart sounds normal, no murmur or gallops, no peripheral edema  Musculoskeletal: No deformities, no cyanosis or clubbing  Neuro: alert, non focal  Skin: Warm, no lesions or rashes   02/13/13 CT chest  COMPARISON: No prior chest CTs. Chest x-ray 12/25/2012.  FINDINGS:  Study is limited by considerable patient respiratory motion.  Mediastinum: Heart size is normal. There  is no significant  pericardial fluid, thickening or pericardial calcification. No  pathologically enlarged mediastinal or hilar lymph nodes, although  there is some prominent lymphoid tissue in the hilar regions and  subcarinal region which cannot be discretely measured.  Lungs/Pleura: There is  extensive cylindrical and varicose  bronchiectasis throughout all aspects of the lungs bilaterally, most  severe in the lower lobes. Extensive thickening of the  peribronchovascular interstitium is noted diffusely. In the areas of  most severe involvement there is also extensive peribronchovascular  micro and macronodularity, predominantly ground-glass attenuation in  appearance. There are some more focal areas of architectural  distortion in the posterior aspect of the right upper lobe and  periphery of the right lower lobe, favored to represent chronic post  infectious or inflammatory scarring. In the remainder the lungs  there are no significant areas of subpleural reticulation,  parenchymal banding or frank honeycombing to strongly suggest an  interstitial lung disease. Inspiratory and expiratory imaging is  unremarkable.  Upper Abdomen: Unremarkable.  Musculoskeletal: There are no aggressive appearing lytic or blastic  lesions noted in the visualized portions of the skeleton.  IMPRESSION:  1. The appearance of the lungs is not compatible with an underlying  interstitial lung disease. Rather, there is extensive cylindrical  and varicose bronchiectasis with presumed chronic infectious and  inflammatory changes throughout the lungs bilaterally (described  above). This can be seen in the setting of chronic indolent atypical  infectious process such as MAI (mycobacterium avium intracellulare),  or other atypical mycobacteria.       Assessment & Plan:  MAIC (mycobacterium avium-intracellulare complex) We will continue 3 drug abx for total of 9 months and then check a repeat CT scan to help decide whether we can consider stopping.

## 2013-11-25 ENCOUNTER — Encounter: Payer: Self-pay | Admitting: Emergency Medicine

## 2013-12-18 ENCOUNTER — Ambulatory Visit: Payer: Self-pay | Attending: Internal Medicine

## 2014-03-17 ENCOUNTER — Telehealth: Payer: Self-pay | Admitting: Emergency Medicine

## 2014-03-17 ENCOUNTER — Ambulatory Visit (INDEPENDENT_AMBULATORY_CARE_PROVIDER_SITE_OTHER)
Admission: RE | Admit: 2014-03-17 | Discharge: 2014-03-17 | Disposition: A | Discharge status: Home / Self Care | Payer: No Typology Code available for payment source | Source: Ambulatory Visit | Attending: Emergency Medicine | Admitting: Emergency Medicine

## 2014-03-17 ENCOUNTER — Other Ambulatory Visit: Payer: Self-pay

## 2014-03-17 DIAGNOSIS — A310 Pulmonary mycobacterial infection: Secondary | ICD-10-CM

## 2014-03-17 NOTE — Telephone Encounter (Signed)
Attempted to call. No answer, no option to leave a message. Will try back. 

## 2014-03-18 NOTE — Telephone Encounter (Signed)
Attempted to call pt. No answer, no option to leave a message. Will try back. 

## 2014-03-19 NOTE — Telephone Encounter (Signed)
Advised pt that the appointment on 05/20/14 is the only available time that RB is going to have in the AM. She agreed.

## 2014-05-08 ENCOUNTER — Ambulatory Visit: Payer: No Typology Code available for payment source | Admitting: Internal Medicine

## 2014-05-15 ENCOUNTER — Encounter: Payer: Self-pay | Admitting: Internal Medicine

## 2014-05-15 ENCOUNTER — Ambulatory Visit: Payer: No Typology Code available for payment source | Attending: Internal Medicine | Admitting: Internal Medicine

## 2014-05-15 VITALS — BP 107/73 | HR 83 | Temp 98.7°F | Resp 16 | Ht 64.0 in | Wt 187.0 lb

## 2014-05-15 DIAGNOSIS — R229 Localized swelling, mass and lump, unspecified: Secondary | ICD-10-CM

## 2014-05-15 DIAGNOSIS — R059 Cough, unspecified: Secondary | ICD-10-CM

## 2014-05-15 DIAGNOSIS — D869 Sarcoidosis, unspecified: Secondary | ICD-10-CM | POA: Insufficient documentation

## 2014-05-15 DIAGNOSIS — R05 Cough: Secondary | ICD-10-CM

## 2014-05-15 HISTORY — DX: Localized swelling, mass and lump, unspecified: R22.9

## 2014-05-15 LAB — CBC WITH DIFFERENTIAL/PLATELET
Basophils Absolute: 0 K/uL (ref 0.0–0.1)
Basophils Relative: 0 % (ref 0–1)
Eosinophils Absolute: 0.8 K/uL — ABNORMAL HIGH (ref 0.0–0.7)
Eosinophils Relative: 11 % — ABNORMAL HIGH (ref 0–5)
HCT: 36.6 % (ref 36.0–46.0)
Hemoglobin: 11.8 g/dL — ABNORMAL LOW (ref 12.0–15.0)
Lymphocytes Relative: 32 % (ref 12–46)
Lymphs Abs: 2.2 K/uL (ref 0.7–4.0)
MCH: 25.1 pg — ABNORMAL LOW (ref 26.0–34.0)
MCHC: 32.2 g/dL (ref 30.0–36.0)
MCV: 77.7 fL — ABNORMAL LOW (ref 78.0–100.0)
MPV: 10.4 fL (ref 8.6–12.4)
Monocytes Absolute: 0.6 K/uL (ref 0.1–1.0)
Monocytes Relative: 9 % (ref 3–12)
Neutro Abs: 3.4 K/uL (ref 1.7–7.7)
Neutrophils Relative %: 48 % (ref 43–77)
Platelets: 312 K/uL (ref 150–400)
RBC: 4.71 MIL/uL (ref 3.87–5.11)
RDW: 14.5 % (ref 11.5–15.5)
WBC: 7 K/uL (ref 4.0–10.5)

## 2014-05-15 LAB — COMPLETE METABOLIC PANEL WITH GFR
ALK PHOS: 75 U/L (ref 39–117)
ALT: 10 U/L (ref 0–35)
AST: 12 U/L (ref 0–37)
Albumin: 3.9 g/dL (ref 3.5–5.2)
BILIRUBIN TOTAL: 0.2 mg/dL (ref 0.2–1.2)
BUN: 19 mg/dL (ref 6–23)
CO2: 28 mEq/L (ref 19–32)
CREATININE: 1.03 mg/dL (ref 0.50–1.10)
Calcium: 9.4 mg/dL (ref 8.4–10.5)
Chloride: 105 mEq/L (ref 96–112)
GFR, Est African American: 83 mL/min
GFR, Est Non African American: 72 mL/min
Glucose, Bld: 89 mg/dL (ref 70–99)
Potassium: 4.9 mEq/L (ref 3.5–5.3)
Sodium: 141 mEq/L (ref 135–145)
Total Protein: 6.9 g/dL (ref 6.0–8.3)

## 2014-05-15 LAB — POCT GLYCOSYLATED HEMOGLOBIN (HGB A1C): Hemoglobin A1C: 5.9

## 2014-05-15 LAB — LIPID PANEL
Cholesterol: 150 mg/dL (ref 0–200)
HDL: 51 mg/dL (ref >=46)
LDL Cholesterol: 82 mg/dL (ref 0–99)
Total CHOL/HDL Ratio: 2.9 Ratio
Triglycerides: 83 mg/dL (ref <150)
VLDL: 17 mg/dL (ref 0–40)

## 2014-05-15 LAB — TSH: TSH: 3.463 u[IU]/mL (ref 0.350–4.500)

## 2014-05-15 NOTE — Progress Notes (Signed)
Patient has painful bumps on her left index finger that have been there for 2-3 years.  She would like them removed

## 2014-05-15 NOTE — Patient Instructions (Signed)
Sarcoidosis, Schaumann's Disease, Sarcoid of [place] Sarcoidosis appears briefly and heals naturally in 60 to 70 percent of cases, often without the patient knowing or doing anything about it. 20 to 30 percent of patients with sarcoidosis are left with some permanent lung damage. In 10 to 15 percent of the patients, sarcoidosis can become chronic (long lasting). When either the granulomas or fibrosis seriously affect the function of a vital organ (lungs, heart, nervous system, liver, or kidneys), sarcoidosis can be fatal. This occurs 5 to 10 percent of the time. No one can predict how sarcoidosis will progress in an individual patient. The symptoms the patient experiences, the caregiver's findings, and the patient's race can give some clues. Sarcoidosis was once considered a rare disease. We now know that it is a common chronic illness that appears all over the world. It is the most common of the fibrotic (scarring) lung disorders. Anyone can get sarcoidosis. It occurs in all races and in both sexes. The risk is greater if you are a young black adult, especially a black woman, or are of Scandinavian, German, Irish, or Puerto Rican origin. In sarcoidosis, small lumps (also called nodules or granulomas) develop in multiple organs of the body. These granulomas are small collections of inflamed cells. They commonly appear in the lungs. This is the most common organ affected. They also occur in the lymph nodes (your glands), skin, liver, and eyes. The granulomas vary in the amount of disease they produce from very little with no problems (symptoms) to causing severe illness. The cause of sarcoidosis is not known. It may be due to an abnormal immune reaction in the body. Most people will recover. A few people will develop long lasting conditions that may get worse. Women are affected more often than men. The majority of those affected are under forty years of age. Because we do not know the cause, we do not have ways to  prevent it. SYMPTOMS   Fever.  Loss of appetite.  Night sweats.  Joint pain.  Aching muscles Symptoms vary because the disease affects different parts of the body in different people. Most people who see their caregiver with sarcoidosis have lung problems. The first signs are usually a dry cough and shortness of breath. There may also be wheezing, chest pain, or a cough that brings up bloody mucus. In severe cases, lung function may become so poor that the person cannot perform even the simple routine tasks of daily life. Other symptoms of sarcoidosis are less common than lung symptoms. They can include:  Skin symptoms. Sarcoidosis can appear as a collection of tender, red bumps called erythema nodosum. These bumps usually occur on the face, shins, and arms. They can also occur as a scaly, purplish discoloration on the nose, cheeks, and ears. This is called lupus pernio. Less often, sarcoidosis causes cysts, pimples, or disfiguring over growths of skin. In many cases, the disfiguring over growths develop in areas of scars or tattoos.  Eye symptoms. These include redness, eye pain, and sensitivity to light.  Heart symptoms. These include irregular heartbeat and heart failure.  Other symptoms. A person may have paralyzed facial muscles, seizures, psychiatric symptoms, swollen salivary glands, or bone pain. DIAGNOSIS  Even when there are no symptoms, your caregiver can sometimes pick up signs of sarcoidosis during a routine examination, usually through a chest x-ray or when checking other complaints. The patient's age and race or ethnic group can raise an additional red flag that a sign or symptom could   be related to sarcoidosis.   Enlargement of the salivary or tear glands and cysts in bone tissue may also be caused by sarcoidosis.  You may have had a biopsy done that shows signs of sarcoidosis. A biopsy is a small tissue sample that is removed for laboratory testing. This tissue sample can  be taken from your lung, skin, lip, or another inflamed or abnormal area of the body.  You may have had an abnormal chest X-ray. Although you appear healthy, a chest X-ray ordered for other reasons may turn up abnormalities that suggest sarcoidosis.  Other tests may be needed. These tests may be done to rule out other illnesses or to determine the amount of organ damage caused by sarcoidosis. Some of the most common tests are:  Blood levels of calcium or angiotensin-converting enzyme may be high in people with sarcoidosis.  Blood tests to evaluate how well your liver is functioning.  Lung function tests to measure how well you are breathing.  A complete eye examination. TREATMENT  If sarcoidosis does not cause any problems, treatment may not be necessary. Your caregiver may decide to simply monitor your condition. As part of this monitoring process, you may have frequent office visits, follow-up chest X-rays, and tests of your lung function.If you have signs of moderate or severe lung disease, your doctor may recommend:  A corticosteroid drug, such as prednisone (sold under several brand names).  Corticosteroids also are used to treat sarcoidosis of the eyes, joints, skin, nerves, or heart.  Corticosteroid eye drops may be used for the eyes.  Over-the-counter medications like nonsteroidal anti-inflammatory drugs (NSAID) often are used to treat joint pain first before corticosteroids, which tend to have more side effects.  If corticosteroids are not effective or cause serious side effects, other drugs that alter or suppress the immune system may be used.  In rare cases, when sarcoidosis causes life-threatening lung disease, a lung transplant may be necessary. However, there is some risk that the new lungs also will be attacked by sarcoidosis. SEEK IMMEDIATE MEDICAL CARE IF:   You suffer from shortness of breath or a lingering cough.  You develop new problems that may be related to the  disease. Remember this disease can affect almost all organs of the body and cause many different problems. Document Released: 11/11/2003 Document Revised: 04/04/2011 Document Reviewed: 05/08/2013 Exit[employer] Patient Information 2015 [employer]. This information is not intended to replace advice given to you by your health care provider. Make sure you discuss any questions you have with your health care provider.  

## 2014-05-15 NOTE — Progress Notes (Signed)
Patient ID: Sarah Arnold, female   DOB: 12/27/1980, 34 y.o.   MRN: [id number]   Sarah Arnold, is a 34 y.o. female  [id number]  [id number]  DOB - 02/27/1980  Chief Complaint  Patient presents with  . painful bumps on left index finger        Subjective:   Sarah Arnold is a 34 y.o. female here today for a follow up visit. Patient has no significant medical history but noticed multiple bumps that needs workup. Patient has painful bumps on her left index finger that have been there for 2-3 years. She would like them removed. She denies any weight loss, no headache no visual impairment. Patient does not smoke cigarette, she does not drink alcohol. She used to work as a Engineer, watercleaner at [employer], she was exposed to chemicals for cleaning but for only one year. She lives at home with her husband, [place] both immig[place] to [place] from [place]. Patient has No headache, No chest pain, No abdominal pain - No Nausea, No new weakness tingling or numbness, No Cough - SOB.  CT chest 02/13/13 > severe bronchiectasis, peribronchovascular GGI and micronodular disease. No honeycombing.   She follows up with pulmonologist.   Results: sputum AFB negative, fungal negative Quant Gold > negative IgE > 933 Aspergillus fungal panel >> negative Endobronchial biopsies >> non caseating granulomas, AFB stain negative Sputum AFB 03/25/13 >> POSITIVE FOR MAIC BAL AFB 04/17/13 >> Negative so far >>    Problem  Multiple Skin Nodules  Sarcoidosis    ALLERGIES: No Known Allergies  PAST MEDICAL HISTORY: Past Medical History  Diagnosis Date  . Pneumococcal pneumonia   . Anemia   . Abnormal Pap smear   . Late prenatal care     MEDICATIONS AT HOME: Prior to Admission medications   Medication Sig Start Date End Date Taking? Authorizing Provider  esomeprazole (NEXIUM) 40 MG capsule Take 1 capsule (40 mg total) by mouth daily. 05/14/13  Yes Quentin Angstlugbemiga E Sheyenne Konz, MD  ethambutol  (MYAMBUTOL) 400 MG tablet Take 3 tablets (1,200 mg total) by mouth daily. 06/19/13  Yes Leslye Peerobert S Byrum, MD  famotidine (PEPCID) 20 MG tablet Take 1 tablet (20 mg total) by mouth 2 (two) times daily. 05/09/13  Yes Christopher Lawyer, PA-C  fluticasone (FLONASE) 50 MCG/ACT nasal spray Place 2 sprays into both nostrils 2 (two) times daily. 05/16/13  Yes Leslye Peerobert S Byrum, MD  rifampin (RIFADIN) 300 MG capsule Take 2 capsules (600 mg total) by mouth daily. 06/19/13  Yes Leslye Peerobert S Byrum, MD  sucralfate (CARAFATE) 1 G tablet Take 1 tablet (1 g total) by mouth 4 (four) times daily -  with meals and at bedtime. 05/09/13  Yes Charlestine Nighthristopher Lawyer, PA-C     Objective:   Filed Vitals:   05/15/14 0959  BP: 107/73  Pulse: 83  Temp: 98.7 F (37.1 C)  Resp: 16  Height: 5\' 4"  (1.626 m)  Weight: 187 lb (84.823 kg)  SpO2: 99%    Exam General appearance : Awake, alert, not in any distress. Speech Clear. Not toxic looking HEENT: Atraumatic and Normocephalic, pupils equally reactive to light and accomodation Neck: supple, no JVD. No cervical lymphadenopathy.  Chest:Good air entry bilaterally, no added sounds  CVS: S1 S2 regular, no murmurs.  Abdomen: Bowel sounds present, Non tender and not distended with no gaurding, rigidity or rebound. Extremities: B/L Lower Ext shows no edema, both legs are warm to touch Neurology: Awake alert, and oriented X 3, CN  II-XII intact, Non focal Skin:No Rash  Data Review No results found for: HGBA1C   Assessment & Plan   1. 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 Sarcoidosis  - Ambulatory referral to Rheumatology - TSH - Lipid panel - Follow up with Pulmonologist next week as scheduled  3. Cough  - Recent CT Chest with Contrast reviewed by me, result noted, explained to patient - CBC with Differential/Platelet - COMPLETE METABOLIC PANEL WITH GFR - POCT glycosylated  hemoglobin (Hb A1C)  Patient have been counseled extensively about nutrition and exercise Return in about 6 months (around 11/14/2014) for Follow up Pain and comorbidities, Annual Physical.  The patient was given clear instructions to go to ER or return to medical center if symptoms don't improve, worsen or new problems develop. The patient verbalized understanding. The patient was told to call to get lab results if they haven't heard anything in the next week.   This note has been created with Education officer, environmental. Any transcriptional errors are unintentional.    Jeanann Lewandowsky, MD, MHA, CPE, FACP, FAAP [facility] [place], [place] [phone]   05/15/2014, 10:33 AM

## 2014-05-19 ENCOUNTER — Telehealth: Payer: Self-pay

## 2014-05-19 NOTE — Telephone Encounter (Signed)
Patient is aware of her lab results 

## 2014-05-19 NOTE — Telephone Encounter (Signed)
-----   Message from Quentin Angstlugbemiga E Jegede, MD sent at 05/16/2014  3:46 PM EDT ----- Please inform patient that her laboratory test results are mostly within normal limit except for hemoglobin concentration which is slightly low and may be due to iron deficiency. Recommend over-the-counter iron supplement.

## 2014-05-20 ENCOUNTER — Ambulatory Visit (INDEPENDENT_AMBULATORY_CARE_PROVIDER_SITE_OTHER): Payer: Self-pay | Admitting: Emergency Medicine

## 2014-05-20 ENCOUNTER — Encounter: Payer: Self-pay | Admitting: Emergency Medicine

## 2014-05-20 VITALS — BP 120/80 | HR 80 | Ht 64.0 in | Wt 188.0 lb

## 2014-05-20 DIAGNOSIS — A310 Pulmonary mycobacterial infection: Secondary | ICD-10-CM

## 2014-05-20 DIAGNOSIS — D869 Sarcoidosis, unspecified: Secondary | ICD-10-CM

## 2014-05-20 NOTE — Assessment & Plan Note (Signed)
She has tolerated a 3 drug regimen for a year. I think it's time to stop these medications and recheck her CT scan to look for interval change.

## 2014-05-20 NOTE — Patient Instructions (Signed)
We will stop your antibiotics. Please stop the rifampin, ethambutol, and clarithromycin now We will repeat her CT scan of the chest to evaluate changes since you have completed 1 year of antibiotics I agree that he needs to have the nodules on her left index finger biopsied and/or removed. Your CT scan, lab work, biopsies are suggestive of sarcoidosis.  Follow with Dr Delton CoombesByrum in 1 month

## 2014-05-20 NOTE — Progress Notes (Signed)
Subjective:    Patient ID: Sarah Arnold, female    DOB: 1980/05/10, 34 y.o.   MRN: [id number]  HPI 34 yo never smoker, referred for evaluation for chronic rhinitis and cough. This became more problematic about 4-5 months ago when she was pregnant with her 6th child. She delivered in 9/15 but has continued to experience progressive yellow mucous production in addition to her chronic rhinitis. She was seen in the ED 3 weeks ago and then referred here.   Autoimmune screen was performed 02/11/13 by Dr Doreene Burke > negative ESR, ANA, ANCA, dsDNA. Her ACE level was elevated at 97. HIV 07/2011 > negative. No known TB exposure.   CT chest 02/13/13 > severe bronchiectasis, peribronchovascular GGI and micronodular disease. No honeycombing.   ROV 04/10/13 -- follows for cough and severe bronchiectasis and micronodular disease. She is still coughing, produces sputum. No SOB. She does have nasal congestion.   ROV 05/16/13 -- follows for follows for cough and severe bronchiectasis and micronodular disease. Her FOB showed non-necrotizing granulomas, cx's negative so far but her prior AFB cx have now grown out [facility].   Results: sputum AFB negative, fungal negative Quant Gold > negative IgE > 933 Aspergillus fungal panel >> negative Endobronchial biopsies >> non caseating granulomas, AFB stain negative Sputum AFB 03/25/13 >> POSITIVE FOR MAIC BAL AFB 04/17/13 >> Negative so far >>   ROV 06/19/13 -- follow up for bronchiectasis and MAIC. We started clarithro + ethambutol + rifampin. She has tolerated well, but the cost is high and is a concern. She is looking at different pharmacies to see if she can get cheaper. She is coughing, bringing up light yellow. Feels the same.  ROV 10/15/13 -- follow up for [facility] and associated bronchiectasis. Tolerating 3 drug regimen. She has been on for 4 months. No side effects noted. Cough is better.   ROV 05/20/14 -- follow up visit for bronchiectasis, MAIC positive in sputum. She has  been on ethambutol, rifampin and clarithro. She has been given dx of sarcoidosis, although this is not definitive based on her FOB and bx's > her granulomas may be due to [facility]. She does have an elevated ACE level in the past. She notes 2 raised nodules on her L index finger, suspicious for sarcoidosis. She reports some rare cough - much better than last year. No fevers, no chills.      Review of Systems  Constitutional: Negative for fever and unexpected weight change.  HENT: Positive for congestion and postnasal drip. Negative for dental problem, ear pain, nosebleeds, rhinorrhea, sinus pressure, sneezing, sore throat and trouble swallowing.   Eyes: Negative for redness and itching.  Respiratory: Positive for cough. Negative for chest tightness, shortness of breath and wheezing.   Cardiovascular: Negative for palpitations and leg swelling.  Gastrointestinal: Negative for nausea and vomiting.  Genitourinary: Negative for dysuria.  Musculoskeletal: Negative for joint swelling.  Skin: Negative for rash.  Neurological: Negative for headaches.  Psychiatric/Behavioral: Negative for dysphoric mood. The patient is not nervous/anxious.        Objective:   Physical Exam Filed Vitals:   05/20/14 0911  BP: 120/80  Pulse: 80  Height: 5' 4"  (1.626 m)  Weight: 188 lb (85.276 kg)  SpO2: 99%   Gen: Pleasant, well-nourished, in no distress,  normal affect  ENT: No lesions,  mouth clear,  oropharynx clear, no postnasal drip  Neck: No JVD, no TMG, no carotid bruits  Lungs: No use of accessory muscles, no dullness to percussion, clear  without rales or rhonchi  Cardiovascular: RRR, heart sounds normal, no murmur or gallops, no peripheral edema  Musculoskeletal: No deformities, no cyanosis or clubbing  Neuro: alert, non focal  Skin: Warm, no lesions or rashes  03/17/14 --  COMPARISON: 02/13/2013.  FINDINGS: Mediastinum/Nodes: No pathologically enlarged mediastinal lymph nodes. Hilar  regions are difficult to definitively evaluate without IV contrast. No axillary adenopathy. Heart size normal. No pericardial effusion.  Lungs/Pleura: There is a rather diffuse pattern of peribronchovascular nodularity with fissural involvement, mild architectural distortion and scattered bronchiectasis. Findings appear more organized than on 02/13/2013. No pleural fluid. Pleural thickening in the posterior right hemi thorax. Airway is otherwise unremarkable.  Upper abdomen: Visualized portions of the liver, adrenal glands, kidneys, spleen, pancreas, stomach and bowel are grossly unremarkable.  Musculoskeletal: No worrisome lytic or sclerotic lesions.  IMPRESSION: Pulmonary parenchymal pattern of perilymphatic nodularity, mild architectural distortion and scattered bronchiectasis is suggestive of sarcoid.       Assessment & Plan:  Sarcoidosis Suspected sarcoidosis based on granulomatous disease on biopsy (which could be caused only by [facility]), ACE level. Now she has evidence of nodular disease are left index finger. I think that she should have this biopsied in order to definitively establish diagnosis of sarcoidosis. She may require immunosuppression depending on her CT scan results - we will have to balance this with her risk for recurrent mycobacterial disease   MAIC (mycobacterium avium-intracellulare complex) She has tolerated a 3 drug regimen for a year. I think it's time to stop these medications and recheck her CT scan to look for interval change.

## 2014-05-20 NOTE — Assessment & Plan Note (Signed)
Suspected sarcoidosis based on granulomatous disease on biopsy (which [facility]), ACE level. Now she has evidence of nodular disease are left index finger. I think that she should have this biopsied in order to definitively establish diagnosis of sarcoidosis. She may require immunosuppression depending on her CT scan results - we will have to balance this with her risk for recurrent mycobacterial disease

## 2014-05-27 ENCOUNTER — Ambulatory Visit (INDEPENDENT_AMBULATORY_CARE_PROVIDER_SITE_OTHER)
Admission: RE | Admit: 2014-05-27 | Discharge: 2014-05-27 | Disposition: A | Discharge status: Home / Self Care | Payer: No Typology Code available for payment source | Source: Ambulatory Visit | Attending: Emergency Medicine | Admitting: Emergency Medicine

## 2014-05-27 DIAGNOSIS — A310 Pulmonary mycobacterial infection: Secondary | ICD-10-CM

## 2014-06-19 ENCOUNTER — Encounter (INDEPENDENT_AMBULATORY_CARE_PROVIDER_SITE_OTHER): Payer: Self-pay

## 2014-06-19 ENCOUNTER — Encounter: Payer: Self-pay | Admitting: Emergency Medicine

## 2014-06-19 ENCOUNTER — Ambulatory Visit (INDEPENDENT_AMBULATORY_CARE_PROVIDER_SITE_OTHER): Payer: Self-pay | Admitting: Emergency Medicine

## 2014-06-19 VITALS — BP 128/70 | HR 78 | Wt 187.0 lb

## 2014-06-19 DIAGNOSIS — A310 Pulmonary mycobacterial infection: Secondary | ICD-10-CM

## 2014-06-19 DIAGNOSIS — D869 Sarcoidosis, unspecified: Secondary | ICD-10-CM

## 2014-06-19 DIAGNOSIS — R229 Localized swelling, mass and lump, unspecified: Secondary | ICD-10-CM

## 2014-06-19 DIAGNOSIS — J479 Bronchiectasis, uncomplicated: Secondary | ICD-10-CM

## 2014-06-19 NOTE — Progress Notes (Signed)
Subjective:    Patient ID: Sarah Arnold, female    DOB: [birth year], 34 y.o.   MRN: [id number]  HPI 34 y.o. never smoker, referred for evaluation for chronic rhinitis and cough. This became more problematic about 33 months ago when she was pregnant with her 6th child. She delivered in 9/15 but has continued to experience progressive yellow mucous production in addition to her chronic rhinitis. She was seen in the ED 3 weeks ago and then referred here.   Autoimmune screen was performed 02/11/13 by Dr Doreene Burke > negative ESR, ANA, ANCA, dsDNA. Her ACE level was elevated at 97. HIV 07/2011 > negative. No known TB exposure.   CT chest 02/13/13 > severe bronchiectasis, peribronchovascular GGI and micronodular disease. No honeycombing.   ROV 04/10/13 -- follows for cough and severe bronchiectasis and micronodular disease. She is still coughing, produces sputum. No SOB. She does have nasal congestion.   ROV 05/16/13 -- follows for follows for cough and severe bronchiectasis and micronodular disease. Her FOB showed non-necrotizing granulomas, cx's negative so far but her prior AFB cx have now grown out [facility].   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 We started clarithro + ethambutol + rifampin. She has tolerated well, but the cost is high and is a concern. She is looking at different pharmacies to see if she can get cheaper. She is coughing, bringing up light yellow. Feels the same.  ROV 10/15/13 -- follow up for [facility] and associated bronchiectasis. Tolerating 3 drug regimen. She has been on for 4 months. No side effects noted. Cough is better.   ROV 05/20/14 -- follow up visit for bronchiectasis, MAIC positive in sputum.  She has been on ethambutol, rifampin and clarithro. She has been given dx of sarcoidosis, although this is not definitive based on her FOB and bx's > her granulomas may be due to [facility]. She does have an elevated ACE level in the past. She notes 2 raised nodules on her L index finger, suspicious for sarcoidosis. She reports some rare cough - much better than last year. No fevers, no chills.   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 She's been treated with ethambutol plus rifampin plus clarithromycin for one year. At her last visit we stopped her Antibiotics and planned for reimaging. CT chest was performed on 05/27/14 he reviewed the images myself. There is little change compared with February of this year. There is bronchial wall thickening and cylindrical bronchiectasis particularly in the right lower lobe with some associated groundglass and micro-nodularity. There has been interval improvement compared with January 2015. She reports feeling well, no real change since stopping abx, still has some cough and scant mucous. She isn't sure she can cough enough to give a sample. No bx done on her L index finger yet.    Review of Systems  Constitutional: Negative for fever and unexpected weight change.  HENT: Positive for congestion and postnasal drip. Negative for dental problem, ear pain, nosebleeds, rhinorrhea, sinus pressure, sneezing, sore throat and trouble swallowing.   Eyes: Negative for redness and itching.  Respiratory: Positive for cough. Negative for chest tightness, shortness of breath and wheezing.   Cardiovascular: Negative for palpitations and leg swelling.  Gastrointestinal: Negative for nausea and vomiting.  Genitourinary: Negative for dysuria.  Musculoskeletal: Negative for joint swelling.  Skin: Negative for rash.  Neurological: Negative for headaches.  Psychiatric/Behavioral: Negative for dysphoric mood. The patient is not  nervous/anxious.        Objective:   Physical Exam Filed Vitals:   06/19/14 0903  BP: 128/70  Pulse: 78  Weight: 187 lb (84.823 kg)  SpO2: 98%   Gen: Pleasant, well-nourished, in no distress,  normal affect  ENT: No lesions,  mouth clear,  oropharynx clear, no postnasal drip  Neck: No JVD, no TMG, no carotid bruits  Lungs: No use of accessory muscles, no dullness to percussion, clear without rales or rhonchi  Cardiovascular: RRR, heart sounds normal, no murmur or gallops, no peripheral edema  Musculoskeletal: No deformities, no cyanosis or clubbing  Neuro: alert, non focal  Skin: Warm, no lesions or rashes  CT chest 05/27/14 --  COMPARISON: Chest CT 03/17/2014.  FINDINGS: Mediastinum/Lymph Nodes: Heart size is normal. There is no significant pericardial fluid, thickening or pericardial calcification. No pathologically enlarged mediastinal or hilar lymph nodes. Please note that accurate exclusion of hilar adenopathy is limited on noncontrast CT scans. Esophagus is unremarkable in appearance. No axillary lymphadenopathy.  Lungs/Pleura: High-resolution images again demonstrate diffuse bronchial wall thickening, with extensive thickening of the peribronchovascular interstitium, patchy areas of cylindrical and mild varicose bronchiectasis (most severe in the right lower lobe), with some rather diffuse peribronchovascular ground-glass attenuation and micronodularity. Some very mild fissural nodularity is also noted. Areas of architectural distortion are again noted, most severe in the right lower and right upper lobes. Inspiratory and expiratory imaging is unremarkable. Overall, the appearance of the lungs is very similar to recent study 03/17/2014, and appears improved (in terms of decreased ground-glass attenuation) compared a remote prior study 02/13/2013. No confluent consolidative airspace disease. No pleural effusions. Chronic right-sided pleural thickening  posteriorly is unchanged.  Upper Abdomen: Unremarkable.  Musculoskeletal/Soft Tissues: There are no aggressive appearing lytic or blastic lesions noted in the visualized portions of the skeleton.  IMPRESSION: 1. Overall, the appearance of the lungs is very similar to prior study 03/17/2014. The spectrum of findings discussed above could all be related to chronic indolent atypical infectious process such as MAI (mycobacterium avium intracellulare), particularly in this patient with prior sputum positivity. While the distribution of disease in this patient is perilymphatic (i.e., a combined peribronchovascular and subpleural distribution constitutes perilymphatic distribution) which can be seen in the setting of sarcoidosis, there is no significant lymphadenopathy, and there is relatively diffuse involvement throughout the lungs without definite mid to upper lung predominance (as is typically seen in the setting of sarcoidosis). Clinical correlation is recommended.     Assessment & Plan:  Sarcoidosis Diagnosis is suspected but unproven - her granulomatous disease could be due to either sarcoid or to [facility]. She has has an elevated ace level in the past. Her nodular changes on her second index finger could represent granulomatous disease and prove the diagnosis. I would like to refer her to dermatology for biopsy. We will need to follow CT scan of the chest to look for involving changes. The CT could represent sarcoidosis alone or could relate to  infection from Mycobacterium   MAIC (mycobacterium avium-intracellulare complex) She has been treated for one year with 3 drug therapy. She has now been off therapy for one month and a repeat CT scan of her chest is similar to February and much improved compared to January 2015. It's difficult  to say whether the residual changes are related to residual bronchiectasis, to persistent mycobacterial disease, or to sarcoidosis (which is yet to be  definitively proven)   Multiple skin nodules I will refer her to dermatology today to biopsy the nodules on her  Left index finger   Bronchiectasis without acute exacerbation We will need to follow serial CT scans. Not certain at this time how frequently we will check. This will depend on her clinical status  And on whether we decide to repeat bronchoscopy for culture data

## 2014-06-19 NOTE — Patient Instructions (Signed)
We will attempt to obtain a sputum sample to screen for any remaining mycobacterial disease.  For now we will remain off of any antibiotics If we are unable to obtain a sputum sample then we can discuss possible repeat bronchoscopy to perform cultures. We will follow your CT scan of the chest overtime to ensure no significant change I will refer you to dermatology to evaluate the nodules on her left index finger. It is possible that these are related to sarcoidosis Follow with Dr Delton CoombesByrum in 2 months or sooner if you have any problems.

## 2014-06-19 NOTE — Addendum Note (Signed)
Addended by: Abigail MiyamotoPHELPS, Davy Faught D on: 06/19/2014 09:32 AM   Modules accepted: Orders

## 2014-06-19 NOTE — Assessment & Plan Note (Signed)
We will need to follow serial CT scans. Not certain at this time how frequently we will check. This will depend on her clinical status  And on whether we decide to repeat bronchoscopy for culture data

## 2014-06-19 NOTE — Assessment & Plan Note (Signed)
I will refer her to dermatology today to biopsy the nodules on her  Left index finger

## 2014-06-19 NOTE — Addendum Note (Signed)
Addended by: Abigail MiyamotoPHELPS, Jerrel Tiberio D on: 06/19/2014 09:34 AM   Modules accepted: Orders

## 2014-06-19 NOTE — Assessment & Plan Note (Signed)
She has been treated for one year with 3 drug therapy. She has now been off therapy for one month and a repeat CT scan of her chest is similar to February and much improved compared to January 2015. It's difficult to say whether the residual changes are related to residual bronchiectasis, to persistent mycobacterial disease, or to sarcoidosis (which is yet to be definitively proven)

## 2014-06-19 NOTE — Assessment & Plan Note (Signed)
Diagnosis is suspected but unproven - her granulomatous disease could be due to either sarcoid or [facility]. She has has an elevated ace level in the past. Her nodular changes on her second index finger could represent granulomatous disease and prove the diagnosis. I would like to refer her to dermatology for biopsy. We will need to follow CT scan of the chest to look for involving changes. The CT could represent sarcoidosis alone or could relate to  infection from Mycobacterium

## 2014-07-03 ENCOUNTER — Ambulatory Visit: Payer: No Typology Code available for payment source | Attending: Internal Medicine

## 2014-07-17 ENCOUNTER — Other Ambulatory Visit: Payer: No Typology Code available for payment source

## 2014-07-17 DIAGNOSIS — A310 Pulmonary mycobacterial infection: Secondary | ICD-10-CM

## 2014-07-20 LAB — RESPIRATORY CULTURE OR RESPIRATORY AND SPUTUM CULTURE: ORGANISM ID, BACTERIA: NORMAL

## 2014-08-19 ENCOUNTER — Ambulatory Visit: Payer: No Typology Code available for payment source | Admitting: Emergency Medicine

## 2014-09-30 ENCOUNTER — Ambulatory Visit: Payer: No Typology Code available for payment source | Admitting: Emergency Medicine

## 2014-10-23 ENCOUNTER — Ambulatory Visit: Payer: No Typology Code available for payment source

## 2015-02-12 ENCOUNTER — Ambulatory Visit: Payer: No Typology Code available for payment source | Attending: Internal Medicine | Admitting: Internal Medicine

## 2015-02-12 ENCOUNTER — Encounter: Payer: Self-pay | Admitting: Internal Medicine

## 2015-02-12 VITALS — BP 117/77 | HR 77 | Temp 98.2°F | Resp 18 | Ht 64.0 in | Wt 197.0 lb

## 2015-02-12 DIAGNOSIS — Z0000 Encounter for general adult medical examination without abnormal findings: Secondary | ICD-10-CM | POA: Insufficient documentation

## 2015-02-12 DIAGNOSIS — D649 Anemia, unspecified: Secondary | ICD-10-CM | POA: Insufficient documentation

## 2015-02-12 DIAGNOSIS — R05 Cough: Secondary | ICD-10-CM | POA: Insufficient documentation

## 2015-02-12 DIAGNOSIS — A310 Pulmonary mycobacterial infection: Secondary | ICD-10-CM | POA: Insufficient documentation

## 2015-02-12 DIAGNOSIS — Z23 Encounter for immunization: Secondary | ICD-10-CM | POA: Insufficient documentation

## 2015-02-12 DIAGNOSIS — R229 Localized swelling, mass and lump, unspecified: Secondary | ICD-10-CM | POA: Insufficient documentation

## 2015-02-12 DIAGNOSIS — R87619 Unspecified abnormal cytological findings in specimens from cervix uteri: Secondary | ICD-10-CM | POA: Insufficient documentation

## 2015-02-12 HISTORY — DX: Encounter for general adult medical examination without abnormal findings: Z00.00

## 2015-02-12 LAB — CBC WITH DIFFERENTIAL/PLATELET
Basophils Absolute: 0.1 10*3/uL (ref 0.0–0.1)
Basophils Relative: 1 % (ref 0–1)
Eosinophils Absolute: 0.7 10*3/uL (ref 0.0–0.7)
Eosinophils Relative: 9 % — ABNORMAL HIGH (ref 0–5)
HEMATOCRIT: 39.1 % (ref 36.0–46.0)
HEMOGLOBIN: 12.3 g/dL (ref 12.0–15.0)
LYMPHS ABS: 2.3 10*3/uL (ref 0.7–4.0)
Lymphocytes Relative: 32 % (ref 12–46)
MCH: 23.8 pg — AB (ref 26.0–34.0)
MCHC: 31.5 g/dL (ref 30.0–36.0)
MCV: 75.8 fL — AB (ref 78.0–100.0)
MONOS PCT: 11 % (ref 3–12)
MPV: 10.3 fL (ref 8.6–12.4)
Monocytes Absolute: 0.8 10*3/uL (ref 0.1–1.0)
NEUTROS ABS: 3.4 10*3/uL (ref 1.7–7.7)
NEUTROS PCT: 47 % (ref 43–77)
Platelets: 341 10*3/uL (ref 150–400)
RBC: 5.16 MIL/uL — ABNORMAL HIGH (ref 3.87–5.11)
RDW: 15.6 % — ABNORMAL HIGH (ref 11.5–15.5)
WBC: 7.3 10*3/uL (ref 4.0–10.5)

## 2015-02-12 LAB — COMPLETE METABOLIC PANEL WITH GFR
ALBUMIN: 4.1 g/dL (ref 3.6–5.1)
ALK PHOS: 80 U/L (ref 33–115)
ALT: 15 U/L (ref 6–29)
AST: 15 U/L (ref 10–30)
BILIRUBIN TOTAL: 0.3 mg/dL (ref 0.2–1.2)
BUN: 17 mg/dL (ref 7–25)
CO2: 26 mmol/L (ref 20–31)
Calcium: 9.7 mg/dL (ref 8.6–10.2)
Chloride: 100 mmol/L (ref 98–110)
Creat: 0.97 mg/dL (ref 0.50–1.10)
GFR, EST AFRICAN AMERICAN: 88 mL/min (ref >=60)
GFR, EST NON AFRICAN AMERICAN: 76 mL/min (ref >=60)
Glucose, Bld: 87 mg/dL (ref 65–99)
Potassium: 4.3 mmol/L (ref 3.5–5.3)
Sodium: 136 mmol/L (ref 135–146)
TOTAL PROTEIN: 7.5 g/dL (ref 6.1–8.1)

## 2015-02-12 NOTE — Progress Notes (Signed)
Sarah Arnold, is a 35 y.o. female  WUJ:[id number]  NFA:[id number]  DOB - May 18, 1980  Chief Complaint  Patient presents with  . Cough        Subjective:   Sarah Arnold is a 35 y.o. female with history of chronic rhinitis and cough here today for a follow up visit. Patient is complaining of ongoing sneezing and coughing for more than 2 months. Cough has reduced and less productive. Previous workup so far showed MAIC infection in the sputum , patient was treated with triple antibiotics for one year. Patient  Has a differential diagnosis of sarcoidosis from her CT chest which showed severe bronchiectasis and peribronchial vascular G GI and micronodular disease but no honeycombing. She has no shortness of breath. She sleeps well. She currently does not work but taken off all has 6 children , the oldest is 13 years old and the youngest 34 years old Patient has No headache, No chest pain, No abdominal pain - No Nausea, No new weakness tingling or numbness, SOB. She has no fever, no night sweat. She has a follow-up appointment with pulmonologist on February 3. She also has an appointment with dermatologist for excisional biopsy of her left index finger nodules.  Results: sputum AFB negative, fungal negative Quant Gold > negative IgE > 933 Aspergillus fungal panel >> negative Endobronchial biopsies >> non caseating granulomas, AFB stain negative Sputum AFB 03/25/13 >> POSITIVE FOR MAIC BAL AFB 04/17/13 >> Negative so far >> negative  CT Chest of May 2016:  IMPRESSION: 1. Overall, the appearance of the lungs is very similar to prior study 03/17/2014. The spectrum of findings discussed above could all be related to chronic indolent atypical infectious process such as MAI (mycobacterium avium intracellulare), particularly in this patient with prior sputum positivity. While the distribution of disease in this patient is perilymphatic (i.e., a combined peribronchovascular and subpleural  distribution constitutes perilymphatic distribution) which can be seen in the setting of sarcoidosis, there is no significant lymphadenopathy, and there is relatively diffuse involvement throughout the lungs without definite mid to upper lung predominance (as is typically seen in the setting of sarcoidosis). Clinical correlation is recommended  Problem  Healthcare Maintenance    ALLERGIES: No Known Allergies  PAST MEDICAL HISTORY: Past Medical History  Diagnosis Date  . Pneumococcal pneumonia (HCC)   . Anemia   . Abnormal Pap smear   . Late prenatal care     MEDICATIONS AT HOME: Prior to Admission medications   Not on File     Objective:   Filed Vitals:   02/12/15 1006  BP: 117/77  Pulse: 77  Temp: 98.2 F (36.8 C)  TempSrc: Oral  Resp: 18  Height:  (1.626 m)  Weight: 197 lb (89.359 kg)  SpO2: 100%    Exam General appearance : Awake, alert, not in any distress. Speech Clear. Not toxic looking HEENT: Atraumatic and Normocephalic, pupils equally reactive to light and accomodation Neck: supple, no JVD. No cervical lymphadenopathy.  Chest:Good air entry bilaterally, no added sounds  CVS: S1 S2 regular, no murmurs.  Abdomen: Bowel sounds present, Non tender and not distended with no gaurding, rigidity or rebound. Extremities:  No dose on the left index finger posteriorly. B/L Lower Ext shows no edema, both legs are warm to touch Neurology: Awake alert, and oriented X 3, CN II-XII intact, Non focal Skin:No Rash  Data Review Lab Results  Component Value Date   HGBA1C 5.90 05/15/2014     Assessment & Plan  1. Healthcare maintenance  - Flu Vaccine QUAD 36+ mos PF IM (Fluarix & Fluzone Quad PF)  2. Multiple skin nodules  - Sedimentation Rate - Follow-up with dermatologist for excisional biopsy as scheduled  3. MAIC (mycobacterium avium-intracellulare complex) (HCC) -  Patient has completed 1 year 3-drug regimen Will check: - Sedimentation Rate  to compare with previous results - CBC with Differential/Platelet - COMPLETE METABOLIC PANEL WITH GFR - Follow-up with pulmonologist on February 3 as scheduled  Patient have been counseled extensively about nutrition and exercise  Return in about 3 months (around 05/13/2015) for Chronic Cough.  The patient was given clear instructions to go to ER or return to medical center if symptoms don't improve, worsen or new problems develop. The patient verbalized understanding. The patient was told to call to get lab results if they haven't heard anything in the next week.   This note has been created with Education officer, environmental. Any transcriptional errors are unintentional.    Jeanann Lewandowsky, MD, MHA, Maxwell Caul, CPE [facility] and [facility] [place], [place] [phone]   02/12/2015, 11:03 AM

## 2015-02-12 NOTE — Patient Instructions (Signed)
Bronchiectasis Bronchiectasis is a condition in which the airways (bronchi) are damaged and widened. This makes it difficult for the lungs to get rid of mucus. As a result, mucus gathers in the airways, and this often leads to lung infections. Infection can cause inflammation in the airways, which may further weaken and damage the bronchi.  CAUSES  Bronchiectasis may be present at birth (congenital) or may develop later in life. Sometimes there is no apparent cause. Some common causes include:  Cystic fibrosis.   Recurrent lung infections (such as pneumonia, tuberculosis, or fungal infections).  Foreign bodies or other blockages in the lungs.  Breathing in fluid, food, or other foreign objects (aspiration). SIGNS AND SYMPTOMS  Common symptoms include:  A daily cough that brings up mucus and lasts for more than 3 weeks.  Frequent lung infections (such as pneumonia, tuberculosis, or fungal infections).  Shortness of breath and wheezing.   Weakness and fatigue. DIAGNOSIS  Various tests may be done to help diagnose bronchiectasis. Tests may include:  Chest X-rays or CT scans.   Breathing tests to help determine how your lungs are working.   Sputum cultures to check for infection.   Blood tests and other tests to check for related diseases or causes, such as cystic fibrosis. TREATMENT  Treatment varies depending on the severity of the condition. Medicines may be given to loosen the mucus to be coughed up (expectorants), to relax the muscles of the air passages (bronchodilators), or to prevent or treat infections (antibiotics). Physical therapy methods may be recommended to help clear mucus from the lungs. For severe cases, surgery may be done to remove the affected part of the lung. HOME CARE INSTRUCTIONS   Get plenty of rest.   Only take over-the-counter or prescription medicines as directed by your health care provider. If antibiotic medicines were prescribed, take them as  directed. Finish them even if you start to feel better.  Avoid sedatives and antihistamines unless otherwise directed by your health care provider. These medicines tend to thicken the mucus in the lungs.   Perform any breathing exercises or techniques to clear the lungs as directed by your health care provider.  Drink enough fluids to keep your urine clear or pale yellow.  Consider using a cold steam vaporizer or humidifier in your room or home to help loosen secretions.   If the cough is worse at night, try sleeping in a semi-upright position in a recliner or using a couple of pillows.   Avoid cigarette smoke and lung irritants. If you smoke, quit.  Stay inside when pollution and ozone levels are high.   Stay current with vaccinations and immunizations.   Follow up with your health care provider as directed.  SEEK MEDICAL CARE IF:  You cough up more thick, discolored mucus (sputum) that is yellow to green in color.  You have a fever or persistent symptoms for more than 2-3 days.  You cannot control your cough and are losing sleep. SEEK IMMEDIATE MEDICAL CARE IF:   You cough up blood.   You have chest pain or increasing shortness of breath.   You have pain that is getting worse or is uncontrolled with medicines.   You have a fever and your symptoms suddenly get worse. MAKE SURE YOU:  Understand these instructions.   Will watch your condition.   Will get help right away if you are not doing well or get worse.    This information is not intended to replace advice given to   you by your health care provider. Make sure you discuss any questions you have with your health care provider.   Document Released: 11/07/2006 Document Revised: 01/15/2013 Document Reviewed: 07/18/2012 Elsevier Interactive Patient Education 2016 [employer]  

## 2015-02-12 NOTE — Progress Notes (Signed)
Patient here for cough.  Patient complains of constant cough and runny nose. Patient states the symptoms come and go but are year round. Patient states cough is productive and secretions are clear and sometimes green mucous.  Patient would like her flu shot today.  Patient states pain is no longer present.  Patient tolerated flu injection well

## 2015-02-13 LAB — SEDIMENTATION RATE: Sed Rate: 12 mm/hr (ref 0–20)

## 2015-02-18 ENCOUNTER — Telehealth: Payer: Self-pay | Admitting: *Deleted

## 2015-02-18 NOTE — Telephone Encounter (Signed)
Patient verified DOB Patient informed of her lab results being mostly normal. Patient advised to continue symptom management of sneezing and coughing,; as well as, to avoid any triggering factors. Patient advised to contact the office immediately if she develops a fever or chest pain. Patient expressed her understanding and had no further questions or concerns at this time.

## 2015-02-18 NOTE — Telephone Encounter (Signed)
-----   Message from Quentin Angst, MD sent at 02/16/2015  7:16 PM EST ----- Please inform patient about her lab results are mostly normal. Continue symptomatic management of sneezing and coughing. Avoid the cold and triggering factors. Report immediately if fever or chest pain develops.

## 2015-02-27 ENCOUNTER — Ambulatory Visit: Payer: No Typology Code available for payment source | Admitting: Emergency Medicine

## 2015-04-03 ENCOUNTER — Ambulatory Visit: Payer: No Typology Code available for payment source | Admitting: Emergency Medicine

## 2015-04-13 IMAGING — CR DG CHEST 2V
2 series · 2 of 2 positions shown · non-contrast
Comparison: August 15, 2011.

CLINICAL DATA: Cough.

EXAM:
CHEST  2 VIEW

[view not recorded (1 of 2)]
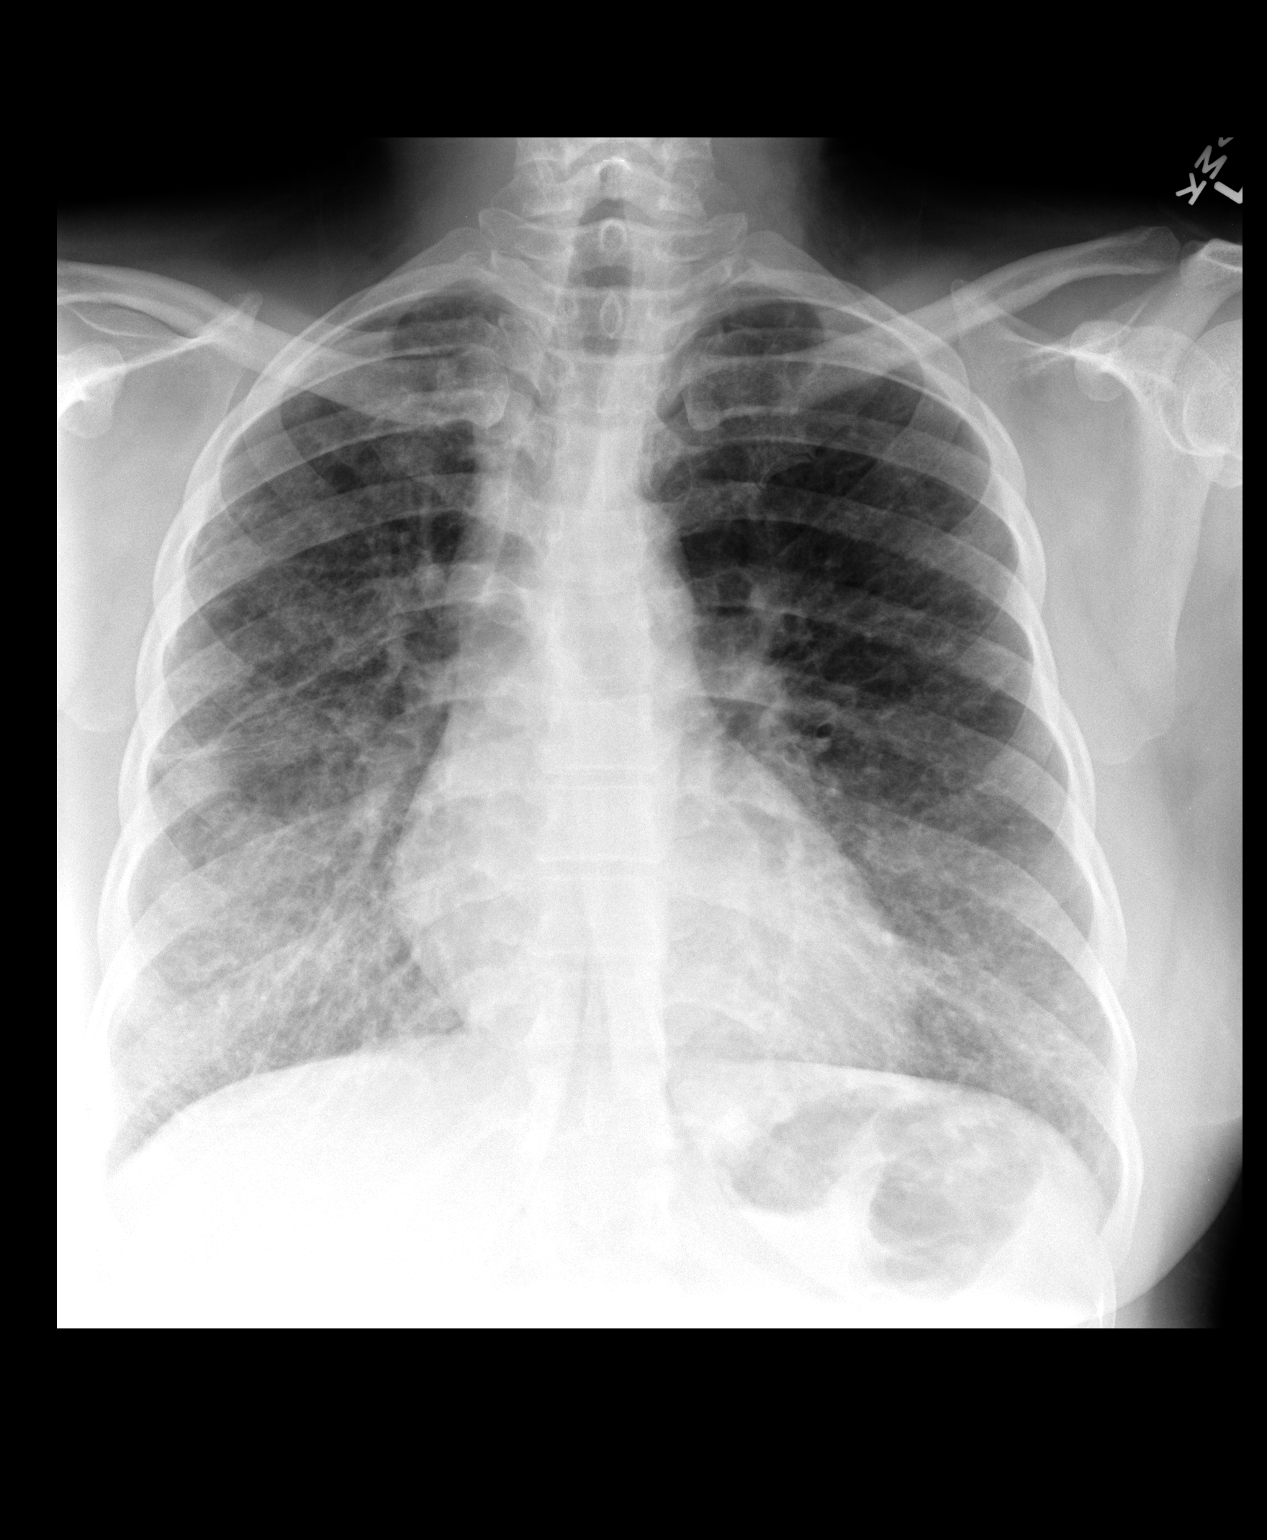

[view not recorded (2 of 2)]
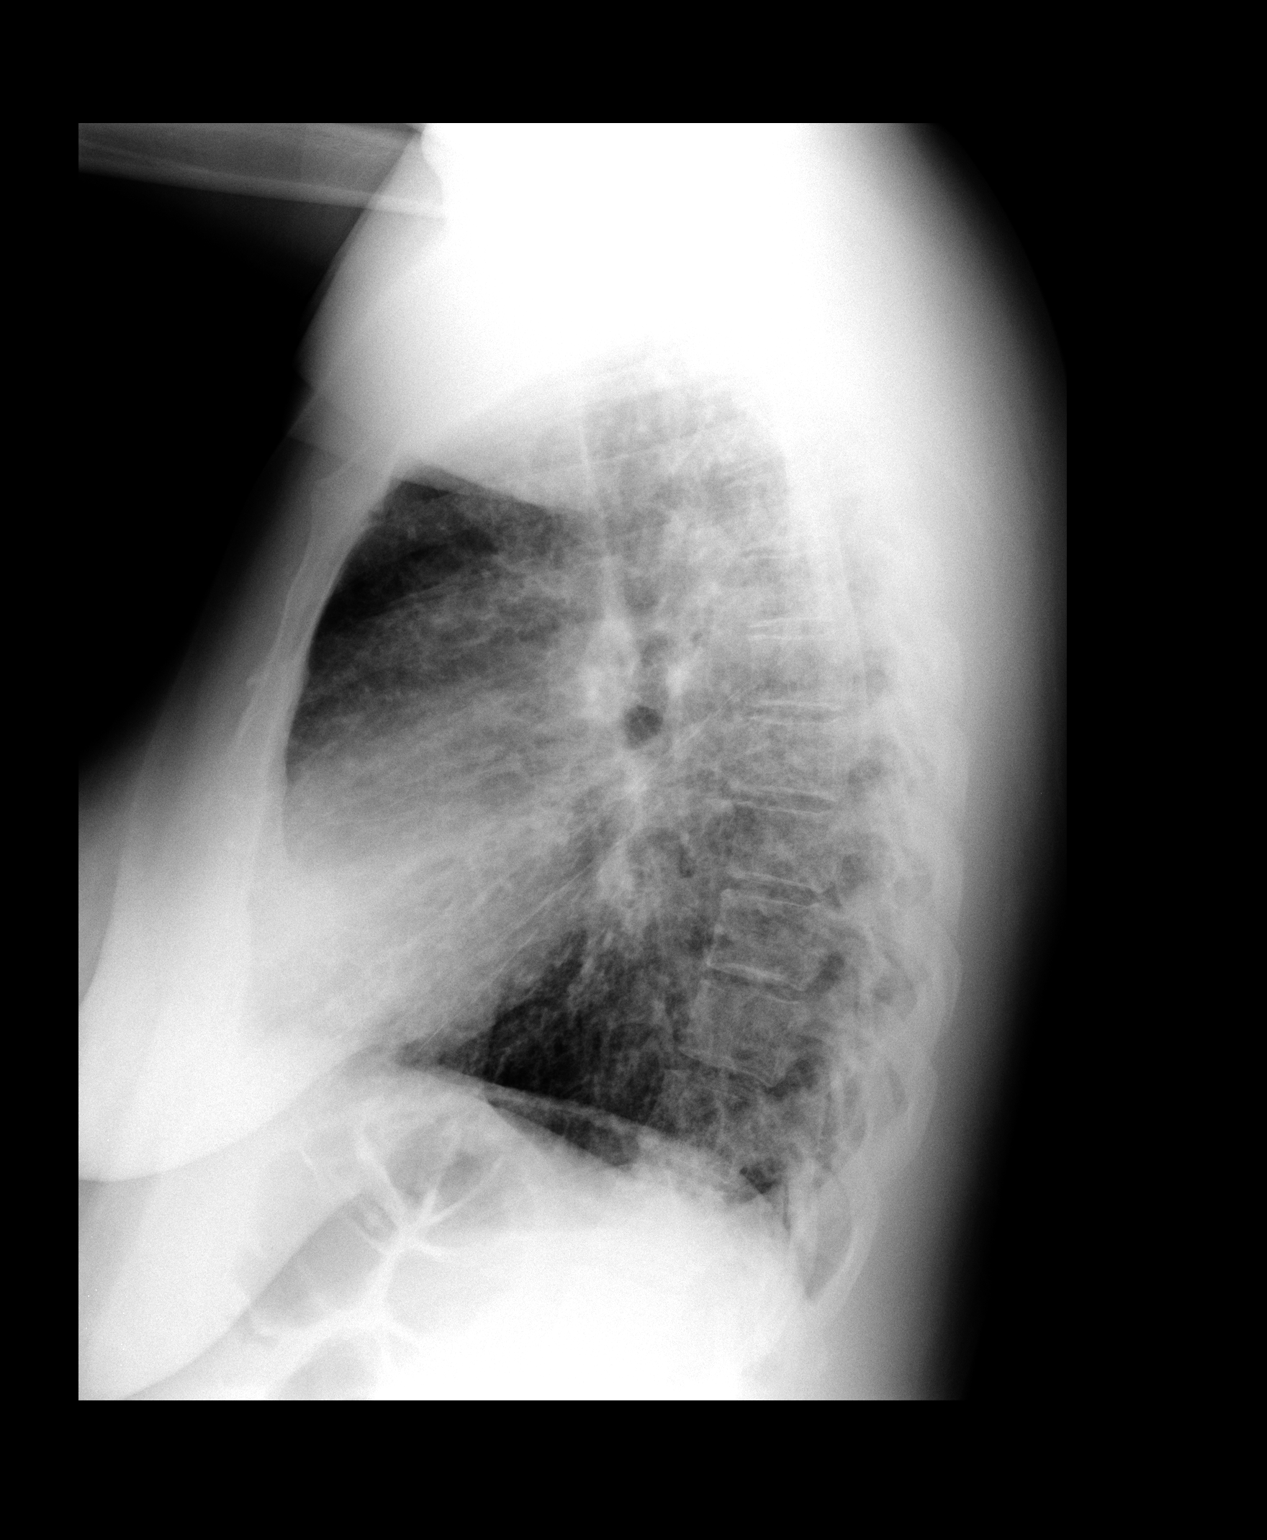

[2 of 2 positions shown; findings below may reference images not displayed]

FINDINGS: Cardiomediastinal silhouette appears normal. No pleural effusion or
pneumothorax is noted. Diffuse reticulonodular densities are seen
throughout both lungs which were present on prior exam and most
likely represents chronic scarring or fibrosis. Bony thorax is
intact.
IMPRESSION: Diffuse reticulonodular densities seen throughout both lungs which
were present on prior exam and most likely represent chronic
scarring or fibrosis, or possibly chronic interstitial pneumonitis
or histoplasmosis.

## 2015-05-29 ENCOUNTER — Ambulatory Visit: Payer: No Typology Code available for payment source | Admitting: Emergency Medicine

## 2015-06-09 ENCOUNTER — Other Ambulatory Visit: Payer: No Typology Code available for payment source

## 2015-06-09 ENCOUNTER — Encounter: Payer: Self-pay | Admitting: Emergency Medicine

## 2015-06-09 ENCOUNTER — Ambulatory Visit (INDEPENDENT_AMBULATORY_CARE_PROVIDER_SITE_OTHER): Payer: No Typology Code available for payment source | Admitting: Emergency Medicine

## 2015-06-09 VITALS — BP 122/84 | HR 82 | Ht 64.0 in | Wt 194.0 lb

## 2015-06-09 DIAGNOSIS — A310 Pulmonary mycobacterial infection: Secondary | ICD-10-CM

## 2015-06-09 DIAGNOSIS — D869 Sarcoidosis, unspecified: Secondary | ICD-10-CM

## 2015-06-09 DIAGNOSIS — J479 Bronchiectasis, uncomplicated: Secondary | ICD-10-CM

## 2015-06-09 DIAGNOSIS — J309 Allergic rhinitis, unspecified: Secondary | ICD-10-CM

## 2015-06-09 NOTE — Assessment & Plan Note (Signed)
Repeat CT No schedule bronchodilators at this time

## 2015-06-09 NOTE — Assessment & Plan Note (Signed)
This diagnosis is not entirely clear. We will obtain her skin biopsy that was done on her left finger [facility] [place]. She may merit repeat lung biopsy at some point. The presence of mycobacterial disease will influence and confuse these results.

## 2015-06-09 NOTE — Assessment & Plan Note (Signed)
Repeat CT scan of the chest. We will also repeat sputum culture for AFB. If we can obtain useful information from this then we will be able to avoid bronchoscopy. If we are unable to get good culture data then bronchoscopy may be indicated.

## 2015-06-09 NOTE — Patient Instructions (Addendum)
Continue zyrtec 10mg . Start taking this every day during the Spring and fall months.  [facility] [place]  We will repeat your CT scan of the chest to compare with 2016.  We will perform a sputum culture to look for mycobacterial colonization.  Follow with Dr Delton CoombesByrum next available to discuss the test results.

## 2015-06-09 NOTE — Progress Notes (Signed)
Subjective:    Patient ID: Gagandeep Horatio Pel, female    DOB: 1980-12-09, 35 y.o.   MRN: [id number]  HPI 35 yo never smoker, referred for evaluation for chronic rhinitis and cough. This became more problematic about 4-5 months ago when she was pregnant with her 6th child. She delivered in 9/15 but has continued to experience progressive yellow mucous production in addition to her chronic rhinitis. She was seen in the ED 3 weeks ago and then referred here.   Autoimmune screen was performed 02/11/13 by Dr Doreene Burke > negative ESR, ANA, ANCA, dsDNA. Her ACE level was elevated at 97. HIV 07/2011 > negative. No known TB exposure.   CT chest 02/13/13 > severe bronchiectasis, peribronchovascular GGI and micronodular disease. No honeycombing.   ROV 04/10/13 -- follows for cough and severe bronchiectasis and micronodular disease. She is still coughing, produces sputum. No SOB. She does have nasal congestion.   ROV 05/16/13 -- follows for follows for cough and severe bronchiectasis and micronodular disease. Her FOB showed non-necrotizing granulomas, cx's negative so far but her prior AFB cx have now grown out [facility].   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 We started clarithro + ethambutol + rifampin. She has tolerated well, but the cost is high and is a concern. She is looking at different pharmacies to see if she can get cheaper. She is coughing, bringing up light yellow. Feels the same.  ROV 10/15/13 -- follow up for [facility] and associated bronchiectasis. Tolerating 3 drug regimen. She has been on for 4 months. No side effects noted. Cough is better.   ROV 05/20/14 -- follow up visit for bronchiectasis, MAIC positive in sputum.  She has been on ethambutol, rifampin and clarithro. She has been given dx of sarcoidosis, although this is not definitive based on her FOB and bx's > her granulomas may be due to [facility]. She does have an elevated ACE level in the past. She notes 2 raised nodules on her L index finger, suspicious for sarcoidosis. She reports some rare cough - much better than last year. No fevers, no chills.   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 She's been treated with ethambutol plus rifampin plus clarithromycin for one year. At her last visit we stopped her Antibiotics and planned for reimaging. CT chest was performed on 05/27/14 he reviewed the images myself. There is little change compared with February of this year. There is bronchial wall thickening and cylindrical bronchiectasis particularly in the right lower lobe with some associated groundglass and micro-nodularity. There has been interval improvement compared with January 2015. She reports feeling well, no real change since stopping abx, still has some cough and scant mucous. She isn't sure she can cough enough to give a sample. No bx done on her L index finger yet.   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 She was treated for one year for her [facility]. Her last CT scan of the chest was 05/27/14 as detailed above. We have discussed possibly doing a skin biopsy on her left index finger - this was done in [employer], [place] but we do  not have the results here. She states that her breathing has been doing well. She does have occasional cough, has been more bothersome this allergy season. Non-productive.    Review of Systems  Constitutional: Negative for fever and unexpected weight change.  HENT: Positive for congestion and postnasal drip. Negative for dental problem, ear pain, nosebleeds,  rhinorrhea, sinus pressure, sneezing, sore throat and trouble swallowing.   Eyes: Negative for redness and itching.  Respiratory: Positive for cough. Negative for chest tightness, shortness of breath and wheezing.   Cardiovascular: Negative for palpitations and leg swelling.  Gastrointestinal: Negative for nausea and vomiting.  Genitourinary: Negative for dysuria.  Musculoskeletal: Negative for joint swelling.  Skin: Negative for rash.  Neurological: Negative for headaches.  Psychiatric/Behavioral: Negative for dysphoric mood. The patient is not nervous/anxious.        Objective:   Physical Exam Filed Vitals:   06/09/15 1105  BP: 122/84  Pulse: 82  Height: _0  (1.626 m)  Weight: 194 lb (87.998 kg)  SpO2: 99%   Gen: Pleasant, well-nourished, in no distress,  normal affect  ENT: No lesions,  mouth clear,  oropharynx clear, no postnasal drip  Neck: No JVD, no TMG, no carotid bruits  Lungs: No use of accessory muscles, no dullness to percussion, clear without rales or rhonchi  Cardiovascular: RRR, heart sounds normal, no murmur or gallops, no peripheral edema  Musculoskeletal: No deformities, no cyanosis or clubbing  Neuro: alert, non focal  Skin: Warm, no lesions or rashes  CT chest 05/27/14 --  COMPARISON: Chest CT 03/17/2014.  FINDINGS: Mediastinum/Lymph Nodes: Heart size is normal. There is no significant pericardial fluid, thickening or pericardial calcification. No pathologically enlarged mediastinal or hilar lymph nodes. Please note that accurate exclusion of hilar adenopathy is limited on noncontrast CT scans. Esophagus is unremarkable in appearance. No axillary lymphadenopathy.  Lungs/Pleura: High-resolution images again demonstrate diffuse bronchial wall thickening, with extensive thickening of the peribronchovascular interstitium, patchy areas of cylindrical and mild varicose bronchiectasis (most severe in the right lower lobe), with some rather diffuse  peribronchovascular ground-glass attenuation and micronodularity. Some very mild fissural nodularity is also noted. Areas of architectural distortion are again noted, most severe in the right lower and right upper lobes. Inspiratory and expiratory imaging is unremarkable. Overall, the appearance of the lungs is very similar to recent study 03/17/2014, and appears improved (in terms of decreased ground-glass attenuation) compared a remote prior study 02/13/2013. No confluent consolidative airspace disease. No pleural effusions. Chronic right-sided pleural thickening posteriorly is unchanged.  Upper Abdomen: Unremarkable.  Musculoskeletal/Soft Tissues: There are no aggressive appearing lytic or blastic lesions noted in the visualized portions of the skeleton.  IMPRESSION: 1. Overall, the appearance of the lungs is very similar to prior study 03/17/2014. The spectrum of findings discussed above could all be related to chronic indolent atypical infectious process such as MAI (mycobacterium avium intracellulare), particularly in this patient with prior sputum positivity. While the distribution of disease in this patient is perilymphatic (i.e., a combined peribronchovascular and subpleural distribution constitutes perilymphatic distribution) which can be seen in the setting of sarcoidosis, there is no significant lymphadenopathy, and there is relatively diffuse involvement throughout the lungs without definite mid to upper lung predominance (as is typically seen in the setting of sarcoidosis). Clinical correlation is recommended.     Assessment & Plan:  Sarcoidosis This diagnosis is not entirely clear. We will obtain her skin biopsy that was done on her left finger from [facility] in [facility]. She  may merit repeat lung biopsy at some point. The presence of mycobacterial disease will influence and confuse these results.  MAIC (mycobacterium avium-intracellulare  complex) Repeat CT scan of the chest. We will also repeat sputum culture for AFB. If we can obtain useful information from this then we will be able to avoid bronchoscopy. If we are unable to get good culture data then bronchoscopy may be indicated.  Bronchiectasis without acute exacerbation Repeat CT No schedule bronchodilators at this time  Allergic rhinitis Change Zyrtec to every day during the allergy season

## 2015-06-09 NOTE — Addendum Note (Signed)
Addended by: Jaynee EaglesLEMONS, Viet Kemmerer C on: 06/09/2015 11:31 AM   Modules accepted: Orders

## 2015-06-09 NOTE — Assessment & Plan Note (Signed)
Change Zyrtec to every day during the allergy season

## 2015-06-12 ENCOUNTER — Ambulatory Visit (INDEPENDENT_AMBULATORY_CARE_PROVIDER_SITE_OTHER)
Admission: RE | Admit: 2015-06-12 | Discharge: 2015-06-12 | Disposition: A | Discharge status: Home / Self Care | Payer: No Typology Code available for payment source | Source: Ambulatory Visit | Attending: Emergency Medicine | Admitting: Emergency Medicine

## 2015-06-12 DIAGNOSIS — J479 Bronchiectasis, uncomplicated: Secondary | ICD-10-CM

## 2015-07-23 ENCOUNTER — Ambulatory Visit (INDEPENDENT_AMBULATORY_CARE_PROVIDER_SITE_OTHER): Payer: No Typology Code available for payment source | Admitting: Emergency Medicine

## 2015-07-23 ENCOUNTER — Encounter: Payer: Self-pay | Admitting: Emergency Medicine

## 2015-07-23 VITALS — BP 110/86 | HR 78 | Ht 64.0 in | Wt 196.0 lb

## 2015-07-23 DIAGNOSIS — D869 Sarcoidosis, unspecified: Secondary | ICD-10-CM

## 2015-07-23 DIAGNOSIS — J309 Allergic rhinitis, unspecified: Secondary | ICD-10-CM

## 2015-07-23 DIAGNOSIS — A310 Pulmonary mycobacterial infection: Secondary | ICD-10-CM

## 2015-07-23 MED ORDER — FLUTICASONE PROPIONATE 50 MCG/ACT NA SUSP: 2.0000 | Freq: Every day | NASAL | Status: DC

## 2015-07-23 NOTE — Patient Instructions (Addendum)
Your CT scan of the chest is stable compared with your prior study. No new findings. There are still areas of inflammation present.  Your sputum culture is negative so far. We will follow it until finalized.  We do not need to start any antibiotics at this time Please continue zyrtec daily Try starting fluticasone nasal spray, 2 sprays each nostril once a day.  Follow with Dr Delton CoombesByrum in 6 months or sooner if you have any problems

## 2015-07-23 NOTE — Assessment & Plan Note (Signed)
Try adding fluticasone nasal spray to zyrtec once a day

## 2015-07-23 NOTE — Assessment & Plan Note (Signed)
Your sputum culture is negative so far. We will follow it until finalized.  We do not need to start any antibiotics at this time

## 2015-07-23 NOTE — Progress Notes (Signed)
Subjective:    Patient ID: Sarah Arnold, female    DOB: 1980-12-09, 35 y.o.   MRN: [id number]  HPI 35 yo never smoker, referred for evaluation for chronic rhinitis and cough. This became more problematic about 4-5 months ago when she was pregnant with her 6th child. She delivered in 9/15 but has continued to experience progressive yellow mucous production in addition to her chronic rhinitis. She was seen in the ED 3 weeks ago and then referred here.   Autoimmune screen was performed 02/11/13 by Dr Doreene Burke > negative ESR, ANA, ANCA, dsDNA. Her ACE level was elevated at 97. HIV 07/2011 > negative. No known TB exposure.   CT chest 02/13/13 > severe bronchiectasis, peribronchovascular GGI and micronodular disease. No honeycombing.   ROV 04/10/13 -- follows for cough and severe bronchiectasis and micronodular disease. She is still coughing, produces sputum. No SOB. She does have nasal congestion.   ROV 05/16/13 -- follows for follows for cough and severe bronchiectasis and micronodular disease. Her FOB showed non-necrotizing granulomas, cx's negative so far but her prior AFB cx have now grown out [facility].   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 We started clarithro + ethambutol + rifampin. She has tolerated well, but the cost is high and is a concern. She is looking at different pharmacies to see if she can get cheaper. She is coughing, bringing up light yellow. Feels the same.  ROV 10/15/13 -- follow up for [facility] and associated bronchiectasis. Tolerating 3 drug regimen. She has been on for 4 months. No side effects noted. Cough is better.   ROV 05/20/14 -- follow up visit for bronchiectasis, MAIC positive in sputum.  She has been on ethambutol, rifampin and clarithro. She has been given dx of sarcoidosis, although this is not definitive based on her FOB and bx's > her granulomas may be due to [facility]. She does have an elevated ACE level in the past. She notes 2 raised nodules on her L index finger, suspicious for sarcoidosis. She reports some rare cough - much better than last year. No fevers, no chills.   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 She's been treated with ethambutol plus rifampin plus clarithromycin for one year. At her last visit we stopped her Antibiotics and planned for reimaging. CT chest was performed on 05/27/14 he reviewed the images myself. There is little change compared with February of this year. There is bronchial wall thickening and cylindrical bronchiectasis particularly in the right lower lobe with some associated groundglass and micro-nodularity. There has been interval improvement compared with January 2015. She reports feeling well, no real change since stopping abx, still has some cough and scant mucous. She isn't sure she can cough enough to give a sample. No bx done on her L index finger yet.   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 She was treated for one year for her [facility]. Her last CT scan of the chest was 05/27/14 as detailed above. We have discussed possibly doing a skin biopsy on her left index finger - this was done in [employer], [place] but we do  not have the results here. She states that her breathing has been doing well. She does have occasional cough, has been more bothersome this allergy season. Non-productive.   ROV 07/23/15 -- follow-up visit for possible sarcoidosis, granulomatous disease on bronchoscopy biopsy. She also is colonized with [facility]. We treated her for one year with triple antibiotics as detailed  above. Sputum for AFB was collected on 06/09/15 she is here to review the results. AFB smear is negative, cultures pending. CT scan of the chest was repeated on 06/12/15 that I have personally reviewed. Very similar to previous film with somewhat vague groundglass attenuation and centrilobular nodularity more consistent with Mycobacterium than sarcoid. No breathing difficulty. She is using some zantac for her congestion. No significant cough.    Review of Systems  Constitutional: Negative for fever and unexpected weight change.  HENT: Positive for congestion and postnasal drip. Negative for dental problem, ear pain, nosebleeds, rhinorrhea, sinus pressure, sneezing, sore throat and trouble swallowing.   Eyes: Negative for redness and itching.  Respiratory: Negative for cough, chest tightness, shortness of breath and wheezing.   Cardiovascular: Negative for palpitations and leg swelling.  Gastrointestinal: Negative for nausea and vomiting.  Genitourinary: Negative for dysuria.  Musculoskeletal: Negative for joint swelling.  Skin: Negative for rash.  Neurological: Negative for headaches.  Psychiatric/Behavioral: Negative for dysphoric mood. The patient is not nervous/anxious.        Objective:   Physical Exam Filed Vitals:   07/23/15 0951  BP: 110/86  Pulse: 78  Height: _0  (1.626 m)  Weight: 196 lb (88.905 kg)  SpO2: 99%   Gen: Pleasant, well-nourished, in no distress,  normal affect  ENT: No lesions,  mouth clear,  oropharynx clear, no postnasal drip  Neck: No JVD, no TMG, no carotid bruits  Lungs: No use of accessory muscles, no dullness to percussion, clear without rales or rhonchi  Cardiovascular: RRR, heart sounds normal, no murmur or gallops, no peripheral edema  Musculoskeletal: No deformities, no cyanosis or clubbing  Neuro: alert, non focal  Skin: Warm, no lesions or rashes  06/12/15 --  COMPARISON: Chest CT 05/27/2014 and multiple  priors.  FINDINGS: Mediastinum/Lymph Nodes: Heart size is normal. There is no significant pericardial fluid, thickening or pericardial calcification. No pathologically enlarged mediastinal or hilar lymph nodes. Please note that accurate exclusion of hilar adenopathy is limited on noncontrast CT scans. There do appear to be multiple borderline enlarged mediastinal and hilar lymph nodes. Esophagus is unremarkable in appearance. No axillary lymphadenopathy.  Lungs/Pleura: High-resolution images demonstrate scattered areas of bronchial wall thickening with cylindrical and varicose bronchiectasis, most severe in the right lower lobe. In the areas of greatest involvement there is extensive architectural distortion and chronic scarring. There is also a generalized thickening of the peribronchovascular interstitium and some thickening of the fissures, with associated nodularity (i.e., there is a perilymphatic distribution of nodularity throughout the lungs bilaterally), compatible with the reported clinical history of sarcoidosis. There is also widespread centrilobular ground-glass attenuation micro nodularity. These findings are very similar to prior study from 05/27/2014, although the extensive ground-glass throughout the lungs bilaterally appears slightly improved. Inspiratory and expiratory imaging is unremarkable. No new acute consolidative airspace disease. No pleural effusions.  Upper abdomen: Unremarkable.  Musculoskeletal: There are no aggressive appearing lytic or blastic lesions noted in the visualized portions of the skeleton.  IMPRESSION: 1. The overall appearance the chest is very similar to prior studies, as detailed above, with a spectrum of findings in the lungs that is  compatible with underlying sarcoidosis. The widespread ground-glass attenuation centrilobular micronodularity is somewhat unusual in the setting of sarcoidosis, raising the possibility  of superimposed chronic atypical infections such is MAI (mycobacterium avium intracellulare).     Assessment & Plan:  Sarcoidosis Your CT scan of the chest is stable compared with your prior study. No new findings. There are still areas of inflammation present.  Please continue zyrtec daily Try starting fluticasone nasal spray, 2 sprays each nostril once a day.  Follow with Dr Lamonte Sakai in 6 months or sooner if you have any problems  MAIC (mycobacterium avium-intracellulare complex) Your sputum culture is negative so far. We will follow it until finalized.  We do not need to start any antibiotics at this time  Allergic rhinitis Try adding fluticasone nasal spray to zyrtec once a day  Baltazar Apo, MD, PhD 07/23/2015, 10:22 AM [facility] [phone] or if no answer [phone]

## 2015-07-23 NOTE — Assessment & Plan Note (Signed)
Your CT scan of the chest is stable compared with your prior study. No new findings. There are still areas of inflammation present.  Please continue zyrtec daily Try starting fluticasone nasal spray, 2 sprays each nostril once a day.  Follow with Dr Delton CoombesByrum in 6 months or sooner if you have any problems

## 2015-07-24 LAB — AFB CULTURE WITH SMEAR (NOT AT ARMC)

## 2016-07-05 ENCOUNTER — Encounter (HOSPITAL_COMMUNITY): Payer: Self-pay | Admitting: *Deleted

## 2018-03-09 LAB — GLUCOSE, POCT (MANUAL RESULT ENTRY): POC Glucose: 160 mg/dl — AB (ref 70–99)

## 2019-05-02 ENCOUNTER — Ambulatory Visit: Payer: No Typology Code available for payment source | Attending: Internal Medicine

## 2019-05-02 DIAGNOSIS — Z23 Encounter for immunization: Secondary | ICD-10-CM

## 2019-05-02 NOTE — Progress Notes (Signed)
   Covid-19 Vaccination Clinic  Name:  Sarah Arnold    MRN: [id number] DOB: [birth year]  05/02/2019  [name] was observed post Covid-19 immunization for 15 minutes without incident. She was provided with Vaccine Information Sheet and instruction to access the V-Safe system.   [name] was instructed to call 911 with any severe reactions post vaccine: Marland Kitchen Difficulty breathing  . Swelling of face and throat  . A fast heartbeat  . A bad rash all over body  . Dizziness and weakness   Immunizations Administered    Name Date Dose VIS Date Route   Pfizer COVID-19 Vaccine 05/02/2019 11:51 AM 0.3 mL 01/04/2019 Intramuscular   Manufacturer: [employer], [employer]   Lot: [id number]   NDC: [address]-0

## 2019-05-27 ENCOUNTER — Ambulatory Visit: Payer: No Typology Code available for payment source | Attending: Internal Medicine

## 2021-07-18 ENCOUNTER — Encounter (HOSPITAL_COMMUNITY): Payer: Self-pay | Admitting: Emergency Medicine

## 2021-07-18 ENCOUNTER — Ambulatory Visit (HOSPITAL_COMMUNITY)
Admission: EM | Admit: 2021-07-18 | Discharge: 2021-07-18 | Disposition: A | Discharge status: Home / Self Care | Payer: No Typology Code available for payment source | Attending: Emergency Medicine | Admitting: Emergency Medicine

## 2021-07-18 DIAGNOSIS — K0889 Other specified disorders of teeth and supporting structures: Secondary | ICD-10-CM

## 2021-07-18 MED ORDER — KETOROLAC TROMETHAMINE 30 MG/ML IJ SOLN
30.0000 mg | Freq: Once | INTRAMUSCULAR | Status: AC
  Administered 2021-07-18: 30 mg via INTRAMUSCULAR

## 2021-07-18 MED ORDER — AMOXICILLIN-POT CLAVULANATE 875-125 MG PO TABS: 1.0000 | ORAL_TABLET | Freq: Two times a day (BID) | ORAL | 0 refills | Status: DC

## 2021-07-18 MED ORDER — KETOROLAC TROMETHAMINE 30 MG/ML IJ SOLN
INTRAMUSCULAR | Status: AC
  Filled 2021-07-18: qty 1

## 2021-07-18 MED ORDER — LIDOCAINE VISCOUS HCL 2 % MT SOLN: 15.0000 mL | OROMUCOSAL | 0 refills | Status: DC | PRN

## 2021-07-18 MED ORDER — TRAMADOL HCL 50 MG PO TABS: 50.0000 mg | ORAL_TABLET | Freq: Four times a day (QID) | ORAL | 0 refills | Status: DC | PRN

## 2021-07-18 NOTE — ED Provider Notes (Signed)
[facility]    CSN: [id number] Arrival date & time: 07/18/21  1627      History   Chief Complaint Chief Complaint  Patient presents with   Dental Pain    HPI Sarah Arnold is a 41 y.o. female.   Patient presents with fever, dental pain and left facial swelling for 2 days.  Offending tooth is in a lot of the left lower gumline and has a hole inside.  Has been unable to tolerate food and has had minimal fluid intake since symptoms began.  Has attempted use of salt water gargles and ibuprofen which have been ineffective.  Is established a local dentist office but has not notified provider.  Denies drainage.  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   Gastritis 05/14/2013   Allergic rhinitis 04/10/2013   Bronchiectasis without acute exacerbation (HCC) 03/14/2013   Cough 02/11/2013   Shortness of breath 02/11/2013   LSIL (low grade squamous intraepithelial lesion) on Pap smear 01/22/2013    Past Surgical History:  Procedure Laterality Date   NO PAST SURGERIES     VIDEO BRONCHOSCOPY Bilateral 04/17/2013   Procedure: VIDEO BRONCHOSCOPY WITHOUT FLUORO;  Surgeon: Leslye Peer, MD;  Location: [facility];  Service: Cardiopulmonary;  Laterality: Bilateral;    OB History     Gravida  6   Para  6   Term  6   Preterm      AB      Living  6      SAB      IAB      Ectopic      Multiple      Live Births  6            Home Medications    Prior to Admission medications   Medication Sig Start Date End Date Taking? Authorizing Provider  fluticasone (FLONASE) 50 MCG/ACT nasal spray Place 2 sprays into both nostrils daily. 07/23/15   Leslye Peer, MD    Family History Family History  Problem Relation Age of Onset   Arthritis Neg Hx    Alcohol abuse Neg Hx    Birth defects Neg Hx    Cancer Neg Hx    COPD Neg Hx    Depression Neg Hx    Diabetes Neg Hx    Drug abuse Neg Hx    Early death Neg Hx    Hearing loss Neg Hx    Heart disease Neg Hx    Hyperlipidemia Neg Hx    Hypertension Neg Hx    Kidney disease Neg Hx    Learning disabilities Neg Hx    Mental illness Neg Hx    Mental retardation Neg Hx    Miscarriages / Stillbirths Neg Hx    Stroke Neg Hx    Vision loss Neg Hx    Asthma Son     Social History Social History   Tobacco Use   Smoking status: Never   Smokeless tobacco: Never  Substance Use Topics   Alcohol use: No   Drug use: No     Allergies   Patient has no known allergies.   Review of Systems  Review of Systems  Constitutional: Negative.   HENT:  Positive for dental problem. Negative for congestion, drooling, ear discharge, ear pain, facial swelling, hearing loss, mouth sores, nosebleeds, postnasal drip, rhinorrhea, sinus pressure, sinus pain, sneezing, sore throat, tinnitus, trouble swallowing and voice change.   Respiratory: Negative.    Cardiovascular: Negative.   Skin: Negative.   Neurological: Negative.      Physical Exam Triage Vital Signs ED Triage Vitals  Enc Vitals Group     BP 07/18/21 1640 (!) 160/96     Pulse Rate 07/18/21 1640 92     Resp 07/18/21 1640 19     Temp 07/18/21 1640 98.3 F (36.8 C)     Temp src --      SpO2 07/18/21 1640 96 %     Weight --      Height --      Head Circumference --      Peak Flow --      Pain Score 07/18/21 1638 9     Pain Loc --      Pain Edu? --      Excl. in GC? --    No data found.  Updated Vital Signs BP (!) 160/96   Pulse 92   Temp 98.3 F (36.8 C)   Resp 19   SpO2 96%   Visual Acuity Right Eye Distance:   Left Eye Distance:   Bilateral Distance:    Right Eye Near:   Left Eye Near:    Bilateral Near:      Physical Exam Constitutional:      Appearance: Normal appearance.  HENT:     Head: Normocephalic.     Mouth/Throat:     Comments: There is a tooth along the left lower gumline with a is partially missing with dental decay present, mild to moderate swelling noted along the left lower gumline with mild cheek swelling Eyes:     Extraocular Movements: Extraocular movements intact.  Pulmonary:     Effort: Pulmonary effort is normal.  Skin:    General: Skin is warm and dry.  Neurological:     Mental Status: She is alert and oriented to person, place, and time. Mental status is at baseline.  Psychiatric:        Mood and Affect: Mood normal.        Behavior: Behavior normal.      UC Treatments / Results  Labs (all labs ordered are listed, but only abnormal results are displayed) Labs Reviewed - No data to display  EKG   Radiology No results found.  Procedures Procedures (including critical care time)  Medications Ordered in UC Medications - No data to display  Initial Impression / Assessment and Plan / UC Course  I have reviewed the triage vital signs and the nursing notes.  Pertinent labs & imaging results that were available during my care of the patient were reviewed by me and considered in my medical decision making (see chart for details).  Dental pain  Vital signs are stable and patient is visibly in pain in the exam room, Toradol injection given, declined Vicodin in office, will cover for infection, Augmentin 7-day course prescribed, prescribed tramadol and viscous lidocaine for additional supportive care, advised patient to notify her dentist to schedule appointment for reevaluation and further management tomorrow, may follow-up with his urgent care as needed Final Clinical Impressions(s) / UC Diagnoses   Final diagnoses:  None   Discharge Instructions   None    ED  Prescriptions   None    PDMP not reviewed this encounter.   Valinda Hoar,  NP 07/18/21 1733

## 2022-01-31 ENCOUNTER — Ambulatory Visit: Payer: Self-pay | Attending: Internal Medicine | Admitting: Internal Medicine

## 2022-01-31 ENCOUNTER — Other Ambulatory Visit: Payer: Self-pay

## 2022-01-31 ENCOUNTER — Encounter: Payer: Self-pay | Admitting: Internal Medicine

## 2022-01-31 VITALS — BP 160/100 | HR 85 | Temp 98.1°F | Ht 64.0 in | Wt 209.0 lb

## 2022-01-31 DIAGNOSIS — R030 Elevated blood-pressure reading, without diagnosis of hypertension: Secondary | ICD-10-CM

## 2022-01-31 DIAGNOSIS — Z23 Encounter for immunization: Secondary | ICD-10-CM

## 2022-01-31 DIAGNOSIS — Z3009 Encounter for other general counseling and advice on contraception: Secondary | ICD-10-CM

## 2022-01-31 DIAGNOSIS — Z0000 Encounter for general adult medical examination without abnormal findings: Secondary | ICD-10-CM

## 2022-01-31 DIAGNOSIS — Z862 Personal history of diseases of the blood and blood-forming organs and certain disorders involving the immune mechanism: Secondary | ICD-10-CM

## 2022-01-31 DIAGNOSIS — Z6835 Body mass index (BMI) 35.0-35.9, adult: Secondary | ICD-10-CM | POA: Insufficient documentation

## 2022-01-31 DIAGNOSIS — Z7689 Persons encountering health services in other specified circumstances: Secondary | ICD-10-CM

## 2022-01-31 MED ORDER — TETANUS-DIPHTH-ACELL PERTUSSIS 5-2-15.5 LF-MCG/0.5 IM SUSP: 0.5000 mL | Freq: Once | INTRAMUSCULAR | 0 refills | Status: AC

## 2022-01-31 NOTE — Patient Instructions (Signed)

## 2022-01-31 NOTE — Progress Notes (Signed)
Patient ID: Sarah Arnold, female    DOB: August 15, 1980  MRN: [id number]  CC: Establish Care (Est care / New pt. //Yes to flu vax. )   Subjective: Sarah Arnold is a 42 y.o. female who presents for new pt visit and physical.  Declines pap today with physical.   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 Lamonte Sakai), sarcoid, LSIL on pap   BP elev today.  No issues with BP in past Reports she is a little stress today. Uses a lot of salt in foods at times  HM:  agrees to get flu and Tdapt.  Had only one COVID vaccine.  Due for pap, would like to get on next visit.     Patient Active Problem List   Diagnosis Date Noted   Healthcare maintenance 02/12/2015   Multiple skin nodules 05/15/2014   Sarcoidosis 05/15/2014   MAIC (mycobacterium avium-intracellulare complex) ([place]) 05/16/2013   Gastritis 05/14/2013   Allergic rhinitis 04/10/2013   Bronchiectasis without acute exacerbation ([place]) 03/14/2013   Cough 02/11/2013   Shortness of breath 02/11/2013   LSIL (low grade squamous intraepithelial lesion) on Pap smear 01/22/2013     Current Outpatient Medications on File Prior to Visit  Medication Sig Dispense Refill   fluticasone (FLONASE) 50 MCG/ACT nasal spray Place 2 sprays into both nostrils daily. 16 g 2   lidocaine (XYLOCAINE) 2 % solution Use as directed 15 mLs in the mouth or throat every 4 (four) hours as needed for mouth pain. 100 mL 0   traMADol (ULTRAM) 50 MG tablet Take 1 tablet (50 mg total) by mouth every 6 (six) hours as needed. 15 tablet 0   No current facility-administered medications on file prior to visit.    No Known Allergies  Social History   Socioeconomic History   Marital status: Married    Spouse name: Not on file   Number of children: 6   Years of education: Not on file   Highest education level: Not on file  Occupational History   Occupation: unemployed  Tobacco Use   Smoking status:  Never   Smokeless tobacco: Never  Vaping Use   Vaping Use: Never used  Substance and Sexual Activity   Alcohol use: No   Drug use: No   Sexual activity: Not Currently    Birth control/protection: Implant  Other Topics Concern   Not on file  Social History Narrative   Immigrated from [place] in 2001   No known TB exposures   Lives now in [place] in [place] Strain: Not on file  Food Insecurity: Not on file  Transportation Needs: Not on file  Physical Activity: Not on file  Stress: Not on file  Social Connections: Not on file  Intimate Partner Violence: Not on file    Family History  Problem Relation Age of Onset   Asthma Son    Arthritis Neg Hx    Alcohol abuse Neg Hx    Birth defects Neg Hx    Cancer Neg Hx    COPD Neg Hx    Depression Neg Hx    Diabetes Neg Hx    Drug abuse Neg Hx    Early death Neg Hx    Hearing loss Neg Hx    Heart disease Neg Hx    Hyperlipidemia Neg Hx    Hypertension Neg Hx    Kidney  disease Neg Hx    Learning disabilities Neg Hx    Mental illness Neg Hx    Mental retardation Neg Hx    Miscarriages / Stillbirths Neg Hx    Stroke Neg Hx    Vision loss Neg Hx     Past Surgical History:  Procedure Laterality Date   NO PAST SURGERIES     VIDEO BRONCHOSCOPY Bilateral 04/17/2013   Procedure: VIDEO BRONCHOSCOPY WITHOUT FLUORO;  Surgeon: Collene Gobble, MD;  Location: [facility];  Service: Cardiopulmonary;  Laterality: Bilateral;    ROS: Review of Systems  Constitutional:        Not exercising much.  Eats a lot of white carbs  HENT:  Negative for congestion, hearing loss and sore throat.        Gets routine dental care Q 6 mths  Eyes:  Negative for visual disturbance.       Has not had a formal eye exam in years  Respiratory:  Negative for cough and chest tightness.        Hx of Sarcoid and MAC in 2015.  No cough, SOB at this time.  Breathing has been good.  Cardiovascular:  Negative for  chest pain.  Gastrointestinal:  Negative for blood in stool and diarrhea.  Genitourinary:        Has Nexplanon through HD x 4 yrs.  Would like to have removed.  Does not have menstrual cycle since Nexplanon.  Neurological:  Negative for dizziness.  Psychiatric/Behavioral:  Negative for decreased concentration.    Negative except as stated above  PHYSICAL EXAM: BP (!) 160/100   Pulse 85   Temp 98.1 F (36.7 C) (Oral)   Ht _0  (1.626 m)   Wt 209 lb (94.8 kg)   SpO2 99%   BMI 35.87 kg/m   Physical Exam  General appearance - alert, well appearing, and in no distress Mental status - normal mood, behavior, speech, dress, motor activity, and thought processes Eyes - pupils equal and reactive, extraocular eye movements intact Ears - bilateral TM's and external ear canals normal Nose - normal and patent, no erythema, discharge or polyps Mouth - mucous membranes moist, pharynx normal without lesions Neck - supple, no significant adenopathy Lymphatics - no palpable lymphadenopathy, no hepatosplenomegaly Chest - clear to auscultation, no wheezes, rales or rhonchi, symmetric air entry Heart - normal rate, regular rhythm, normal S1, S2, no murmurs, rubs, clicks or gallops Abdomen - soft, nontender, nondistended, no masses or organomegaly Breasts - deferred Pelvic - deferred until next visit per pt's request Neurological - cranial nerves II through XII intact, motor and sensory grossly normal bilaterally Musculoskeletal - no joint tenderness, deformity or swelling Extremities - peripheral pulses normal, no pedal edema, no clubbing or cyanosis    01/31/2022   10:00 AM 02/12/2015   10:07 AM 05/15/2014   10:03 AM  Depression screen PHQ 2/9  Decreased Interest 3 0 0  Down, Depressed, Hopeless 0 0 0  PHQ - 2 Score 3 0 0  Altered sleeping 0    Tired, decreased energy 0    Change in appetite 0    Feeling bad or failure about yourself  0    Trouble concentrating 0    Moving slowly or  fidgety/restless 0    Suicidal thoughts 0    PHQ-9 Score 3          Latest Ref Rng & Units 02/12/2015   11:05 AM 05/15/2014   10:37 AM 05/08/2013  9:35 PM  CMP  Glucose 65 - 99 mg/dL 87  89  135   BUN 7 - 25 mg/dL _0 Creatinine 0.50 - 1.10 mg/dL 0.97  1.03  0.98   Sodium 135 - 146 mmol/L 136  141  138   Potassium 3.5 - 5.3 mmol/L 4.3  4.9  3.6   Chloride 98 - 110 mmol/L 100  105  99   CO2 20 - 31 mmol/L _1 Calcium 8.6 - 10.2 mg/dL 9.7  9.4  9.6   Total Protein 6.1 - 8.1 g/dL 7.5  6.9  8.3   Total Bilirubin 0.2 - 1.2 mg/dL 0.3  0.2  0.2   Alkaline Phos 33 - 115 U/L 80  75  103   AST 10 - 30 U/L _2 ALT 6 - 29 U/L _3 Lipid Panel     Component Value Date/Time   CHOL 150 05/15/2014 1037   TRIG 83 05/15/2014 1037   HDL 51 05/15/2014 1037   CHOLHDL 2.9 05/15/2014 1037   VLDL 17 05/15/2014 1037   LDLCALC 82 05/15/2014 1037    CBC    Component Value Date/Time   WBC 7.3 02/12/2015 1105   RBC 5.16 (H) 02/12/2015 1105   HGB 12.3 02/12/2015 1105   HCT 39.1 02/12/2015 1105   PLT 341 02/12/2015 1105   MCV 75.8 (L) 02/12/2015 1105   MCH 23.8 (L) 02/12/2015 1105   MCHC 31.5 02/12/2015 1105   RDW 15.6 (H) 02/12/2015 1105   LYMPHSABS 2.3 02/12/2015 1105   MONOABS 0.8 02/12/2015 1105   EOSABS 0.7 02/12/2015 1105   BASOSABS 0.1 02/12/2015 1105    ASSESSMENT AND PLAN:  1. Establishing care with new doctor, encounter for   2. Annual physical exam   3. Class 2 severe obesity with serious comorbidity and body mass index (BMI) of 35.0 to 35.9 in adult, unspecified obesity type [facility]) Patient advised to eliminate sugary drinks from the diet, cut back on portion sizes especially of white carbohydrates, eat more white lean meat like chicken [place] and seafood instead of beef or pork and incorporate fresh fruits and vegetables into the diet daily. Encouraged her to get in some form of moderate intensity exercise at least 5 days a week for 30  minutes.  Went over ways she can incorporate exercise into her daily routine. - CBC - Comprehensive metabolic panel - Lipid panel  4. Elevated blood pressure reading in office without diagnosis of hypertension .  DASH diet discussed on encourage.  We will bring her back to see the clinical pharmacist in 2 weeks for repeat blood pressure check.  If still elevated she will need to be started on antihypertensive  5. Family planning Advised patient that the Nexplanon usually last about 3 years.  She should follow-up with family-planning clinic at [facility] to have her current one removed and replaced if she desires to have it replaced.  6. History of sarcoidosis Stable without any symptoms or skin manifestations at this time  7. Need for Tdap vaccination Given prescription to take to our pharmacy downstairs to get this through the patient assistance program - Tdap (ADACEL) 05-25-13.5 LF-MCG/0.5 injection; Inject 0.5 mLs into the muscle once for 1 dose.  Dispense: 0.5 mL; Refill: 0  8. Need for immunization against influenza Given today. - Flu Vaccine QUAD 31moIM (Fluarix, Fluzone & Alfiuria [employer]  PF)    Patient was given the opportunity to ask questions.  Patient verbalized understanding of the plan and was able to repeat key elements of the plan.   This documentation was completed using Radio producer.  Any transcriptional errors are unintentional.  Orders Placed This Encounter  Procedures   Flu Vaccine QUAD 73moIM (Fluarix, Fluzone & Alfiuria Quad PF)   CBC   Comprehensive metabolic panel   Lipid panel     Requested Prescriptions   Signed Prescriptions Disp Refills   Tdap (ADACEL) 05-25-13.5 LF-MCG/0.5 injection 0.5 mL 0    Sig: Inject 0.5 mLs into the muscle once for 1 dose.    Return in about 1 month (around 03/03/2022), or with me in 1 mth for PAP, [facility] BP [facility].  DKarle Plumber MD, FACP

## 2022-02-01 ENCOUNTER — Other Ambulatory Visit: Payer: Self-pay

## 2022-02-01 LAB — COMPREHENSIVE METABOLIC PANEL
ALT: 13 IU/L (ref 0–32)
AST: 11 IU/L (ref 0–40)
Albumin/Globulin Ratio: 1.3 (ref 1.2–2.2)
Albumin: 4.1 g/dL (ref 3.9–4.9)
Alkaline Phosphatase: 102 IU/L (ref 44–121)
BUN/Creatinine Ratio: 12 (ref 9–23)
BUN: 11 mg/dL (ref 6–24)
Bilirubin Total: <0.2 mg/dL (ref 0.0–1.2)
CO2: 23 mmol/L (ref 20–29)
Calcium: 9.4 mg/dL (ref 8.7–10.2)
Chloride: 101 mmol/L (ref 96–106)
Creatinine, Ser: 0.9 mg/dL (ref 0.57–1.00)
Globulin, Total: 3.1 g/dL (ref 1.5–4.5)
Glucose: 105 mg/dL — ABNORMAL HIGH (ref 70–99)
Potassium: 4 mmol/L (ref 3.5–5.2)
Sodium: 140 mmol/L (ref 134–144)
Total Protein: 7.2 g/dL (ref 6.0–8.5)
eGFR: 82 mL/min/{1.73_m2} (ref >59)

## 2022-02-01 LAB — LIPID PANEL
Chol/HDL Ratio: 2.8 ratio (ref 0.0–4.4)
Cholesterol, Total: 152 mg/dL (ref 100–199)
HDL: 54 mg/dL (ref >39)
LDL Chol Calc (NIH): 85 mg/dL (ref 0–99)
Triglycerides: 68 mg/dL (ref 0–149)
VLDL Cholesterol Cal: 13 mg/dL (ref 5–40)

## 2022-02-01 LAB — CBC
Hematocrit: 37.1 % (ref 34.0–46.6)
Hemoglobin: 11.4 g/dL (ref 11.1–15.9)
MCH: 23.2 pg — ABNORMAL LOW (ref 26.6–33.0)
MCHC: 30.7 g/dL — ABNORMAL LOW (ref 31.5–35.7)
MCV: 75 fL — ABNORMAL LOW (ref 79–97)
Platelets: 381 10*3/uL (ref 150–450)
RBC: 4.92 x10E6/uL (ref 3.77–5.28)
RDW: 14.8 % (ref 11.7–15.4)
WBC: 11.9 10*3/uL — ABNORMAL HIGH (ref 3.4–10.8)

## 2022-02-04 ENCOUNTER — Other Ambulatory Visit: Payer: Self-pay

## 2022-02-08 ENCOUNTER — Other Ambulatory Visit: Payer: Self-pay

## 2022-02-11 ENCOUNTER — Other Ambulatory Visit: Payer: Self-pay

## 2022-02-14 ENCOUNTER — Other Ambulatory Visit: Payer: Self-pay

## 2022-02-14 ENCOUNTER — Ambulatory Visit: Payer: Self-pay | Attending: Internal Medicine | Admitting: Pharmacist

## 2022-02-14 VITALS — BP 160/102 | HR 87

## 2022-02-14 DIAGNOSIS — I10 Essential (primary) hypertension: Secondary | ICD-10-CM

## 2022-02-14 MED ORDER — AMLODIPINE BESYLATE 5 MG PO TABS
5.0000 mg | ORAL_TABLET | Freq: Every day | ORAL | 2 refills | Status: DC
  Filled 2022-02-14: qty 30, 30d supply, fill #0

## 2022-02-14 NOTE — Progress Notes (Signed)
   S:    PCP: Dr. Wynetta Emery No chief complaint on file.  42 y.o. female who presents for hypertension evaluation, education, and management.  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 Lamonte Sakai), and sarcoid.  Patient was referred and last seen by Primary Care Provider, Dr. Wynetta Emery, on 01/31/2022.   At last visit, BP was elevated at 160/100 mmHg and patient did not have a documented diagnosis of hypertension.   Today, patient arrives in good spirits and presents without assistance. Denies dizziness, headache, blurred vision, swelling. BP in clinic elevated at 160/100 mmHg x2.   Patient denies she has ever been diagnosed with hypertension.  Family/Social history:  HTN: father MI/Stroke: none Tobacco/alcohol use: never Caffeine: none  Patient not on any BP medications.   Reported home BP readings: SBP 163, unable to provide DBP. Endorses all readings are >120.   Patient-reported exercise habits: jump rope   O:  Vitals:   02/14/22 0948 02/14/22 0955  BP: (!) 162/109 (!) 160/102  Pulse: 87     Last 3 Office BP readings: BP Readings from Last 3 Encounters:  02/14/22 (!) 160/102  01/31/22 (!) 160/100  07/18/21 (!) 160/96    BMET    Component Value Date/Time   NA 140 01/31/2022 1002   K 4.0 01/31/2022 1002   CL 101 01/31/2022 1002   CO2 23 01/31/2022 1002   GLUCOSE 105 (H) 01/31/2022 1002   GLUCOSE 87 02/12/2015 1105   BUN 11 01/31/2022 1002   CREATININE 0.90 01/31/2022 1002   CREATININE 0.97 02/12/2015 1105   CALCIUM 9.4 01/31/2022 1002   GFRNONAA 76 02/12/2015 1105   GFRAA 88 02/12/2015 1105    Renal function: CrCl cannot be calculated (Unknown ideal weight.).  Clinical ASCVD: No  The 10-year ASCVD risk score (Arnett DK, et al., 2019) is: 0.8%   Values used to calculate the score:     Age: 28 years     Sex: Female     Is Non-Hispanic African American: No     Diabetic: No     Tobacco smoker: No     Systolic  Blood Pressure: 160 mmHg     Is BP treated: Yes     HDL Cholesterol: 54 mg/dL     Total Cholesterol: 152 mg/dL   A/P: Hypertension, not previously diagnosed, now with documented elevated blood pressure reading at 2 separate office visits. Currently above goal not on any medications. BP goal < 130/80 mmHg.  -Started amlodipine 5 mg daily.  -Patient educated on purpose, proper use, and potential adverse effects of amlodipine.  -Counseled on lifestyle modifications for blood pressure control including reduced dietary sodium, increased exercise, adequate sleep. -Encouraged patient to check BP at home and bring log of readings to next visit. Counseled on proper use of home BP cuff.   Results reviewed and written information provided.    Written patient instructions provided. Patient verbalized understanding of treatment plan.  Total time in face to face counseling 25 minutes.    Follow-up:  Pharmacist PRN. PCP clinic visit on 03/03/2022.   Joseph Art, Pharm.D. PGY-2 Ambulatory Care Pharmacy Resident 02/14/2022 10:01 AM

## 2022-02-15 ENCOUNTER — Other Ambulatory Visit: Payer: Self-pay

## 2022-02-17 ENCOUNTER — Other Ambulatory Visit: Payer: Self-pay

## 2022-02-18 ENCOUNTER — Other Ambulatory Visit: Payer: Self-pay

## 2022-02-21 ENCOUNTER — Other Ambulatory Visit: Payer: Self-pay

## 2022-02-25 ENCOUNTER — Other Ambulatory Visit: Payer: Self-pay

## 2022-03-03 ENCOUNTER — Encounter: Payer: Self-pay | Admitting: Internal Medicine

## 2022-03-03 ENCOUNTER — Other Ambulatory Visit (HOSPITAL_COMMUNITY)
Admission: RE | Admit: 2022-03-03 | Discharge: 2022-03-03 | Disposition: A | Discharge status: Home / Self Care | Payer: Self-pay | Source: Ambulatory Visit | Attending: Internal Medicine | Admitting: Internal Medicine

## 2022-03-03 ENCOUNTER — Ambulatory Visit: Payer: Self-pay | Attending: Internal Medicine | Admitting: Internal Medicine

## 2022-03-03 ENCOUNTER — Ambulatory Visit: Payer: Self-pay

## 2022-03-03 VITALS — BP 143/95 | HR 90 | Ht 64.0 in

## 2022-03-03 DIAGNOSIS — Z124 Encounter for screening for malignant neoplasm of cervix: Secondary | ICD-10-CM

## 2022-03-03 DIAGNOSIS — Z1231 Encounter for screening mammogram for malignant neoplasm of breast: Secondary | ICD-10-CM | POA: Insufficient documentation

## 2022-03-03 DIAGNOSIS — I10 Essential (primary) hypertension: Secondary | ICD-10-CM

## 2022-03-03 LAB — CYTOLOGY - PAP: Pap: NEGATIVE

## 2022-03-03 NOTE — Progress Notes (Signed)
Patient ID: Sarah Arnold, female    DOB: [birth year]  MRN: [id number]  CC: Hypertension (Htn f/u. Mardene Celeste received flu vax. )   Subjective: Sarah Arnold is a 42 y.o. 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 DASH diet discussed and encouraged.  Saw clinical pharmacist since last visit.  Started on Norvasc 5 mg.  Taking and tolerating the med Has a device but not checking BP  GYN History:  Pt is G6P6 Any hx of abn paps?: yes Menses regular or irregular?: no menses in 9 yrs due to Implenon How long does menses last? NA Menstrual flow light or heavy?:  Method of birth control?:  Implenon, last one placed 5 yrs ago at [facility] Any vaginal dischg at this time?:  no Dysuria?: no Any hx of STI?: no Sexually active with how many partners: husband only Desires STI screen:  no Last MMG:  never had one.  Discussed breast cancer screening recommendations with some organizations recommending starting at age 68 while others at age 68.  Patient prefers to wait until she is in her mid 50s to start. Family hx of uterine, cervical or breast cancer?:  no.     Patient Active Problem List   Diagnosis Date Noted   Class 2 severe obesity with serious comorbidity and body mass index (BMI) of 35.0 to 35.9 in adult [facility]) 01/31/2022   Healthcare maintenance 02/12/2015   Multiple skin nodules 05/15/2014   Sarcoidosis 05/15/2014   MAIC (mycobacterium avium-intracellulare complex) ([place]) 05/16/2013   Gastritis 05/14/2013   Allergic rhinitis 04/10/2013   Cough 02/11/2013   Shortness of breath 02/11/2013   LSIL (low grade squamous intraepithelial lesion) on Pap smear 01/22/2013     Current Outpatient Medications on File Prior to Visit  Medication Sig Dispense Refill   amLODipine (NORVASC) 5 MG tablet Take 1 tablet (5  mg total) by mouth daily. 30 tablet 2   fluticasone (FLONASE) 50 MCG/ACT nasal spray Place 2 sprays into both nostrils daily. (Patient not taking: Reported on 03/03/2022) 16 g 2   lidocaine (XYLOCAINE) 2 % solution Use as directed 15 mLs in the mouth or throat every 4 (four) hours as needed for mouth pain. (Patient not taking: Reported on 03/03/2022) 100 mL 0   traMADol (ULTRAM) 50 MG tablet Take 1 tablet (50 mg total) by mouth every 6 (six) hours as needed. (Patient not taking: Reported on 03/03/2022) 15 tablet 0   No current facility-administered medications on file prior to visit.    No Known Allergies  Social History   Socioeconomic History   Marital status: Married    Spouse name: Not on file   Number of children: 6   Years of education: Not on file   Highest education level: Not on file  Occupational History   Occupation: unemployed  Tobacco Use   Smoking status: Never   Smokeless tobacco: Never  Vaping Use   Vaping Use: Never used  Substance and Sexual Activity   Alcohol use: No   Drug use: No   Sexual activity: Not Currently    Birth control/protection: Implant  Other Topics Concern   Not on file  Social History Narrative   Immigrated from [place] in 2001   No known TB exposures   Lives now  [place]   Social Determinants of Health   Financial Resource Strain: Not on file  Food Insecurity: Not on file  Transportation Needs: Not on file  Physical Activity: Not on file  Stress: Not on file  Social Connections: Not on file  Intimate Partner Violence: Not on file    Family History  Problem Relation Age of Onset   Asthma Son    Arthritis Neg Hx    Alcohol abuse Neg Hx    Birth defects Neg Hx    Cancer Neg Hx    COPD Neg Hx    Depression Neg Hx    Diabetes Neg Hx    Drug abuse Neg Hx    Early death Neg Hx    Hearing loss Neg Hx    Heart disease Neg Hx    Hyperlipidemia Neg Hx    Hypertension Neg Hx    Kidney disease Neg Hx    Learning disabilities Neg Hx     Mental illness Neg Hx    Mental retardation Neg Hx    Miscarriages / Stillbirths Neg Hx    Stroke Neg Hx    Vision loss Neg Hx     Past Surgical History:  Procedure Laterality Date   NO PAST SURGERIES     VIDEO BRONCHOSCOPY Bilateral 04/17/2013   Procedure: VIDEO BRONCHOSCOPY WITHOUT FLUORO;  Surgeon: Collene Gobble, MD;  Location: [facility];  Service: Cardiopulmonary;  Laterality: Bilateral;    ROS: Review of Systems Negative except as stated above  PHYSICAL EXAM: BP (!) 143/95 (BP Location: Left Arm, Patient Position: Sitting, Cuff Size: Normal)   Pulse 90   Ht 5\' 4"  (1.626 m)   SpO2 99%   BMI 35.87 kg/m   Physical Exam Repeat BP 150/100 General appearance - alert, well appearing, and in no distress Chest - clear to auscultation, no wheezes, rales or rhonchi, symmetric air entry Heart - normal rate, regular rhythm, normal S1, S2, no murmurs, rubs, clicks or gallops Breasts - CMA Clarrisa is present for breast/pelvic: breasts appear normal, no suspicious masses, no skin or nipple changes or axillary nodes Pelvic - normal external genitalia, vulva, vagina, cervix, uterus and adnexa      Latest Ref Rng & Units 01/31/2022   10:02 AM 02/12/2015   11:05 AM 05/15/2014   10:37 AM  CMP  Glucose 70 - 99 mg/dL 105  87  89   BUN 6 - 24 mg/dL 11  17  19    Creatinine 0.57 - 1.00 mg/dL 0.90  0.97  1.03   Sodium 134 - 144 mmol/L 140  136  141   Potassium 3.5 - 5.2 mmol/L 4.0  4.3  4.9   Chloride 96 - 106 mmol/L 101  100  105   CO2 20 - 29 mmol/L 23  26  28    Calcium 8.7 - 10.2 mg/dL 9.4  9.7  9.4   Total Protein 6.0 - 8.5 g/dL 7.2  7.5  6.9   Total Bilirubin 0.0 - 1.2 mg/dL <0.2  0.3  0.2   Alkaline Phos 44 - 121 IU/L 102  80  75   AST 0 - 40 IU/L 11  15  12    ALT 0 - 32 IU/L 13  15  10     Lipid Panel     Component Value Date/Time   CHOL 152 01/31/2022 1002   TRIG 68 01/31/2022 1002   HDL 54 01/31/2022 1002   CHOLHDL 2.8 01/31/2022 1002   CHOLHDL  2.9 05/15/2014 1037    VLDL 17 05/15/2014 1037   LDLCALC 85 01/31/2022 1002    CBC    Component Value Date/Time   WBC 11.9 (H) 01/31/2022 1002   WBC 7.3 02/12/2015 1105   RBC 4.92 01/31/2022 1002   RBC 5.16 (H) 02/12/2015 1105   HGB 11.4 01/31/2022 1002   HCT 37.1 01/31/2022 1002   PLT 381 01/31/2022 1002   MCV 75 (L) 01/31/2022 1002   MCH 23.2 (L) 01/31/2022 1002   MCH 23.8 (L) 02/12/2015 1105   MCHC 30.7 (L) 01/31/2022 1002   MCHC 31.5 02/12/2015 1105   RDW 14.8 01/31/2022 1002   LYMPHSABS 2.3 02/12/2015 1105   MONOABS 0.8 02/12/2015 1105   EOSABS 0.7 02/12/2015 1105   BASOSABS 0.1 02/12/2015 1105    ASSESSMENT AND PLAN: 1. Essential hypertension Blood pressure not at goal but she has not taken amlodipine as yet for the morning.  She will take it as soon as she returns home.  Advised to check blood blood pressure at least twice a week and bring readings with her at next visit with the clinical pharmacist in 1 month.  Continue to limit salt in the foods.  2. Pap smear for cervical cancer screening - Cytology - PAP     Patient was given the opportunity to ask questions.  Patient verbalized understanding of the plan and was able to repeat key elements of the plan.   This documentation was completed using Radio producer.  Any transcriptional errors are unintentional.  No orders of the defined types were placed in this encounter.    Requested Prescriptions    No prescriptions requested or ordered in this encounter    Return in about 4 months (around 07/02/2022) for Appt with [facility] in 4 wks for BP check.  Karle Plumber, MD, FACP

## 2022-03-08 LAB — CYTOLOGY - PAP
Comment: NEGATIVE
Diagnosis: NEGATIVE
High risk HPV: NEGATIVE

## 2022-03-25 ENCOUNTER — Other Ambulatory Visit: Payer: Self-pay

## 2022-04-04 ENCOUNTER — Ambulatory Visit: Payer: Self-pay | Attending: Pharmacist | Admitting: Pharmacist

## 2022-06-29 ENCOUNTER — Other Ambulatory Visit: Payer: Self-pay | Admitting: Obstetrics & Gynecology

## 2022-06-29 DIAGNOSIS — Z1231 Encounter for screening mammogram for malignant neoplasm of breast: Secondary | ICD-10-CM

## 2022-07-05 ENCOUNTER — Ambulatory Visit: Payer: BC Managed Care – PPO | Attending: Internal Medicine | Admitting: Internal Medicine

## 2022-07-06 ENCOUNTER — Other Ambulatory Visit: Payer: Self-pay

## 2022-07-06 ENCOUNTER — Encounter: Payer: Self-pay | Admitting: Internal Medicine

## 2022-07-06 ENCOUNTER — Ambulatory Visit: Payer: BC Managed Care – PPO | Attending: Internal Medicine | Admitting: Internal Medicine

## 2022-07-06 VITALS — BP 147/89 | HR 91 | Temp 98.4°F | Ht 64.0 in | Wt 213.0 lb

## 2022-07-06 DIAGNOSIS — I10 Essential (primary) hypertension: Secondary | ICD-10-CM

## 2022-07-06 DIAGNOSIS — Z6836 Body mass index (BMI) 36.0-36.9, adult: Secondary | ICD-10-CM | POA: Diagnosis not present

## 2022-07-06 MED ORDER — AMLODIPINE BESYLATE 5 MG PO TABS
5.0000 mg | ORAL_TABLET | Freq: Every day | ORAL | 2 refills | Status: DC
  Filled 2022-07-06: qty 90, 90d supply, fill #0

## 2022-07-06 NOTE — Progress Notes (Signed)
Patient ID: Sarah Arnold, female    DOB: [birth year]  MRN: [id number]  CC: Hypertension (HTN f/u. Vickey Sages that amlodipine is not lowering BP. Garth Schlatter eating per pt - unable to sleep due to hunger/No to Tdap vax.)   Subjective: Sarah Arnold is a 42 y.o. 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  Norvasc 5 mg  Med Adherence: []  Yes    [x]  No - not taking every day.  Does not feel it helps.  Last picked up 30 day rxn 01/2022 Medication side effects: []  Yes    [x]  No Adherence with salt restriction: [x]  Yes    []  No Home Monitoring?: [x]  Yes    []  No Monitoring Frequency:  Home BP results range:  SOB? []  Yes    []  No Chest Pain?: []  Yes    []  No Leg swelling?: []  Yes    []  No Headaches?: []  Yes    []  No Dizziness? []  Yes    []  No Comments:   Reports problem controlling her appetite.  Wakes up in middle of night wanting to snack.  She would wake up and eat spaghetti. Eats dinner around 6 p.m.  Walks Q 2 days for 1 hr.  Drinks water and milk.  Drinks soda occasion.  Cooks daily.    Patient Active Problem List   Diagnosis Date Noted   Class 2 severe obesity with serious comorbidity and body mass index (BMI) of 35.0 to 35.9 in adult [facility]) 01/31/2022   Healthcare maintenance 02/12/2015   Multiple skin nodules 05/15/2014   Sarcoidosis 05/15/2014   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   Gastritis 05/14/2013   Allergic rhinitis 04/10/2013   Cough 02/11/2013   Shortness of breath 02/11/2013   LSIL (low grade squamous intraepithelial lesion) on Pap smear 01/22/2013     No current outpatient medications on file prior to visit.   No current facility-administered medications on file prior to visit.    No Known Allergies  Social  History   Socioeconomic History   Marital status: Married    Spouse name: Not on file   Number of children: 6   Years of education: Not on file   Highest education level: Not on file  Occupational History   Occupation: unemployed  Tobacco Use   Smoking status: Never   Smokeless tobacco: Never  Vaping Use   Vaping Use: Never used  Substance and Sexual Activity   Alcohol use: No   Drug use: No   Sexual activity: Not Currently    Birth control/protection: Implant  Other Topics Concern   Not on file  Social History Narrative   Immigrated from [place] in 2001   No known TB exposures   Lives now in [place] in [place] [place]   Social Determinants of Health   Financial Resource Strain: Not on file  Food Insecurity: Not on file  Transportation Needs: Not on file  Physical Activity: Not on file  Stress: Not on file  Social Connections: Not on file  Intimate Partner Violence: Not on file    Family History  Problem Relation Age of Onset   Asthma Son    Arthritis Neg Hx    Alcohol abuse Neg Hx    Birth defects Neg Hx    Cancer Neg Hx  COPD Neg Hx    Depression Neg Hx    Diabetes Neg Hx    Drug abuse Neg Hx    Early death Neg Hx    Hearing loss Neg Hx    Heart disease Neg Hx    Hyperlipidemia Neg Hx    Hypertension Neg Hx    Kidney disease Neg Hx    Learning disabilities Neg Hx    Mental illness Neg Hx    Mental retardation Neg Hx    Miscarriages / Stillbirths Neg Hx    Stroke Neg Hx    Vision loss Neg Hx     Past Surgical History:  Procedure Laterality Date   NO PAST SURGERIES     VIDEO BRONCHOSCOPY Bilateral 04/17/2013   Procedure: VIDEO BRONCHOSCOPY WITHOUT FLUORO;  Surgeon: Leslye Peer, MD;  Location: [facility];  Service: Cardiopulmonary;  Laterality: Bilateral;    ROS: Review of Systems Negative except as stated above  PHYSICAL EXAM: BP (!) 147/89 (BP Location: Left Arm, Patient Position: Sitting, Cuff Size: Normal)   Pulse 91   Temp 98.4 F (36.9 C) (Oral)    Ht 5\' 4"  (1.626 m)   Wt 213 lb (96.6 kg)   SpO2 98%   BMI 36.56 kg/m   Physical Exam Repeat BP 150/100 General appearance - alert, well appearing, and in no distress Mental status - normal mood, behavior, speech, dress, motor activity, and thought processes Chest - clear to auscultation, no wheezes, rales or rhonchi, symmetric air entry Heart - normal rate, regular rhythm, normal S1, S2, no murmurs, rubs, clicks or gallops      Latest Ref Rng & Units 01/31/2022   10:02 AM 02/12/2015   11:05 AM 05/15/2014   10:37 AM  CMP  Glucose 70 - 99 mg/dL 782  87  89   BUN 6 - 24 mg/dL 11  17  19    Creatinine 0.57 - 1.00 mg/dL 9.56  2.13  0.86   Sodium 134 - 144 mmol/L 140  136  141   Potassium 3.5 - 5.2 mmol/L 4.0  4.3  4.9   Chloride 96 - 106 mmol/L 101  100  105   CO2 20 - 29 mmol/L 23  26  28    Calcium 8.7 - 10.2 mg/dL 9.4  9.7  9.4   Total Protein 6.0 - 8.5 g/dL 7.2  7.5  6.9   Total Bilirubin 0.0 - 1.2 mg/dL <5.7  0.3  0.2   Alkaline Phos 44 - 121 IU/L 102  80  75   AST 0 - 40 IU/L 11  15  12    ALT 0 - 32 IU/L 13  15  10     Lipid Panel     Component Value Date/Time   CHOL 152 01/31/2022 1002   TRIG 68 01/31/2022 1002   HDL 54 01/31/2022 1002   CHOLHDL 2.8 01/31/2022 1002   CHOLHDL 2.9 05/15/2014 1037   VLDL 17 05/15/2014 1037   LDLCALC 85 01/31/2022 1002    CBC    Component Value Date/Time   WBC 11.9 (H) 01/31/2022 1002   WBC 7.3 02/12/2015 1105   RBC 4.92 01/31/2022 1002   RBC 5.16 (H) 02/12/2015 1105   HGB 11.4 01/31/2022 1002   HCT 37.1 01/31/2022 1002   PLT 381 01/31/2022 1002   MCV 75 (L) 01/31/2022 1002   MCH 23.2 (L) 01/31/2022 1002   MCH 23.8 (L) 02/12/2015 1105   MCHC 30.7 (L) 01/31/2022 1002   MCHC 31.5 02/12/2015 1105  RDW 14.8 01/31/2022 1002   LYMPHSABS 2.3 02/12/2015 1105   MONOABS 0.8 02/12/2015 1105   EOSABS 0.7 02/12/2015 1105   BASOSABS 0.1 02/12/2015 1105    ASSESSMENT AND PLAN:  1. Essential hypertension Not at goal due to nonadherence  with medication.  Discussed the importance of taking the amlodipine consistently every day.  Updated prescription sent to the pharmacy.  DASH diet advised.  Advised to check blood pressure at least twice a week and record the readings.  Will have her follow-up with the clinical pharmacist in 1 month. - amLODipine (NORVASC) 5 MG tablet; Take 1 tablet (5 mg total) by mouth daily.  Dispense: 90 tablet; Refill: 2  2. Class 2 severe obesity with serious comorbidity and body mass index (BMI) of 36.0 to 36.9 in adult, unspecified obesity type [facility]) Discussed on encourage healthy eating habits. Recommend referral to nutritionist for dietary counseling.  Patient declined. We discussed trying her with [facility].  However after discussing how the medication works and potential side effects, patient wanted to hold off.  She states that she will think about it and will let me know if she changes her mind.    Patient was given the opportunity to ask questions.  Patient verbalized understanding of the plan and was able to repeat key elements of the plan.   This documentation was completed using Paediatric nurse.  Any transcriptional errors are unintentional.  No orders of the defined types were placed in this encounter.    Requested Prescriptions   Signed Prescriptions Disp Refills   amLODipine (NORVASC) 5 MG tablet 90 tablet 2    Sig: Take 1 tablet (5 mg total) by mouth daily.    Return in about 3 months (around 10/06/2022) for Appt with [facility] in 4 wks for BP check.  Jonah Blue, MD, FACP

## 2022-07-06 NOTE — Patient Instructions (Signed)
Semaglutide Injection (Weight Management) What is this medication? SEMAGLUTIDE (SEM a GLOO tide) promotes weight loss. It may also be used to maintain weight loss. It works by decreasing appetite. Changes to diet and exercise are often combined with this medication. This medicine may be used for other purposes; ask your health care provider or pharmacist if you have questions. COMMON BRAND NAME(S): Wegovy What should I tell my care team before I take this medication? They need to know if you have any of these conditions: Endocrine tumors (MEN 2) or if someone in your family had these tumors Eye disease, vision problems Gallbladder disease History of depression or mental health disease History of pancreatitis Kidney disease Stomach or intestine problems Suicidal thoughts, plans, or attempt; a previous suicide attempt by you or a family member Thyroid cancer or if someone in your family had thyroid cancer An unusual or allergic reaction to semaglutide, other medications, foods, dyes, or preservatives Pregnant or trying to get pregnant Breast-feeding How should I use this medication? This medication is injected under the skin. You will be taught how to prepare and give it. Take it as directed on the prescription label. It is given once every week (every 7 days). Keep taking it unless your care team tells you to stop. It is important that you put your used needles and pens in a special sharps container. Do not put them in a trash can. If you do not have a sharps container, call your pharmacist or care team to get one. A special MedGuide will be given to you by the pharmacist with each prescription and refill. Be sure to read this information carefully each time. This medication comes with INSTRUCTIONS FOR USE. Ask your pharmacist for directions on how to use this medication. Read the information carefully. Talk to your pharmacist or care team if you have questions. Talk to your care team about  the use of this medication in children. While it may be prescribed for children as young as 12 years for selected conditions, precautions do apply. Overdosage: If you think you have taken too much of this medicine contact a poison control center or emergency room at once. NOTE: This medicine is only for you. Do not share this medicine with others. What if I miss a dose? If you miss a dose and the next scheduled dose is more than 2 days away, take the missed dose as soon as possible. If you miss a dose and the next scheduled dose is less than 2 days away, do not take the missed dose. Take the next dose at your regular time. Do not take double or extra doses. If you miss your dose for 2 weeks or more, take the next dose at your regular time or call your care team to talk about how to restart this medication. What may interact with this medication? Insulin and other medications for diabetes This list may not describe all possible interactions. Give your health care provider a list of all the medicines, herbs, non-prescription drugs, or dietary supplements you use. Also tell them if you smoke, drink alcohol, or use illegal drugs. Some items may interact with your medicine. What should I watch for while using this medication? Visit your care team for regular checks on your progress. It may be some time before you see the benefit from this medication. Drink plenty of fluids while taking this medication. Check with your care team if you have severe diarrhea, nausea, and vomiting, or if you sweat a   lot. The loss of too much body fluid may make it dangerous for you to take this medication. This medication may affect blood sugar levels. Ask your care team if changes in diet or medications are needed if you have diabetes. Talk to your care team if may be pregnant. Losing weight while pregnant is not advised and may cause harm to the fetus. Talk to your care team for more information. What side effects may I notice  from receiving this medication? Side effects that you should report to your care team as soon as possible: Allergic reactions--skin rash, itching, hives, swelling of the face, lips, tongue, or throat Change in vision Dehydration--increased thirst, dry mouth, feeling faint or lightheaded, headache, dark yellow or brown urine Gallbladder problems--severe stomach pain, nausea, vomiting, fever Heart palpitations--rapid, pounding, or irregular heartbeat Kidney injury--decrease in the amount of urine, swelling of the ankles, hands, or feet Pancreatitis--severe stomach pain that spreads to your back or gets worse after eating or when touched, fever, nausea, vomiting Thoughts of suicide or self-harm, worsening mood, feelings of depression Thyroid cancer--new mass or lump in the neck, pain or trouble swallowing, trouble breathing, hoarseness Side effects that usually do not require medical attention (report these to your care team if they continue or are bothersome): Diarrhea Loss of appetite Nausea Upset stomach This list may not describe all possible side effects. Call your doctor for medical advice about side effects. You may report side effects to FDA at 1-800-FDA-1088. Where should I keep my medication? Keep out of the reach of children and pets. Refrigeration (preferred): Store in the refrigerator. Do not freeze. Keep this medication in the original container until you are ready to take it. Get rid of any unused medication after the expiration date. Room temperature: If needed, prior to cap removal, the pen can be stored at room temperature for up to 28 days. Protect from light. If it is stored at room temperature, get rid of any unused medication after 28 days or after it expires, whichever is first. It is important to get rid of the medication as soon as you no longer need it or it is expired. You can do this in two ways: Take the medication to a medication take-back program. Check with your  pharmacy or law enforcement to find a location. If you cannot return the medication, follow the directions in the MedGuide. NOTE: This sheet is a summary. It may not cover all possible information. If you have questions about this medicine, talk to your doctor, pharmacist, or health care provider.  2024 Elsevier/Gold Standard (2022-03-20 00:00:00)  

## 2022-08-04 ENCOUNTER — Encounter: Payer: Self-pay | Admitting: Pharmacist

## 2022-08-04 ENCOUNTER — Ambulatory Visit: Payer: BC Managed Care – PPO | Attending: Family Medicine | Admitting: Pharmacist

## 2022-08-04 VITALS — BP 136/83 | HR 98

## 2022-08-04 DIAGNOSIS — I10 Essential (primary) hypertension: Secondary | ICD-10-CM

## 2022-08-04 NOTE — Progress Notes (Signed)
   S:    PCP: Dr. Laural Benes No chief complaint on file.  42 y.o. female who presents for hypertension evaluation, education, and management.  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 Delton Coombes), and sarcoid. Patient was referred and last seen by Primary Care Provider, Dr. Laural Benes, on 07/06/2022. Pharmacy last saw her in Jan of this year and started amlodipine. At her visit with Dr. Laural Benes last month, her BP was 147/89 and Dr. Laural Benes noted that she was not completely adherent to the amlodipine.   Today, patient arrives in good spirits and presents without assistance. Denies dizziness, headache, blurred vision, swelling.   Family/Social history:  HTN: father MI/Stroke: none Tobacco/alcohol use: never Caffeine: none  Current antihypertensive medications: amlodipine 5 mg daily  Pt endorses medication adherence. Takes in the evening and took last night.   Reported home BP readings: none given  Patient-reported exercise habits: none given  Patient-reported dietary habits:  -Denies excessive intake of caffeine  -Compliant with sodium restriction  O:  Vitals:   08/04/22 1132  BP: 136/83  Pulse: 98   Last 3 Office BP readings: BP Readings from Last 3 Encounters:  08/04/22 136/83  07/06/22 (!) 147/89  03/03/22 (!) 143/95   BMET    Component Value Date/Time   NA 140 01/31/2022 1002   K 4.0 01/31/2022 1002   CL 101 01/31/2022 1002   CO2 23 01/31/2022 1002   GLUCOSE 105 (H) 01/31/2022 1002   GLUCOSE 87 02/12/2015 1105   BUN 11 01/31/2022 1002   CREATININE 0.90 01/31/2022 1002   CREATININE 0.97 02/12/2015 1105   CALCIUM 9.4 01/31/2022 1002   GFRNONAA 76 02/12/2015 1105   GFRAA 88 02/12/2015 1105    Renal function: CrCl cannot be calculated (Patient's most recent lab result is older than the maximum 21 days allowed.).  Clinical ASCVD: No  The 10-year ASCVD risk score (Arnett DK, et al., 2019) is: 0.6%   Values used to  calculate the score:     Age: 22 years     Sex: Female     Is Non-Hispanic African American: No     Diabetic: No     Tobacco smoker: No     Systolic Blood Pressure: 136 mmHg     Is BP treated: Yes     HDL Cholesterol: 54 mg/dL     Total Cholesterol: 152 mg/dL   A/P: Hypertension dx'd. She is adherent to her amlodipine and denies any missed doses since last PCP visit. BP is currently above goal but much improved. BP goal < 130/80 mmHg.  -Continue amlodipine 5 mg daily. Will see her back in 1 month and consider increasing dose if BP continues to be above goal at that visit.  -Patient educated on purpose, proper use, and potential adverse effects of amlodipine.  -Counseled on lifestyle modifications for blood pressure control including reduced dietary sodium, increased exercise, adequate sleep. -Encouraged patient to check BP at home and bring log of readings to next visit. Counseled on proper use of home BP cuff.   Results reviewed and written information provided.    Written patient instructions provided. Patient verbalized understanding of treatment plan.  Total time in face to face counseling 25 minutes.    Follow-up:  Pharmacist in 1 month.  Butch Penny, PharmD, Patsy [place], CPP Clinical Pharmacist [facility] [phone]

## 2022-08-18 ENCOUNTER — Ambulatory Visit
Admission: RE | Admit: 2022-08-18 | Discharge: 2022-08-18 | Disposition: A | Discharge status: Home / Self Care | Payer: BC Managed Care – PPO | Source: Ambulatory Visit | Attending: Obstetrics & Gynecology | Admitting: Obstetrics & Gynecology

## 2022-08-18 DIAGNOSIS — Z1231 Encounter for screening mammogram for malignant neoplasm of breast: Secondary | ICD-10-CM

## 2022-08-18 LAB — HM MAMMOGRAPHY

## 2022-09-01 ENCOUNTER — Ambulatory Visit: Payer: BC Managed Care – PPO | Admitting: Pharmacist

## 2022-10-07 ENCOUNTER — Ambulatory Visit: Payer: BC Managed Care – PPO | Admitting: Internal Medicine

## 2022-10-26 ENCOUNTER — Encounter (HOSPITAL_COMMUNITY): Payer: Self-pay | Admitting: Emergency Medicine

## 2022-10-26 ENCOUNTER — Ambulatory Visit (HOSPITAL_COMMUNITY)
Admission: EM | Admit: 2022-10-26 | Discharge: 2022-10-26 | Disposition: A | Discharge status: Home / Self Care | Payer: BC Managed Care – PPO | Attending: Emergency Medicine | Admitting: Emergency Medicine

## 2022-10-26 DIAGNOSIS — N939 Abnormal uterine and vaginal bleeding, unspecified: Secondary | ICD-10-CM | POA: Insufficient documentation

## 2022-10-26 LAB — CBC
HCT: 32.9 % — ABNORMAL LOW (ref 36.0–46.0)
Hemoglobin: 10.1 g/dL — ABNORMAL LOW (ref 12.0–15.0)
MCH: 22.8 pg — ABNORMAL LOW (ref 26.0–34.0)
MCHC: 30.7 g/dL (ref 30.0–36.0)
MCV: 74.3 fL — ABNORMAL LOW (ref 80.0–100.0)
Platelets: 386 10*3/uL (ref 150–400)
RBC: 4.43 MIL/uL (ref 3.87–5.11)
RDW: 15.8 % — ABNORMAL HIGH (ref 11.5–15.5)
WBC: 10.4 10*3/uL (ref 4.0–10.5)
nRBC: 0 % (ref 0.0–0.2)

## 2022-10-26 LAB — BASIC METABOLIC PANEL
Anion gap: 10 (ref 5–15)
BUN: 11 mg/dL (ref 6–20)
CO2: 25 mmol/L (ref 22–32)
Calcium: 9.4 mg/dL (ref 8.9–10.3)
Chloride: 103 mmol/L (ref 98–111)
Creatinine, Ser: 1.03 mg/dL — ABNORMAL HIGH (ref 0.44–1.00)
GFR, Estimated: >60 mL/min (ref >60)
Glucose, Bld: 91 mg/dL (ref 70–99)
Potassium: 3.7 mmol/L (ref 3.5–5.1)
Sodium: 138 mmol/L (ref 135–145)

## 2022-10-26 MED ORDER — NAPROXEN 375 MG PO TABS: 375.0000 mg | ORAL_TABLET | Freq: Two times a day (BID) | ORAL | 0 refills | Status: DC

## 2022-10-26 MED ORDER — KETOROLAC TROMETHAMINE 60 MG/2ML IM SOLN
30.0000 mg | Freq: Once | INTRAMUSCULAR | Status: AC
  Administered 2022-10-26: 30 mg via INTRAMUSCULAR

## 2022-10-26 MED ORDER — KETOROLAC TROMETHAMINE 30 MG/ML IJ SOLN
INTRAMUSCULAR | Status: AC
  Filled 2022-10-26: qty 1

## 2022-10-26 NOTE — ED Triage Notes (Signed)
Lower abdominal pain and vaginal bleeding with lots of clots x 2 weeks. States she had her Depo injection x 1 one month ago, states the last time she had the injection this didn't happen. Reports some fatigue

## 2022-10-26 NOTE — ED Provider Notes (Addendum)
[facility]    CSN: [id number] Arrival date & time: 10/26/22  1621      History   Chief Complaint Chief Complaint  Patient presents with   Vaginal Bleeding    HPI Sarah Arnold is a 42 y.o. female.   Patient presents to clinic for continued vaginal bleeding that has been ongoing for the past 2 weeks.  Reports cramping. She got her Depo injection almost 2 months ago, reports this was her first injection.   Endorses using about 7 maxi pads per day, size 5, and fully saturating them. Reports dizziness and weakness today as well as fatigue. No LOC. No dysuria or vaginal discharge.   Has tried ibuprofen 800 mg, did not help. Denies nausea, emesis or dysuria.   The history is provided by the patient and medical records.  Vaginal Bleeding Associated symptoms: abdominal pain and fatigue   Associated symptoms: no dysuria, no fever, no nausea and no vaginal discharge     Past Medical History:  Diagnosis Date   Abnormal Pap smear    Anemia    Late prenatal care    Pneumococcal pneumonia [facility])     Patient Active Problem List   Diagnosis Date Noted   Class 2 severe obesity with serious comorbidity and body mass index (BMI) of 35.0 to 35.9 in adult [facility]) 01/31/2022   Healthcare maintenance 02/12/2015   Multiple skin nodules 05/15/2014   Sarcoidosis 05/15/2014   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   Gastritis 05/14/2013   Allergic rhinitis 04/10/2013   Cough 02/11/2013   Shortness of breath 02/11/2013   LSIL (low grade squamous intraepithelial lesion) on Pap smear 01/22/2013    Past Surgical History:  Procedure Laterality Date   NO PAST SURGERIES     VIDEO BRONCHOSCOPY Bilateral 04/17/2013   Procedure: VIDEO BRONCHOSCOPY WITHOUT FLUORO;  Surgeon: Leslye Peer, MD;  Location: [facility];  Service: Cardiopulmonary;  Laterality: Bilateral;    OB History     Gravida  6   Para  6   Term  6   Preterm      AB      Living   6      SAB      IAB      Ectopic      Multiple      Live Births  6            Home Medications    Prior to Admission medications   Medication Sig Start Date End Date Taking? Authorizing Provider  amLODipine (NORVASC) 5 MG tablet Take 1 tablet (5 mg total) by mouth daily. 07/06/22  Yes Marcine Matar, MD  naproxen (NAPROSYN) 375 MG tablet Take 1 tablet (375 mg total) by mouth 2 (two) times daily. 10/26/22  Yes Keerat Denicola, [place] N, FNP    Family History Family History  Problem Relation Age of Onset   Asthma Son    Arthritis Neg Hx    Alcohol abuse Neg Hx    Birth defects Neg Hx    Cancer Neg Hx    COPD Neg Hx    Depression Neg Hx    Diabetes Neg Hx    Drug abuse Neg Hx    Early death Neg Hx    Hearing loss Neg Hx    Heart disease Neg Hx    Hyperlipidemia Neg Hx    Hypertension Neg Hx    Kidney disease Neg Hx    Learning disabilities Neg Hx  Mental illness Neg Hx    Mental retardation Neg Hx    Miscarriages / Stillbirths Neg Hx    Stroke Neg Hx    Vision loss Neg Hx     Social History Social History   Tobacco Use   Smoking status: Never   Smokeless tobacco: Never  Vaping Use   Vaping status: Never Used  Substance Use Topics   Alcohol use: No   Drug use: No     Allergies   Patient has no known allergies.   Review of Systems Review of Systems  Constitutional:  Positive for fatigue. Negative for fever.  Gastrointestinal:  Positive for abdominal pain. Negative for nausea and vomiting.  Genitourinary:  Positive for menstrual problem and vaginal bleeding. Negative for decreased urine volume, dysuria, flank pain, hematuria and vaginal discharge.     Physical Exam Triage Vital Signs ED Triage Vitals  Encounter Vitals Group     BP 10/26/22 1735 137/89     Systolic BP Percentile --      Diastolic BP Percentile --      Pulse Rate 10/26/22 1735 86     Resp 10/26/22 1735 16     Temp 10/26/22 1735 98.6 F (37 C)     Temp Source 10/26/22  1735 Oral     SpO2 10/26/22 1735 98 %     Weight --      Height --      Head Circumference --      Peak Flow --      Pain Score 10/26/22 1736 7     Pain Loc --      Pain Education --      Exclude from Growth Chart --    No data found.  Updated Vital Signs BP 137/89 (BP Location: Left Arm)   Pulse 86   Temp 98.6 F (37 C) (Oral)   Resp 16   SpO2 98%   Visual Acuity Right Eye Distance:   Left Eye Distance:   Bilateral Distance:    Right Eye Near:   Left Eye Near:    Bilateral Near:     Physical Exam Vitals and nursing note reviewed.  Constitutional:      Appearance: Normal appearance.  HENT:     Head: Normocephalic and atraumatic.     Right Ear: External ear normal.     Left Ear: External ear normal.     Nose: Nose normal.     Mouth/Throat:     Mouth: Mucous membranes are moist.  Eyes:     Conjunctiva/sclera: Conjunctivae normal.  Cardiovascular:     Rate and Rhythm: Normal rate and regular rhythm.     Heart sounds: Normal heart sounds. No murmur heard. Pulmonary:     Effort: Pulmonary effort is normal. No respiratory distress.     Breath sounds: Normal breath sounds.  Abdominal:     General: Abdomen is flat. There is no distension.     Palpations: Abdomen is soft. There is no mass.     Tenderness: There is no abdominal tenderness.     Hernia: No hernia is present.  Musculoskeletal:        General: Normal range of motion.  Skin:    General: Skin is warm and dry.  Neurological:     General: No focal deficit present.     Mental Status: She is alert and oriented to person, place, and time.  Psychiatric:        Mood and Affect: Mood normal.  Behavior: Behavior normal.      UC Treatments / Results  Labs (all labs ordered are listed, but only abnormal results are displayed) Labs Reviewed  BASIC METABOLIC PANEL  CBC    EKG   Radiology No results found.  Procedures Procedures (including critical care time)  Medications Ordered in  UC Medications  ketorolac (TORADOL) injection 30 mg (has no administration in time range)    Initial Impression / Assessment and Plan / UC Course  I have reviewed the triage vital signs and the nursing notes.  Pertinent labs & imaging results that were available during my care of the patient were reviewed by me and considered in my medical decision making (see chart for details).  Vitals and triage reviewed, patient is hemodynamically stable.  Her abdomen is soft and nontender.  Without dysuria, fevers or flank pain. Vaginal bleeding is continued for 2 weeks, reports saturating 7 pads per day.  Suspect abnormal uterine bleeding due to Depo injections.  Will check CBC and CMP in clinic to ensure no urgent electrolyte or metabolic abnormalities.  Requesting pain management in clinic, will trial IM Toradol and start oral NSAIDs tomorrow.  OB/GYN f/u to discuss if this is worth continuing.  Emergency precautions given.  Plan of care, follow-up care and return precautions discussed, no questions at this time.    Final Clinical Impressions(s) / UC Diagnoses   Final diagnoses:  Abnormal uterine bleeding (AUB)     Discharge Instructions      I believe you are experiencing breakthrough vaginal bleeding with your Depo injection, Depo injections can take up to 6 months to year to fully regulate your menstrual cycles.  We are checking some basic labs today and we will contact you if anything requires urgent follow-up.  We have given you a shot in clinic to help with your pain, you can start the naproxen tomorrow.  Please follow-up with your OB/GYN if your vaginal bleeding persists she can discuss other contraception options versus further treatment.  Seek immediate care if you develop syncope or loss of consciousness.       ED Prescriptions     Medication Sig Dispense Auth. Provider   naproxen (NAPROSYN) 375 MG tablet Take 1 tablet (375 mg total) by mouth 2 (two) times daily. 20 tablet  Johnnie Goynes, [place] N, [place]      PDMP not reviewed this encounter.       Winson Eichorn, [place] N, [place] 10/26/22 [phone]

## 2022-10-26 NOTE — Discharge Instructions (Addendum)
I believe you are experiencing breakthrough vaginal bleeding with your Depo injection, Depo injections can take up to 6 months to year to fully regulate your menstrual cycles.  We are checking some basic labs today and we will contact you if anything requires urgent follow-up.  We have given you a shot in clinic to help with your pain, you can start the naproxen tomorrow.  Please follow-up with your OB/GYN if your vaginal bleeding persists she can discuss other contraception options versus further treatment.  Seek immediate care if you develop syncope or loss of consciousness.

## 2023-01-19 ENCOUNTER — Encounter: Payer: Self-pay | Admitting: Internal Medicine

## 2023-01-19 ENCOUNTER — Other Ambulatory Visit: Payer: Self-pay

## 2023-01-19 ENCOUNTER — Ambulatory Visit: Payer: BC Managed Care – PPO | Attending: Internal Medicine | Admitting: Internal Medicine

## 2023-01-19 VITALS — BP 155/97 | HR 92 | Temp 98.6°F | Ht 64.0 in | Wt 206.0 lb

## 2023-01-19 DIAGNOSIS — I10 Essential (primary) hypertension: Secondary | ICD-10-CM | POA: Diagnosis not present

## 2023-01-19 DIAGNOSIS — Z2821 Immunization not carried out because of patient refusal: Secondary | ICD-10-CM | POA: Diagnosis not present

## 2023-01-19 DIAGNOSIS — D509 Iron deficiency anemia, unspecified: Secondary | ICD-10-CM

## 2023-01-19 DIAGNOSIS — J3089 Other allergic rhinitis: Secondary | ICD-10-CM

## 2023-01-19 MED ORDER — LORATADINE 10 MG PO TABS
10.0000 mg | ORAL_TABLET | Freq: Every day | ORAL | 2 refills | Status: DC | PRN
Start: 2023-01-19 — End: 2023-12-02
  Filled 2023-01-19: qty 30, 30d supply, fill #0

## 2023-01-19 MED ORDER — VALSARTAN-HYDROCHLOROTHIAZIDE 80-12.5 MG PO TABS
0.5000 | ORAL_TABLET | Freq: Every day | ORAL | 1 refills | Status: DC
Start: 2023-01-19 — End: 2023-05-22
  Filled 2023-01-19: qty 45, 90d supply, fill #0
  Filled 2023-03-31: qty 45, 90d supply, fill #1

## 2023-01-19 NOTE — Patient Instructions (Addendum)
Stop amlodipine.  Start valsartan/hydrochlorothiazide 80/12.5 mg taking 1/2 tablet daily. Continue to monitor blood pressure at least once a week with goal being 130/80 or lower.    We will have you follow-up with our clinical pharmacist in 3 weeks for repeat blood pressure check.  Please bring your readings with you.

## 2023-01-19 NOTE — Progress Notes (Signed)
Patient ID: Sarah Arnold, female    DOB: 08/23/1980  MRN: [id number]  CC: Hypertension (HTN f/u. Med refills. /Reports not taking amlodipine consistently due to amlodipine constipation /No to flu vax)   Subjective: Fredonia Veigel is a 42 y.o. female who presents for chronic ds management. 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 Delton Coombes), sarcoid, LSIL on pap, obesity   HTN: On last visit with me in June, patient was not taking the amlodipine 5 mg consistently every day because she felt it was not helping.  Discussed the importance of taking the medicine consistently.  I had her follow-up with the clinical pharmacist 1 month later and blood pressure was much better -Presents today again with blood pressure elevated stating that she has not been taking the amlodipine consistently because it causes significant constipation -limits salt in foods -checks BP once a wk.  Last reading was 136/? Several days ago  Seen at [facility] 10/26/2022 for vaginal bleeding for 2 wks.  Had received Depo inj 2 mths prior.  Reassurance given and told to use NSAID.  Started on Depo through her GYN.  Bleeding has stopped shortly after that visit to UC  Complains of allergy symptoms of sneezing and runny itchy eyes over the past several days.  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 90 tablet 2   naproxen (NAPROSYN) 375 MG tablet Take 1 tablet (375 mg total) by mouth 2 (two) times daily. (Patient not taking: Reported on 01/19/2023) 20 tablet 0   No current facility-administered medications on file prior to visit.    No Known Allergies  Social History   Socioeconomic History   Marital status: Married    Spouse name: Not on file   Number of children: 6   Years of education: Not on file   Highest education level: Not on file  Occupational History   Occupation: unemployed  Tobacco Use   Smoking status: Never   Smokeless tobacco: Never  Vaping Use   Vaping status: Never Used  Substance and Sexual Activity   Alcohol use: No   Drug use: No   Sexual activity: Not Currently    Birth control/protection: Implant  Other Topics Concern   Not on file  Social History Narrative   Immigrated from [place] in 2001   No known TB exposures   Lives now in [place] in [place]   Social Drivers of Health   Financial Resource Strain: Low Risk  (01/19/2023)   Overall Financial Resource Strain (CARDIA)    Difficulty of Paying Living Expenses: Not hard at all  Food Insecurity: No Food Insecurity (01/19/2023)   Hunger Vital Sign    Worried About Running Out of Food in the Last Year: Never  true    Ran Out of Food in the Last Year: Never true  Transportation Needs: No Transportation Needs (01/19/2023)   PRAPARE - Administrator, Civil Service (Medical): No    Lack of Transportation (Non-Medical): No  Physical Activity: Insufficiently Active (01/19/2023)   Exercise Vital Sign    Days of Exercise per Week: 2 days    Minutes of Exercise per Session: 20 min  Stress: No Stress Concern Present (01/19/2023)   [employer] of Occupational Health - Occupational Stress Questionnaire    Feeling of Stress : Not at all  Social Connections: Moderately Integrated  (01/19/2023)   Social Connection and Isolation Panel [NHANES]    Frequency of Communication with Friends and Family: More than three times a week    Frequency of Social Gatherings with Friends and Family: More than three times a week    Attends Religious Services: Never    Database administrator or Organizations: Yes    Attends Engineer, structural: More than 4 times per year    Marital Status: Married  Catering manager Violence: Not At Risk (01/19/2023)   Humiliation, Afraid, Rape, and Kick questionnaire    Fear of Current or Ex-Partner: No    Emotionally Abused: No    Physically Abused: No    Sexually Abused: No    Family History  Problem Relation Age of Onset   Asthma Son    Arthritis Neg Hx    Alcohol abuse Neg Hx    Birth defects Neg Hx    Cancer Neg Hx    COPD Neg Hx    Depression Neg Hx    Diabetes Neg Hx    Drug abuse Neg Hx    Early death Neg Hx    Hearing loss Neg Hx    Heart disease Neg Hx    Hyperlipidemia Neg Hx    Hypertension Neg Hx    Kidney disease Neg Hx    Learning disabilities Neg Hx    Mental illness Neg Hx    Mental retardation Neg Hx    Miscarriages / Stillbirths Neg Hx    Stroke Neg Hx    Vision loss Neg Hx     Past Surgical History:  Procedure Laterality Date   NO PAST SURGERIES     VIDEO BRONCHOSCOPY Bilateral 04/17/2013   Procedure: VIDEO BRONCHOSCOPY WITHOUT FLUORO;  Surgeon: Leslye Peer, MD;  Location: [facility];  Service: Cardiopulmonary;  Laterality: Bilateral;    ROS: Review of Systems Negative except as stated above  PHYSICAL EXAM: BP (!) 155/97   Pulse 92   Temp 98.6 F (37 C) (Oral)   Ht 5\' 4"  (1.626 m)   Wt 206 lb (93.4 kg)   SpO2 99%   BMI 35.36 kg/m   Wt Readings from Last 3 Encounters:  01/19/23 206 lb (93.4 kg)  07/06/22 213 lb (96.6 kg)  01/31/22 209 lb (94.8 kg)    Physical Exam  General appearance - alert, well appearing, middle age female and in no distress Mental status - normal mood,  behavior, speech, dress, motor activity, and thought processes Nose: Mild enlargement of nasal turbinates Eyes: No conjunctival injection Chest - clear to auscultation, no wheezes, rales or rhonchi, symmetric air entry Heart - normal rate, regular rhythm, normal S1, S2, no murmurs, rubs, clicks or gallops Extremities - peripheral pulses normal, no pedal edema, no clubbing or cyanosis      Latest Ref Rng & Units 10/26/2022  5:59 PM 01/31/2022   10:02 AM 02/12/2015   11:05 AM  CMP  Glucose 70 - 99 mg/dL 91  191  87   BUN 6 - 20 mg/dL 11  11  17    Creatinine 0.44 - 1.00 mg/dL 4.78  2.95  6.21   Sodium 135 - 145 mmol/L 138  140  136   Potassium 3.5 - 5.1 mmol/L 3.7  4.0  4.3   Chloride 98 - 111 mmol/L 103  101  100   CO2 22 - 32 mmol/L 25  23  26    Calcium 8.9 - 10.3 mg/dL 9.4  9.4  9.7   Total Protein 6.0 - 8.5 g/dL  7.2  7.5   Total Bilirubin 0.0 - 1.2 mg/dL  <3.0  0.3   Alkaline Phos 44 - 121 IU/L  102  80   AST 0 - 40 IU/L  11  15   ALT 0 - 32 IU/L  13  15    Lipid Panel     Component Value Date/Time   CHOL 152 01/31/2022 1002   TRIG 68 01/31/2022 1002   HDL 54 01/31/2022 1002   CHOLHDL 2.8 01/31/2022 1002   CHOLHDL 2.9 05/15/2014 1037   VLDL 17 05/15/2014 1037   LDLCALC 85 01/31/2022 1002    CBC    Component Value Date/Time   WBC 10.4 10/26/2022 1759   RBC 4.43 10/26/2022 1759   HGB 10.1 (L) 10/26/2022 1759   HGB 11.4 01/31/2022 1002   HCT 32.9 (L) 10/26/2022 1759   HCT 37.1 01/31/2022 1002   PLT 386 10/26/2022 1759   PLT 381 01/31/2022 1002   MCV 74.3 (L) 10/26/2022 1759   MCV 75 (L) 01/31/2022 1002   MCH 22.8 (L) 10/26/2022 1759   MCHC 30.7 10/26/2022 1759   RDW 15.8 (H) 10/26/2022 1759   RDW 14.8 01/31/2022 1002   LYMPHSABS 2.3 02/12/2015 1105   MONOABS 0.8 02/12/2015 1105   EOSABS 0.7 02/12/2015 1105   BASOSABS 0.1 02/12/2015 1105    ASSESSMENT AND PLAN: 1. Essential hypertension Not at goal.  Patient reports significant constipation with  amlodipine.  Even from past visit, it was difficult getting her to take amlodipine consistently as she did not feel it was working.  I recommend that we stop the amlodipine and use Diovan/HCTZ 80/12.5 mg 1/2 tab daily instead.  She is agreeable to this.  Will have her follow-up with clinical pharmacist in several weeks for recheck.  She will need to have BMP done at that time. - valsartan-hydrochlorothiazide (DIOVAN-HCT) 80-12.5 MG tablet; 1/2 tab PO daily  Dispense: 45 tablet; Refill: 1  2. Microcytic anemia (Primary) - Iron, TIBC and Ferritin Panel - CBC  3. Non-seasonal allergic rhinitis, unspecified trigger - loratadine (CLARITIN) 10 MG tablet; Take 1 tablet (10 mg total) by mouth daily as needed for allergies.  Dispense: 30 tablet; Refill: 2  4.  Influenza Vaccine declined    Patient was given the opportunity to ask questions.  Patient verbalized understanding of the plan and was able to repeat key elements of the plan.   This documentation was completed using Paediatric nurse.  Any transcriptional errors are unintentional.  Orders Placed This Encounter  Procedures   Iron, TIBC and Ferritin Panel   CBC     Requested Prescriptions   Signed Prescriptions Disp Refills   valsartan-hydrochlorothiazide (DIOVAN-HCT) 80-12.5 MG tablet 45 tablet 1    Sig: Take 0.5 tablets by mouth daily.   loratadine (CLARITIN) 10  MG tablet 30 tablet 2    Sig: Take 1 tablet (10 mg total) by mouth daily as needed for allergies.    Return in about 4 months (around 05/20/2023) for Appt with [facility] in 3 wks for BP check.  Jonah Blue, MD, FACP

## 2023-01-20 ENCOUNTER — Other Ambulatory Visit: Payer: Self-pay | Admitting: Internal Medicine

## 2023-01-20 ENCOUNTER — Other Ambulatory Visit: Payer: Self-pay

## 2023-01-20 DIAGNOSIS — D509 Iron deficiency anemia, unspecified: Secondary | ICD-10-CM

## 2023-01-20 LAB — IRON,TIBC AND FERRITIN PANEL
Ferritin: 19 ng/mL (ref 15–150)
Iron Saturation: 4 % — CL (ref 15–55)
Iron: 16 ug/dL — ABNORMAL LOW (ref 27–159)
Total Iron Binding Capacity: 385 ug/dL (ref 250–450)
UIBC: 369 ug/dL (ref 131–425)

## 2023-01-20 LAB — CBC
Hematocrit: 32.8 % — ABNORMAL LOW (ref 34.0–46.6)
Hemoglobin: 9.4 g/dL — ABNORMAL LOW (ref 11.1–15.9)
MCH: 19.6 pg — ABNORMAL LOW (ref 26.6–33.0)
MCHC: 28.7 g/dL — ABNORMAL LOW (ref 31.5–35.7)
MCV: 68 fL — ABNORMAL LOW (ref 79–97)
Platelets: 558 10*3/uL — ABNORMAL HIGH (ref 150–450)
RBC: 4.8 x10E6/uL (ref 3.77–5.28)
RDW: 17 % — ABNORMAL HIGH (ref 11.7–15.4)
WBC: 10.4 10*3/uL (ref 3.4–10.8)

## 2023-01-20 MED ORDER — FERROUS SULFATE 325 (65 FE) MG PO TABS
325.0000 mg | ORAL_TABLET | Freq: Every day | ORAL | 0 refills | Status: DC
Start: 2023-01-20 — End: 2023-02-18
  Filled 2023-01-20: qty 100, 100d supply, fill #0

## 2023-01-27 ENCOUNTER — Telehealth: Payer: Self-pay

## 2023-01-27 NOTE — Telephone Encounter (Signed)
-----   Message from Nurse Eustace Pen B sent at 01/27/2023  1:40 PM EST -----  ----- Message ----- From: Hoy Register, MD Sent: 01/27/2023  12:48 PM EST To: Johna Roles, CMA  This inform her that her mammogram is negative for malignancy

## 2023-01-27 NOTE — Telephone Encounter (Signed)
 Pt was called and is aware of results, DOB was confirmed.  ?

## 2023-02-12 NOTE — Progress Notes (Unsigned)
S:    PCP: Dr. Laural Benes No chief complaint on file.  43 y.o. female who presents for hypertension evaluation, education, and management.  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 Delton Coombes), and sarcoid.   Patient was referred and last seen by Primary Care Provider, Dr. Laural Benes, on 01/19/23. A last pharmacist visit on July 2024 BP was 136/83 and patient was counseled on lifestyle interventions and adherence. Unfortunately, she did not attend her 1-mo f/u pharmacy appt and pharmacotherapy was not adjusted. At last PCP appt, BP was elevated to 155/97 and pt was not taking amlodipine due to constipation. She was started on valsartan/hydrochlorothiazide at 40/6.25 mg (1/2 tab of 80/12.5 mg) daily. Pt was also initiated on iron supplementation. Due for repeat BMP.   Today, patient arrives in good spirits and presents without assistance. Denies dizziness, headache, blurred vision, swelling. She is tolerating the valsartan-hydrochlorothiazide. Denies any side effects. In particular, she tells me the constipation and HA that she used to experience with amlodipine has resolved.   Family/Social history: HTN in father MI/Stroke: none Tobacco/alcohol use: never Caffeine: none  Current antihypertensive medications: valsartan/hydrochlorothiazide at 40/6.25 mg (1/2 tab of 80/12.5 mg) daily. Pt endorses medication adherence. Takes in the evening and took last night.   Reported home BP readings:  -112/79 mmHg -132/90 mmHg  - 114/86 mmHg - 122/82 mmHg  Patient-reported exercise habits: none   Patient-reported dietary habits:  -Denies excessive intake of caffeine  -Compliant with sodium restriction  O:  Vitals:   02/13/23 0954  BP: 120/83  Pulse: 83    Last 3 Office BP readings: BP Readings from Last 3 Encounters:  02/13/23 120/83  01/19/23 (!) 155/97  10/26/22 137/89   BMET    Component Value Date/Time   NA 138 10/26/2022 1759   NA  140 01/31/2022 1002   K 3.7 10/26/2022 1759   CL 103 10/26/2022 1759   CO2 25 10/26/2022 1759   GLUCOSE 91 10/26/2022 1759   BUN 11 10/26/2022 1759   BUN 11 01/31/2022 1002   CREATININE 1.03 (H) 10/26/2022 1759   CREATININE 0.97 02/12/2015 1105   CALCIUM 9.4 10/26/2022 1759   GFRNONAA >60 10/26/2022 1759   GFRNONAA 76 02/12/2015 1105   GFRAA 88 02/12/2015 1105    Renal function: CrCl cannot be calculated (Patient's most recent lab result is older than the maximum 21 days allowed.).  Clinical ASCVD: No  The 10-year ASCVD risk score (Arnett DK, et al., 2019) is: 0.5%   Values used to calculate the score:     Age: 84 years     Sex: Female     Is Non-Hispanic African American: No     Diabetic: No     Tobacco smoker: No     Systolic Blood Pressure: 120 mmHg     Is BP treated: Yes     HDL Cholesterol: 54 mg/dL     Total Cholesterol: 152 mg/dL   A/P: Hypertension close to goal given BP today. Goal < 130/80 mmHg. She is adherent to her valsartan-HCTZ and denies any missed doses since last PCP visit. She is not fasting today. I have entered a future BMP, and she verbalizes understanding to come in later this week when she is fasting to complete it.  -Continue valsartan-hydrochlorothiazide at her current dose. -Patient educated on purpose, proper use, and potential adverse effects of amlodipine.  -Counseled on lifestyle modifications for blood pressure control including reduced dietary  sodium, increased exercise, adequate sleep. -Encouraged patient to check BP at home and bring log of readings to next visit. Counseled on proper use of home BP cuff.  -Future BMP8+eGFR  Results reviewed and written information provided.    Written patient instructions provided. Patient verbalized understanding of treatment plan.  Total time in face to face counseling 25 minutes.    Follow-up:  Pharmacist in 1 month.  Butch Penny, PharmD, P[place]y [place], CPP Clinical Pharmacist [facility]  [facility] [phone]

## 2023-02-13 ENCOUNTER — Ambulatory Visit: Payer: BC Managed Care – PPO | Attending: Nurse Practitioner | Admitting: Pharmacist

## 2023-02-13 ENCOUNTER — Encounter: Payer: Self-pay | Admitting: Pharmacist

## 2023-02-13 VITALS — BP 120/83 | HR 83

## 2023-02-13 DIAGNOSIS — I10 Essential (primary) hypertension: Secondary | ICD-10-CM | POA: Diagnosis not present

## 2023-02-17 ENCOUNTER — Other Ambulatory Visit: Payer: BC Managed Care – PPO

## 2023-02-17 ENCOUNTER — Ambulatory Visit: Payer: BC Managed Care – PPO | Attending: Internal Medicine

## 2023-02-17 DIAGNOSIS — D509 Iron deficiency anemia, unspecified: Secondary | ICD-10-CM

## 2023-02-17 DIAGNOSIS — I10 Essential (primary) hypertension: Secondary | ICD-10-CM

## 2023-02-18 ENCOUNTER — Other Ambulatory Visit: Payer: Self-pay | Admitting: Internal Medicine

## 2023-02-18 DIAGNOSIS — D509 Iron deficiency anemia, unspecified: Secondary | ICD-10-CM

## 2023-02-18 LAB — CBC
Hematocrit: 32.9 % — ABNORMAL LOW (ref 34.0–46.6)
Hemoglobin: 9.6 g/dL — ABNORMAL LOW (ref 11.1–15.9)
MCH: 20.2 pg — ABNORMAL LOW (ref 26.6–33.0)
MCHC: 29.2 g/dL — ABNORMAL LOW (ref 31.5–35.7)
MCV: 69 fL — ABNORMAL LOW (ref 79–97)
Platelets: 454 10*3/uL — ABNORMAL HIGH (ref 150–450)
RBC: 4.75 x10E6/uL (ref 3.77–5.28)
RDW: 20.7 % — ABNORMAL HIGH (ref 11.7–15.4)
WBC: 8.9 10*3/uL (ref 3.4–10.8)

## 2023-02-18 LAB — BMP8+EGFR
BUN/Creatinine Ratio: 14 (ref 9–23)
BUN: 15 mg/dL (ref 6–24)
CO2: 24 mmol/L (ref 20–29)
Calcium: 9.6 mg/dL (ref 8.7–10.2)
Chloride: 104 mmol/L (ref 96–106)
Creatinine, Ser: 1.04 mg/dL — ABNORMAL HIGH (ref 0.57–1.00)
Glucose: 107 mg/dL — ABNORMAL HIGH (ref 70–99)
Potassium: 4.2 mmol/L (ref 3.5–5.2)
Sodium: 141 mmol/L (ref 134–144)
eGFR: 69 mL/min/{1.73_m2} (ref >59)

## 2023-02-18 MED ORDER — FERROUS SULFATE 325 (65 FE) MG PO TABS
325.0000 mg | ORAL_TABLET | Freq: Two times a day (BID) | ORAL | 0 refills | Status: DC
Start: 2023-02-18 — End: 2023-11-20
  Filled 2023-02-18: qty 100, 50d supply, fill #0

## 2023-02-18 NOTE — Progress Notes (Signed)
Let pt know that she is still anemic and blood count has not improved from 1 month ago.  Please inquire whether she has been taking the iron supplement daily as prescribed.  If she has been taking consistently, then she should increase to one tablet twice a day. Will send updated prescription to the pharmacy.  Taking it with a little bit of orange juice will help to increase absorption in the gut. Kidney function okay on current blood pressure medication.

## 2023-02-20 ENCOUNTER — Other Ambulatory Visit: Payer: Self-pay

## 2023-02-27 ENCOUNTER — Other Ambulatory Visit: Payer: BC Managed Care – PPO

## 2023-03-03 ENCOUNTER — Other Ambulatory Visit: Payer: BC Managed Care – PPO

## 2023-03-06 ENCOUNTER — Ambulatory Visit: Payer: BC Managed Care – PPO

## 2023-03-20 ENCOUNTER — Ambulatory Visit: Payer: BC Managed Care – PPO | Admitting: Internal Medicine

## 2023-03-31 ENCOUNTER — Other Ambulatory Visit: Payer: Self-pay

## 2023-05-22 ENCOUNTER — Ambulatory Visit: Attending: Internal Medicine | Admitting: Internal Medicine

## 2023-05-22 ENCOUNTER — Ambulatory Visit: Payer: BC Managed Care – PPO | Admitting: Internal Medicine

## 2023-05-22 ENCOUNTER — Other Ambulatory Visit: Payer: Self-pay

## 2023-05-22 ENCOUNTER — Encounter: Payer: Self-pay | Admitting: Internal Medicine

## 2023-05-22 VITALS — BP 114/76 | HR 82 | Ht 64.0 in | Wt 203.0 lb

## 2023-05-22 DIAGNOSIS — Z23 Encounter for immunization: Secondary | ICD-10-CM | POA: Diagnosis not present

## 2023-05-22 DIAGNOSIS — I10 Essential (primary) hypertension: Secondary | ICD-10-CM

## 2023-05-22 DIAGNOSIS — Z3009 Encounter for other general counseling and advice on contraception: Secondary | ICD-10-CM | POA: Diagnosis not present

## 2023-05-22 DIAGNOSIS — K5903 Drug induced constipation: Secondary | ICD-10-CM

## 2023-05-22 DIAGNOSIS — S61112A Laceration without foreign body of left thumb with damage to nail, initial encounter: Secondary | ICD-10-CM

## 2023-05-22 DIAGNOSIS — D500 Iron deficiency anemia secondary to blood loss (chronic): Secondary | ICD-10-CM | POA: Diagnosis not present

## 2023-05-22 MED ORDER — VALSARTAN-HYDROCHLOROTHIAZIDE 80-12.5 MG PO TABS
1.0000 | ORAL_TABLET | Freq: Every day | ORAL | 1 refills | Status: DC
  Filled 2023-05-22: qty 90, 90d supply, fill #0

## 2023-05-22 MED ORDER — POLYETHYLENE GLYCOL 3350 17 GM/SCOOP PO POWD
17.0000 g | Freq: Every day | ORAL | 1 refills | Status: AC | PRN
Start: 2023-05-22 — End: ?
  Filled 2023-05-22: qty 238, 14d supply, fill #0

## 2023-05-22 NOTE — Progress Notes (Signed)
 Patient ID: Sarah Arnold, female    DOB: 05-14-1980  MRN: [id number]  CC: Hypertension (HTN f/u. Vallery Gavel on thumb nail of L hand - cut with knife)   Subjective: Sarah Arnold is a 43 y.o. female who presents for chronic ds management. 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  We changed her Diovan /HCTZ 80/12.5 mg half a tablet daily. Tells me she is taking the full pill daily.  Checks BP but not recently. Limits salt in foods  -Taking and tolerating medication.  Accidentally cut LT thumb yesterday with knife while cutting yum. She was not seen in ER/UC.  Took about 30 mins for bleeding to stop with compression.  Painful/throbbing. Not up to date with Tdapt vaccine.  Anemia: Noted to have microcytic anemia with last hemoglobin stable around 9.6.  Iron studies consistent with iron deficiency.  Iron supplement prescribed.  She was advised to follow-up in 4 to 6 weeks for repeat level check.  Hemoglobin had not improved.  She was advised to increase the iron supplement to twice a day and take with a little bit of orange juice. -Reports taking the iron supplement but stopped sometime last mth due to constipation No fatigue or dizziness Was on Depo shot through. Gets cycle every mth last 4-5 days and light. Does not want to go back on Depo because it cause heavy bleeding for 2 weeks.  Wants to be consider for other methods. Patient Active Problem List   Diagnosis Date Noted   Class 2 severe obesity with serious comorbidity and body mass index (BMI) of 35.0 to 35.9 in adult [facility]) 01/31/2022   Healthcare maintenance 02/12/2015   Multiple skin nodules 05/15/2014   Sarcoidosis 05/15/2014   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   Gastritis 05/14/2013    Allergic rhinitis 04/10/2013   Cough 02/11/2013   Shortness of breath 02/11/2013   LSIL (low grade squamous intraepithelial lesion) on Pap smear 01/22/2013     Current Outpatient Medications on File Prior to Visit  Medication Sig Dispense Refill   ferrous sulfate  325 (65 FE) MG tablet Take 1 tablet (325 mg total) by mouth 2 (two) times daily with a meal. 100 tablet 0   loratadine  (CLARITIN ) 10 MG tablet Take 1 tablet (10 mg total) by mouth daily as needed for allergies. 30 tablet 2   medroxyPROGESTERone (DEPO-PROVERA) 150 MG/ML injection Inject 150 mg into the muscle every 3 (three) months.     valsartan -hydrochlorothiazide  (DIOVAN -HCT) 80-12.5 MG tablet Take 0.5 tablets by mouth daily. 45 tablet 1   naproxen  (NAPROSYN ) 375 MG tablet Take 1 tablet (375 mg total) by mouth 2 (two) times daily. (Patient not taking: Reported on 05/22/2023) 20 tablet 0   No current facility-administered medications on file prior to visit.    Allergies  Allergen Reactions   Norvasc  [Amlodipine ]     Caused constipation    Social History   Socioeconomic History   Marital status: Married    Spouse name: Not on file   Number of children: 6   Years of education: Not on file   Highest education level: Not on file  Occupational History   Occupation: unemployed  Tobacco Use   Smoking status: Never   Smokeless tobacco: Never  Vaping Use  Vaping status: Never Used  Substance and Sexual Activity   Alcohol use: No   Drug use: No   Sexual activity: Not Currently    Birth control/protection: Implant  Other Topics Concern   Not on file  Social History Narrative   Immigrated from [place] in 2001   No known TB exposures   Lives now in [place] in [place]   Social Drivers of Health   Financial Resource Strain: Low Risk  (01/19/2023)   Overall Financial Resource Strain (CARDIA)    Difficulty of Paying Living Expenses: Not hard at all  Food Insecurity: No Food Insecurity (01/19/2023)   Hunger Vital Sign     Worried About Running Out of Food in the Last Year: Never true    Ran Out of Food in the Last Year: Never true  Transportation Needs: No Transportation Needs (01/19/2023)   PRAPARE - Administrator, Civil Service (Medical): No    Lack of Transportation (Non-Medical): No  Physical Activity: Insufficiently Active (01/19/2023)   Exercise Vital Sign    Days of Exercise per Week: 2 days    Minutes of Exercise per Session: 20 min  Stress: No Stress Concern Present (01/19/2023)   [employer] of Occupational Health - Occupational Stress Questionnaire    Feeling of Stress : Not at all  Social Connections: Moderately Integrated (01/19/2023)   Social Connection and Isolation Panel [NHANES]    Frequency of Communication with Friends and Family: More than three times a week    Frequency of Social Gatherings with Friends and Family: More than three times a week    Attends Religious Services: Never    Database administrator or Organizations: Yes    Attends Engineer, structural: More than 4 times per year    Marital Status: Married  Catering manager Violence: Not At Risk (01/19/2023)   Humiliation, Afraid, Rape, and Kick questionnaire    Fear of Current or Ex-Partner: No    Emotionally Abused: No    Physically Abused: No    Sexually Abused: No    Family History  Problem Relation Age of Onset   Asthma Son    Arthritis Neg Hx    Alcohol abuse Neg Hx    Birth defects Neg Hx    Cancer Neg Hx    COPD Neg Hx    Depression Neg Hx    Diabetes Neg Hx    Drug abuse Neg Hx    Early death Neg Hx    Hearing loss Neg Hx    Heart disease Neg Hx    Hyperlipidemia Neg Hx    Hypertension Neg Hx    Kidney disease Neg Hx    Learning disabilities Neg Hx    Mental illness Neg Hx    Mental retardation Neg Hx    Miscarriages / Stillbirths Neg Hx    Stroke Neg Hx    Vision loss Neg Hx     Past Surgical History:  Procedure Laterality Date   NO PAST SURGERIES     VIDEO  BRONCHOSCOPY Bilateral 04/17/2013   Procedure: VIDEO BRONCHOSCOPY WITHOUT FLUORO;  Surgeon: Denson Flake, MD;  Location: [facility];  Service: Cardiopulmonary;  Laterality: Bilateral;    ROS: Review of Systems Negative except as stated above  PHYSICAL EXAM: BP 114/76 (BP Location: Left Arm, Patient Position: Sitting, Cuff Size: Normal)   Pulse 82   Ht 5\' 4"  (1.626 m)   Wt 203 lb (92.1 kg)   SpO2 98%  BMI 34.84 kg/m   Physical Exam  General appearance - alert, well appearing, and in no distress Mental status - normal mood, behavior, speech, dress, motor activity, and thought processes Neck - supple, no significant adenopathy Chest - clear to auscultation, no wheezes, rales or rhonchi, symmetric air entry Heart - normal rate, regular rhythm, normal S1, S2, no murmurs, rubs, clicks or gallops Extremities - peripheral pulses normal, no pedal edema, no clubbing or cyanosis Skin - LT thumb: see pic below - Medical aspect of nail is avulsed with blood beneath.         Latest Ref Rng & Units 02/17/2023    5:01 PM 10/26/2022    5:59 PM 01/31/2022   10:02 AM  CMP  Glucose 70 - 99 mg/dL 161  91  096   BUN 6 - 24 mg/dL 15  11  11    Creatinine 0.57 - 1.00 mg/dL 0.45  4.09  8.11   Sodium 134 - 144 mmol/L 141  138  140   Potassium 3.5 - 5.2 mmol/L 4.2  3.7  4.0   Chloride 96 - 106 mmol/L 104  103  101   CO2 20 - 29 mmol/L 24  25  23    Calcium 8.7 - 10.2 mg/dL 9.6  9.4  9.4   Total Protein 6.0 - 8.5 g/dL   7.2   Total Bilirubin 0.0 - 1.2 mg/dL   <9.1   Alkaline Phos 44 - 121 IU/L   102   AST 0 - 40 IU/L   11   ALT 0 - 32 IU/L   13    Lipid Panel     Component Value Date/Time   CHOL 152 01/31/2022 1002   TRIG 68 01/31/2022 1002   HDL 54 01/31/2022 1002   CHOLHDL 2.8 01/31/2022 1002   CHOLHDL 2.9 05/15/2014 1037   VLDL 17 05/15/2014 1037   LDLCALC 85 01/31/2022 1002    CBC    Component Value Date/Time   WBC 8.9 02/17/2023 1701   WBC 10.4 10/26/2022 1759   RBC 4.75  02/17/2023 1701   RBC 4.43 10/26/2022 1759   HGB 9.6 (L) 02/17/2023 1701   HCT 32.9 (L) 02/17/2023 1701   PLT 454 (H) 02/17/2023 1701   MCV 69 (L) 02/17/2023 1701   MCH 20.2 (L) 02/17/2023 1701   MCH 22.8 (L) 10/26/2022 1759   MCHC 29.2 (L) 02/17/2023 1701   MCHC 30.7 10/26/2022 1759   RDW 20.7 (H) 02/17/2023 1701   LYMPHSABS 2.3 02/12/2015 1105   MONOABS 0.8 02/12/2015 1105   EOSABS 0.7 02/12/2015 1105   BASOSABS 0.1 02/12/2015 1105    ASSESSMENT AND PLAN: 1. Essential hypertension (Primary) At goal.  Diovan /hydrochlorothiazide  dose changed to a full tab daily to reflect how she has been taking - valsartan -hydrochlorothiazide  (DIOVAN -HCT) 80-12.5 MG tablet; Take 1 tablet by mouth daily.  Dispense: 90 tablet; Refill: 1  2. Iron deficiency anemia due to chronic blood loss Likely due to menstrual cycles.  She is asymptomatic.  Encouraged her to restart iron supplement and take daily.  Will give some MiraLAX to use and stool softener.  Also advised to increase fiber in the diet with things like green leafy vegetables. Other option is she does not take to take oral iron would be to refer to heme/onc for iron infusion.  Pt wanting blood cell count rechecked first to see if she still needs iron and if she, she prefers to get back on oral iron. - CBC  3. Drug-induced  constipation See #2 above - polyethylene glycol powder (GLYCOLAX/MIRALAX) 17 GM/SCOOP powder; Take 17 g by mouth daily as needed for mild constipation.  Dispense: 3350 g; Refill: 1  4. Family planning Pt not wanting to continue with Depo.  Wants Nexplanon which she states she was on in the past.  Not interested in IUD.  Message sent to Dr. Jennet Mode CMA to see if appt can be made with Dr. Newlin for Nexplanon placement. 5. Laceration of left thumb without foreign body with damage to nail, initial encounter Does not appear infected but the nail needs to come off.  Will refer her to hand specialist as soon as possible. I  recommended Tdapt today and pt was agreeable to this - Ambulatory referral to Hand Surgery  6. Need for Tdap vaccination Given today      Patient was given the opportunity to ask questions.  Patient verbalized understanding of the plan and was able to repeat key elements of the plan.   This documentation was completed using Paediatric nurse.  Any transcriptional errors are unintentional.  No orders of the defined types were placed in this encounter.    Requested Prescriptions    No prescriptions requested or ordered in this encounter    No follow-ups on file.  Concetta Dee, MD, FACP

## 2023-05-22 NOTE — Patient Instructions (Addendum)
 We will get you in with a hand specialist as soon as possible for your left thumb. We have given a tetanus vaccine today to help prevent you from getting tetanus due to the laceration of your thumb.  Use Ibuprofen  as needed for pain.  We will recheck your blood count today for the anemia.  I have prescribed MiraLAX for you to use as needed to help decrease the constipation.

## 2023-05-23 ENCOUNTER — Telehealth: Payer: Self-pay

## 2023-05-23 LAB — CBC
Hematocrit: 36.1 % (ref 34.0–46.6)
Hemoglobin: 10.6 g/dL — ABNORMAL LOW (ref 11.1–15.9)
MCH: 21.6 pg — ABNORMAL LOW (ref 26.6–33.0)
MCHC: 29.4 g/dL — ABNORMAL LOW (ref 31.5–35.7)
MCV: 74 fL — ABNORMAL LOW (ref 79–97)
Platelets: 459 10*3/uL — ABNORMAL HIGH (ref 150–450)
RBC: 4.9 x10E6/uL (ref 3.77–5.28)
RDW: 15.7 % — ABNORMAL HIGH (ref 11.7–15.4)
WBC: 9.5 10*3/uL (ref 3.4–10.8)

## 2023-05-23 NOTE — Telephone Encounter (Signed)
-----   Message from Concetta Dee sent at 05/22/2023 12:37 PM EDT ----- Regarding: Nexplanon placement Can we please schedule her an appointment with Dr. Newlin to have Nexplanon placed?  Thanks.

## 2023-05-23 NOTE — Telephone Encounter (Signed)
 Ok to schedule.

## 2023-05-23 NOTE — Telephone Encounter (Signed)
 Ok to place patient on schedule for nexplanon placement.

## 2023-05-25 NOTE — Telephone Encounter (Signed)
Patient was called and informed of appointment details.

## 2023-07-18 ENCOUNTER — Telehealth: Payer: Self-pay | Admitting: Internal Medicine

## 2023-07-18 NOTE — Telephone Encounter (Signed)
Contacted pt confirmed appt

## 2023-07-19 ENCOUNTER — Encounter: Payer: Self-pay | Admitting: Family Medicine

## 2023-07-19 ENCOUNTER — Other Ambulatory Visit: Payer: Self-pay

## 2023-07-19 ENCOUNTER — Ambulatory Visit: Attending: Family Medicine | Admitting: Family Medicine

## 2023-07-19 VITALS — BP 138/87 | HR 82 | Ht 64.0 in | Wt 205.2 lb

## 2023-07-19 DIAGNOSIS — I10 Essential (primary) hypertension: Secondary | ICD-10-CM

## 2023-07-19 DIAGNOSIS — Z3201 Encounter for pregnancy test, result positive: Secondary | ICD-10-CM | POA: Diagnosis not present

## 2023-07-19 DIAGNOSIS — Z331 Pregnant state, incidental: Secondary | ICD-10-CM

## 2023-07-19 DIAGNOSIS — O10011 Pre-existing essential hypertension complicating pregnancy, first trimester: Secondary | ICD-10-CM

## 2023-07-19 DIAGNOSIS — Z3491 Encounter for supervision of normal pregnancy, unspecified, first trimester: Secondary | ICD-10-CM

## 2023-07-19 LAB — POCT URINE PREGNANCY: Preg Test, Ur: POSITIVE — AB

## 2023-07-19 MED ORDER — LABETALOL HCL 200 MG PO TABS
100.0000 mg | ORAL_TABLET | Freq: Two times a day (BID) | ORAL | 1 refills | Status: DC
  Filled 2023-07-19: qty 90, 90d supply, fill #0

## 2023-07-19 NOTE — Progress Notes (Signed)
 Subjective:  Patient ID: Sarah Arnold, female    DOB: 1980-02-16  Age: 43 y.o. MRN: [id number]  CC: Possible Pregnancy     Discussed the use of AI scribe software for clinical note transcription with the patient, who gave verbal consent to proceed.  History of Present Illness Sarah Arnold is a 43 year old female with hypertension who presents with a positive pregnancy test.  Her last menstrual period was on  March 30, 2023, estimating her current gestational age to be approximately 14 weeks. This is her seventh pregnancy, and she has six children. She takes prenatal vitamins regularly.  She has hypertension, managed with valsartan  200 mg twice a day. She monitors her blood pressure at home, aiming to keep it below 140 mmHg. She has no ankle swelling and denies any pain. She is concerned about the safety of her current blood pressure medication during pregnancy.    Past Medical History:  Diagnosis Date   Abnormal Pap smear    Anemia    Late prenatal care    Pneumococcal pneumonia [facility])     Past Surgical History:  Procedure Laterality Date   NO PAST SURGERIES     VIDEO BRONCHOSCOPY Bilateral 04/17/2013   Procedure: VIDEO BRONCHOSCOPY WITHOUT FLUORO;  Surgeon: Lamar GORMAN Chris, MD;  Location: [facility];  Service: Cardiopulmonary;  Laterality: Bilateral;    Family History  Problem Relation Age of Onset   Asthma Son    Arthritis Neg Hx    Alcohol abuse Neg Hx    Birth defects Neg Hx    Cancer Neg Hx    COPD Neg Hx    Depression Neg Hx    Diabetes Neg Hx    Drug abuse Neg Hx    Early death Neg Hx    Hearing loss Neg Hx    Heart disease Neg Hx    Hyperlipidemia Neg Hx    Hypertension Neg Hx    Kidney disease Neg Hx    Learning disabilities Neg Hx    Mental illness Neg Hx    Mental retardation Neg Hx    Miscarriages / Stillbirths Neg Hx    Stroke Neg Hx    Vision loss Neg Hx     Social History   Socioeconomic History   Marital status: Married    Spouse  name: Not on file   Number of children: 6   Years of education: Not on file   Highest education level: Not on file  Occupational History   Occupation: unemployed  Tobacco Use   Smoking status: Never   Smokeless tobacco: Never  Vaping Use   Vaping status: Never Used  Substance and Sexual Activity   Alcohol use: No   Drug use: No   Sexual activity: Not Currently    Birth control/protection: Implant  Other Topics Concern   Not on file  Social History Narrative   Immigrated from [place] in 2001   No known TB exposures   Lives now in [place] in [place]   Social Drivers of Health   Financial Resource Strain: Low Risk  (01/19/2023)   Overall Financial Resource Strain (CARDIA)    Difficulty of Paying Living Expenses: Not hard at all  Food Insecurity: No Food Insecurity (01/19/2023)   Hunger Vital Sign    Worried About Running Out of Food in the Last Year: Never true    Ran Out of Food in the Last Year: Never true  Transportation Needs: No Transportation Needs (01/19/2023)   PRAPARE -  Administrator, Civil Service (Medical): No    Lack of Transportation (Non-Medical): No  Physical Activity: Insufficiently Active (01/19/2023)   Exercise Vital Sign    Days of Exercise per Week: 2 days    Minutes of Exercise per Session: 20 min  Stress: No Stress Concern Present (01/19/2023)   [employer] of Occupational Health - Occupational Stress Questionnaire    Feeling of Stress : Not at all  Social Connections: Moderately Integrated (01/19/2023)   Social Connection and Isolation Panel    Frequency of Communication with Friends and Family: More than three times a week    Frequency of Social Gatherings with Friends and Family: More than three times a week    Attends Religious Services: Never    Database administrator or Organizations: Yes    Attends Engineer, structural: More than 4 times per year    Marital Status: Married    Allergies  Allergen Reactions   Norvasc   [Amlodipine ]     Caused constipation    Outpatient Medications Prior to Visit  Medication Sig Dispense Refill   ferrous sulfate  325 (65 FE) MG tablet Take 1 tablet (325 mg total) by mouth 2 (two) times daily with a meal. 100 tablet 0   loratadine  (CLARITIN ) 10 MG tablet Take 1 tablet (10 mg total) by mouth daily as needed for allergies. 30 tablet 2   medroxyPROGESTERone (DEPO-PROVERA) 150 MG/ML injection Inject 150 mg into the muscle every 3 (three) months.     naproxen  (NAPROSYN ) 375 MG tablet Take 1 tablet (375 mg total) by mouth 2 (two) times daily. 20 tablet 0   polyethylene glycol powder (GLYCOLAX /MIRALAX ) 17 GM/SCOOP powder Take 17 g by mouth daily as needed for mild constipation. 3350 g 1   valsartan -hydrochlorothiazide  (DIOVAN -HCT) 80-12.5 MG tablet Take 1 tablet by mouth daily. 90 tablet 1   No facility-administered medications prior to visit.     ROS Review of Systems  Constitutional:  Negative for activity change and appetite change.  HENT:  Negative for sinus pressure and sore throat.   Respiratory:  Negative for chest tightness, shortness of breath and wheezing.   Cardiovascular:  Negative for chest pain and palpitations.  Gastrointestinal:  Negative for abdominal distention, abdominal pain and constipation.  Genitourinary: Negative.   Musculoskeletal: Negative.   Psychiatric/Behavioral:  Negative for behavioral problems and dysphoric mood.     Objective:  BP 138/87   Pulse 82   Ht 5' 4 (1.626 m)   Wt 205 lb 3.2 oz (93.1 kg)   SpO2 99%   BMI 35.22 kg/m      07/19/2023   11:21 AM 05/22/2023   10:18 AM 02/13/2023    9:54 AM  BP/Weight  Systolic BP 138 114 120  Diastolic BP 87 76 83  Wt. (Lbs) 205.2 203   BMI 35.22 kg/m2 34.84 kg/m2       Physical Exam Constitutional:      Appearance: She is well-developed.   Cardiovascular:     Rate and Rhythm: Normal rate.     Heart sounds: Normal heart sounds. No murmur heard. Pulmonary:     Effort: Pulmonary  effort is normal.     Breath sounds: Normal breath sounds. No wheezing or rales.  Chest:     Chest wall: No tenderness.  Abdominal:     General: Bowel sounds are normal. There is no distension.     Palpations: Abdomen is soft. There is no mass.     Tenderness:  There is no abdominal tenderness.   Musculoskeletal:        General: Normal range of motion.     Right lower leg: No edema.     Left lower leg: No edema.   Neurological:     Mental Status: She is alert and oriented to person, place, and time.   Psychiatric:        Mood and Affect: Mood normal.        Latest Ref Rng & Units 02/17/2023    5:01 PM 10/26/2022    5:59 PM 01/31/2022   10:02 AM  CMP  Glucose 70 - 99 mg/dL 892  91  894   BUN 6 - 24 mg/dL 15  11  11    Creatinine 0.57 - 1.00 mg/dL 8.95  8.96  9.09   Sodium 134 - 144 mmol/L 141  138  140   Potassium 3.5 - 5.2 mmol/L 4.2  3.7  4.0   Chloride 96 - 106 mmol/L 104  103  101   CO2 20 - 29 mmol/L 24  25  23    Calcium 8.7 - 10.2 mg/dL 9.6  9.4  9.4   Total Protein 6.0 - 8.5 g/dL   7.2   Total Bilirubin 0.0 - 1.2 mg/dL   <9.7   Alkaline Phos 44 - 121 IU/L   102   AST 0 - 40 IU/L   11   ALT 0 - 32 IU/L   13     Lipid Panel     Component Value Date/Time   CHOL 152 01/31/2022 1002   TRIG 68 01/31/2022 1002   HDL 54 01/31/2022 1002   CHOLHDL 2.8 01/31/2022 1002   CHOLHDL 2.9 05/15/2014 1037   VLDL 17 05/15/2014 1037   LDLCALC 85 01/31/2022 1002    CBC    Component Value Date/Time   WBC 9.5 05/22/2023 1109   WBC 10.4 10/26/2022 1759   RBC 4.90 05/22/2023 1109   RBC 4.43 10/26/2022 1759   HGB 10.6 (L) 05/22/2023 1109   HCT 36.1 05/22/2023 1109   PLT 459 (H) 05/22/2023 1109   MCV 74 (L) 05/22/2023 1109   MCH 21.6 (L) 05/22/2023 1109   MCH 22.8 (L) 10/26/2022 1759   MCHC 29.4 (L) 05/22/2023 1109   MCHC 30.7 10/26/2022 1759   RDW 15.7 (H) 05/22/2023 1109   LYMPHSABS 2.3 02/12/2015 1105   MONOABS 0.8 02/12/2015 1105   EOSABS 0.7 02/12/2015 1105    BASOSABS 0.1 02/12/2015 1105    Lab Results  Component Value Date   HGBA1C 5.90 05/15/2014       Assessment & Plan First trimester pregnancy Positive urine pregnancy test, estimated gestational age [redacted] weeks. Seventh pregnancy. Referral to [facility] for prenatal care required. - Refer to OB GYN for prenatal care. - Continue current prenatal vitamins.  Hypertension Chronic hypertension requires medication change due to pregnancy. Valsartan  contraindicated. Switch to labetalol for safe blood pressure control during pregnancy. Advised home monitoring and maintaining blood pressure below 140/90 mmHg. - Discontinue valsartan /HCTZ - Prescribe labetalol 200 mg twice a day. - Monitor blood pressure at home, maintain below 140/90 mmHg. - Contact clinic if blood pressure exceeds 140/90 mmHg. - Advise to watch salt intake.    Meds ordered this encounter  Medications   labetalol (NORMODYNE) 200 MG tablet    Sig: Take 0.5 tablets (100 mg total) by mouth 2 (two) times daily.    Dispense:  180 tablet    Refill:  1  Discontinue Valsartan /HCTZ    Follow-up: Return for Need healthcare, OB/GYN department will give you a call with an appointment..       Jesusa Stenerson, MD, FAAFP. [facility] [place], [place] [phone]   07/19/2023, 11:52 AM

## 2023-07-19 NOTE — Patient Instructions (Signed)
 High Blood Pressure During Pregnancy High blood pressure, or hypertension, is when the blood in your body moves with so much force that it could affect your health. It can cause problems for you and your baby. Three types of high blood pressure that can happen during pregnancy. They are: Chronic high blood pressure. This is when you've had high blood pressure for a while, even before getting pregnant. It doesn't go away after your baby is born. Gestational high blood pressure. This type starts after you're [redacted] weeks pregnant and usually goes away once your baby is born. Postpartum high blood pressure. This type is most common within 1 to 2 days after delivery, but it can also happen later -- even up to 12 weeks or more after pregnancy. It can be: High blood pressure that you had before your baby was born and continues after delivery. High blood pressure that starts after you've given birth. If your blood pressure gets really high, it's an emergency. You need to be treated right away. How does high blood pressure affect me? If you had blood pressure problems during pregnancy, you're more likely to get this condition after giving birth. High blood pressure can even happen 12 weeks or more after your baby is born. You may also: Get it when you are pregnant next time. Get it later in life. In some cases, this condition can cause serious problems, such as: Heart problems, such as stroke or heart attack. Injury to kidneys, lungs, or liver. Pre-eclampsia. HELLP syndrome. Seizures. Problems with the placenta. How does high blood pressure affect my baby? Your baby may: Be born early. Have a low birth weight. Have problems during labor. This may mean a C-section could be needed quickly. What are the risks for high blood pressure during pregnancy? You're more likely to get high blood pressure during pregnancy if: You had high blood pressure during a past pregnancy. You're overweight. You're 35 years  or older. You're pregnant for the first time. You're pregnant with more than one baby. You used a fertility method, such as IVF, to get pregnant. You have other problems, such as diabetes, kidney disease, or lupus. What can I do to lower my risk?  Keep a healthy weight. Eat a healthy diet. If you have long-term (chronic) conditions, have them treated before you become pregnant. How is this condition treated? Treatment depends on the type of high blood pressure you have and how serious it is. If you have high blood pressure, your health care provider may give you medicine to treat it and also to lessen risks to you and the baby. If you have very bad high blood pressure, you may need to stay in the hospital for treatment. If your condition gets worse, your baby may need to be born early. If you were taking medicine for your blood pressure before you got pregnant, talk with your provider. You may need to change the medicine during pregnancy if it is not safe for your baby. Follow these instructions at home: Eating and drinking Drink more fluids as told. Avoid caffeine. Lifestyle Do not use alcohol or drugs. Do not smoke, vape, or use nicotine or tobacco. Avoid stress as much as you can. Rest and get plenty of sleep. Get regular exercise. This can help to lower your blood pressure. Ask your health care provider what kinds of exercise are safe for you. General instructions Take your medicines only as told. Keep all follow-up visits. Your provider will check your blood pressure and  make sure that you and your baby are healthy. Contact a health care provider if: Your baby is not moving as much as usual. You feel very tired. You feel faint or dizzy. You throw up, or feel like you may throw up. You have cramping in your belly or have pain in your hips or lower back. You have spotting or bleeding, or you leak fluid from your vagina. Get help right away if: You have chest pain or trouble  breathing. You faint, have a seizure, or cannot think clearly. You have symptoms of serious problems, such as: A headache that doesn't go away when you take medicine. Very bad and sudden swelling of your face, hands, legs, or feet. Vision problems, such as: Seeing spots. Blurry vision. Being sensitive to light. These symptoms may be an emergency. Call 911 right away. Do not wait to see if the symptoms will go away. Do not drive yourself to the hospital. This information is not intended to replace advice given to you by your health care provider. Make sure you discuss any questions you have with your health care provider. Document Revised: 11/22/2022 Document Reviewed: 07/05/2022 Elsevier Patient Education  2024 [employer].

## 2023-07-24 ENCOUNTER — Ambulatory Visit: Payer: Self-pay | Admitting: Nurse Practitioner

## 2023-07-24 ENCOUNTER — Other Ambulatory Visit: Payer: Self-pay

## 2023-07-24 ENCOUNTER — Inpatient Hospital Stay (HOSPITAL_COMMUNITY)
Admission: AD | Admit: 2023-07-24 | Discharge: 2023-07-24 | Disposition: A | Discharge status: Home / Self Care | Attending: Obstetrics and Gynecology | Admitting: Obstetrics and Gynecology

## 2023-07-24 ENCOUNTER — Encounter (HOSPITAL_COMMUNITY): Payer: Self-pay | Admitting: *Deleted

## 2023-07-24 DIAGNOSIS — O10012 Pre-existing essential hypertension complicating pregnancy, second trimester: Secondary | ICD-10-CM

## 2023-07-24 DIAGNOSIS — O09522 Supervision of elderly multigravida, second trimester: Secondary | ICD-10-CM | POA: Diagnosis not present

## 2023-07-24 DIAGNOSIS — Z3A14 14 weeks gestation of pregnancy: Secondary | ICD-10-CM

## 2023-07-24 LAB — CBC
HCT: 34.7 % — ABNORMAL LOW (ref 36.0–46.0)
Hemoglobin: 10.9 g/dL — ABNORMAL LOW (ref 12.0–15.0)
MCH: 23.4 pg — ABNORMAL LOW (ref 26.0–34.0)
MCHC: 31.4 g/dL (ref 30.0–36.0)
MCV: 74.5 fL — ABNORMAL LOW (ref 80.0–100.0)
Platelets: 376 10*3/uL (ref 150–400)
RBC: 4.66 MIL/uL (ref 3.87–5.11)
RDW: 18.6 % — ABNORMAL HIGH (ref 11.5–15.5)
WBC: 9.4 10*3/uL (ref 4.0–10.5)
nRBC: 0 % (ref 0.0–0.2)

## 2023-07-24 LAB — COMPREHENSIVE METABOLIC PANEL WITH GFR
ALT: 20 U/L (ref 0–44)
AST: 19 U/L (ref 15–41)
Albumin: 3.4 g/dL — ABNORMAL LOW (ref 3.5–5.0)
Alkaline Phosphatase: 55 U/L (ref 38–126)
Anion gap: 11 (ref 5–15)
BUN: 7 mg/dL (ref 6–20)
CO2: 23 mmol/L (ref 22–32)
Calcium: 9.5 mg/dL (ref 8.9–10.3)
Chloride: 101 mmol/L (ref 98–111)
Creatinine, Ser: 0.8 mg/dL (ref 0.44–1.00)
GFR, Estimated: >60 mL/min (ref >60)
Glucose, Bld: 103 mg/dL — ABNORMAL HIGH (ref 70–99)
Potassium: 3.8 mmol/L (ref 3.5–5.1)
Sodium: 135 mmol/L (ref 135–145)
Total Bilirubin: 0.5 mg/dL (ref 0.0–1.2)
Total Protein: 7.5 g/dL (ref 6.5–8.1)

## 2023-07-24 LAB — URINALYSIS, ROUTINE W REFLEX MICROSCOPIC
Bilirubin Urine: NEGATIVE
Glucose, UA: NEGATIVE mg/dL
Hgb urine dipstick: NEGATIVE
Ketones, ur: NEGATIVE mg/dL
Leukocytes,Ua: NEGATIVE
Nitrite: NEGATIVE
Protein, ur: 30 mg/dL — AB
Specific Gravity, Urine: 1.028 (ref 1.005–1.030)
pH: 6 (ref 5.0–8.0)

## 2023-07-24 LAB — PROTEIN / CREATININE RATIO, URINE
Creatinine, Urine: 327 mg/dL
Protein Creatinine Ratio: 0.06 mg/mg{creat} (ref 0.00–0.15)
Total Protein, Urine: 20 mg/dL

## 2023-07-24 MED ORDER — ASPIRIN 81 MG PO TBEC: 81.0000 mg | DELAYED_RELEASE_TABLET | Freq: Every day | ORAL | 2 refills | Status: AC

## 2023-07-24 NOTE — Discharge Instructions (Signed)
 Follow up as scheduled for your prenatal appointments

## 2023-07-24 NOTE — MAU Note (Signed)
 Sarah Arnold is a 43 y.o. at Unknown here in MAU reporting: [facility].  Reports was seen there today. States she's unsure how far along she is, but greater than three months.  States her last period was in March.  Denies pain or VB.    LMP: March 2025 Onset of complaint: today Pain score: 0 Vitals:   07/24/23 1743  BP: 135/84  Pulse: 80  Resp: 18  Temp: 98.9 F (37.2 C)  SpO2: 100%     FHT: 160 bpm  Lab orders placed from triage: UA

## 2023-07-24 NOTE — MAU Provider Note (Signed)
 S [name] is a 43 y.o. [id number] patient who presents to MAU today with complaint of she is a cHTN ~ [redacted] weeks pregnant recently seen by her PCP on 07/19/23. Her antihypertensive at that time was changed to Labetalol  200 mg BID and she has been monitoring her BP's at home.   She went to establish Prenatal care at [facility] today  and was told to come here for Blood work due to her hypertension. Blood pressure here is normotensive and her antihypertensive was just changed 5 days ago. She offers no OB c/o . Denies any VB, LOF, abdominal pain or cramping and Fetal Heart Tones were 160 via doppler     O BP 135/84 (BP Location: Right Arm)   Pulse 80   Temp 98.9 F (37.2 C) (Oral)   Resp 18   Ht 5' 4 (1.626 m)   Wt 93.3 kg   LMP  (LMP Unknown)   SpO2 100%   BMI 35.31 kg/m  Physical Exam Vitals and nursing note reviewed.  Constitutional:      General: She is not in acute distress.    Appearance: Normal appearance. She is obese. She is not ill-appearing.  HENT:     Head: Normocephalic.     Nose: Nose normal.     Mouth/Throat:     Mouth: Mucous membranes are moist.   Cardiovascular:     Rate and Rhythm: Normal rate.  Pulmonary:     Effort: Pulmonary effort is normal.  Abdominal:     Palpations: Abdomen is soft.   Musculoskeletal:        General: Normal range of motion.     Cervical back: Normal range of motion.   Skin:    General: Skin is warm.   Neurological:     Mental Status: She is alert and oriented to person, place, and time.   Psychiatric:        Mood and Affect: Mood normal.        Behavior: Behavior normal.     MDM  Moderate   - Will obtain baseline labs for hypertension in pregnancy with PC ratio - Advised to start ASA daily  - [facility] and scheduled OB Ultrasound   Orders Placed This Encounter  Procedures   Urinalysis, Routine w reflex microscopic -Urine, Clean Catch    Standing Status:   Standing     Number of Occurrences:   1    Specimen Source:   Urine, Clean Catch [76]   CBC    Standing Status:   Standing    Number of Occurrences:   1   Comprehensive metabolic panel    Standing Status:   Standing    Number of Occurrences:   1   Protein / creatinine ratio, urine    Standing Status:   Standing    Number of Occurrences:   1   Discharge patient Discharge disposition: 01-Home or Self Care; Discharge patient date: 07/24/2023    Standing Status:   Standing    Number of Occurrences:   1    Discharge disposition:   01-Home or Self Care [1]    Discharge patient date:   07/24/2023     I have reviewed the patient chart and performed the physical exam . I have ordered & interpreted the lab results.  A/P as described below.  Counseling and education provided and patient agreeable  with plan as described below. Verbalized understanding.  ASSESSMENT Medical screening exam complete  1. Essential hypertension affecting pregnancy in second trimester (Primary)  2. [redacted] weeks gestation of pregnancy     PLAN  [facility] for [facility] Care   Discharge from MAU in stable condition  See AVS for full description of educational information and instructions provided to the patient at time of discharge  List of options for follow-up given   Warning signs for worsening condition that would warrant emergency follow-up discussed Patient may return to MAU as needed   Littie Olam LABOR, NP 07/24/2023 6:08 PM

## 2023-08-10 LAB — OB RESULTS CONSOLE GC/CHLAMYDIA
Chlamydia: NEGATIVE
Neisseria Gonorrhea: NEGATIVE

## 2023-08-10 LAB — HEPATITIS C ANTIBODY: HCV Ab: NEGATIVE

## 2023-08-10 LAB — OB RESULTS CONSOLE ANTIBODY SCREEN: Antibody Screen: NEGATIVE

## 2023-08-10 LAB — OB RESULTS CONSOLE ABO/RH: RH Type: POSITIVE

## 2023-08-10 LAB — URINE CULTURE: Urine Culture, OB: NEGATIVE

## 2023-08-10 LAB — OB RESULTS CONSOLE HIV ANTIBODY (ROUTINE TESTING): HIV: NONREACTIVE

## 2023-08-10 LAB — OB RESULTS CONSOLE VARICELLA ZOSTER ANTIBODY, IGG: Varicella: NON-IMMUNE/NOT IMMUNE

## 2023-08-10 LAB — OB RESULTS CONSOLE RUBELLA ANTIBODY, IGM: Rubella: IMMUNE

## 2023-08-10 LAB — OB RESULTS CONSOLE HEPATITIS B SURFACE ANTIGEN: Hepatitis B Surface Ag: NEGATIVE

## 2023-08-10 LAB — OB RESULTS CONSOLE RPR: RPR: NONREACTIVE

## 2023-08-14 ENCOUNTER — Other Ambulatory Visit: Payer: Self-pay

## 2023-08-14 DIAGNOSIS — O09522 Supervision of elderly multigravida, second trimester: Secondary | ICD-10-CM

## 2023-08-14 DIAGNOSIS — O24419 Gestational diabetes mellitus in pregnancy, unspecified control: Secondary | ICD-10-CM

## 2023-08-24 ENCOUNTER — Other Ambulatory Visit: Payer: Self-pay

## 2023-08-24 ENCOUNTER — Other Ambulatory Visit: Payer: Self-pay | Admitting: Internal Medicine

## 2023-08-24 ENCOUNTER — Telehealth: Payer: Self-pay

## 2023-08-24 DIAGNOSIS — I10 Essential (primary) hypertension: Secondary | ICD-10-CM

## 2023-08-24 NOTE — Telephone Encounter (Signed)
 Patient was supposed to be on labetalol  200 mg 1/2  a tablet twice a day.  Please confirm with pt  that she has been taking the full tablet twice a day.  If she has been doing that dose, I can prescribe it as 200 mg 1 tablet twice a day.  She was seen by MAU NP 07/24/23 and BP was 135/84 on that dose.

## 2023-08-24 NOTE — Telephone Encounter (Signed)
 Patient came in requesting refills on labetalol  (NORMODYNE ) 200 MG tablet states she only has 2 pills left. I advised patient that its showing 1+ refills remaining. Patient states she went to the pharmacy already and they told her she didn't have any and she needed a new order sent in.

## 2023-08-25 ENCOUNTER — Other Ambulatory Visit: Payer: Self-pay

## 2023-08-25 MED ORDER — LABETALOL HCL 200 MG PO TABS
200.0000 mg | ORAL_TABLET | Freq: Two times a day (BID) | ORAL | 1 refills | Status: DC
  Filled 2023-08-25 – 2023-08-28 (×2): qty 180, 90d supply, fill #0

## 2023-08-25 MED ORDER — LABETALOL HCL 200 MG PO TABS
100.0000 mg | ORAL_TABLET | Freq: Two times a day (BID) | ORAL | 1 refills | Status: DC
  Filled 2023-08-25: qty 180, 180d supply, fill #0

## 2023-08-25 NOTE — Telephone Encounter (Unsigned)
 Copied from CRM [id number]. Topic: Clinical - Prescription Issue >> Aug 25, 2023  3:33 PM Geneva B wrote: Reason for CRM: patient is calling in about her rx labetalol  (NORMODYNE ) 200 MG tablet patients says that her rx is gone pt was taking taking 4 tablets 2 times a day because she did not know please call patient back she wants to know if she can get a new rx [phone]

## 2023-08-25 NOTE — Telephone Encounter (Signed)
 Patient has spoken to Dr. Vicci and new prescription has been sent.  100 mg tablet ; 1 tablet in the am and 1 in the pm. Patient aware of voiced understanding.

## 2023-08-25 NOTE — Telephone Encounter (Signed)
 Patient aware of new prescription and voiced understanding*

## 2023-08-25 NOTE — Telephone Encounter (Signed)
 Phone call was placed to patient today to clarify the dose of the labetalol  that she has been taking.  She had seen Dr. Newlin in June and was placed on labetalol  200 mg half a tablet twice a day.  However patient states she did not understand the instructions on the bottle so she was taking 2 tablets in the morning and 2 tablets in the evening of the 200 mg.  I see a recent blood pressure reading that was done by MAU provider on 07/24/2023 and blood pressure at that time was 135/85 with pulse of 80. - Advised patient that I will send a refill on the medicine with a dosing for her to take 1 full tablet in the morning and 1 full tablet in the evening of the 200 mg.  Advised that in the future if she is confused about the instructions, she can ask the pharmacist or give us  a call.  She expressed understanding.

## 2023-08-25 NOTE — Addendum Note (Signed)
 Addended by: VICCI SOBER B on: 08/25/2023 05:19 PM   Modules accepted: Orders

## 2023-08-25 NOTE — Telephone Encounter (Signed)
 Unable to reach patient by phone to relay results.  Please confirm the with patient on how she takes the medication if she returns the call.

## 2023-08-28 ENCOUNTER — Other Ambulatory Visit: Payer: Self-pay

## 2023-08-28 ENCOUNTER — Encounter: Payer: Self-pay | Admitting: *Deleted

## 2023-08-28 DIAGNOSIS — O0990 Supervision of high risk pregnancy, unspecified, unspecified trimester: Secondary | ICD-10-CM | POA: Insufficient documentation

## 2023-08-28 DIAGNOSIS — O24419 Gestational diabetes mellitus in pregnancy, unspecified control: Secondary | ICD-10-CM | POA: Insufficient documentation

## 2023-08-28 DIAGNOSIS — O10919 Unspecified pre-existing hypertension complicating pregnancy, unspecified trimester: Secondary | ICD-10-CM | POA: Insufficient documentation

## 2023-08-30 ENCOUNTER — Other Ambulatory Visit: Payer: Self-pay

## 2023-08-30 DIAGNOSIS — O09529 Supervision of elderly multigravida, unspecified trimester: Secondary | ICD-10-CM | POA: Insufficient documentation

## 2023-08-30 DIAGNOSIS — O99210 Obesity complicating pregnancy, unspecified trimester: Secondary | ICD-10-CM | POA: Insufficient documentation

## 2023-08-30 DIAGNOSIS — O24419 Gestational diabetes mellitus in pregnancy, unspecified control: Secondary | ICD-10-CM

## 2023-08-31 NOTE — Progress Notes (Signed)
 Patient was seen for Gestational Diabetes on 09/07/2023   Start time [phone] and End time 1022   Estimated due date: 01/01/2024; [redacted]w[redacted]d  Clinical: Medications:  Current Outpatient Medications:    ferrous sulfate  325 (65 FE) MG tablet, Take 1 tablet (325 mg total) by mouth 2 (two) times daily with a meal., Disp: 100 tablet, Rfl: 0   Prenatal Vit-Fe Fumarate-FA (MULTIVITAMIN-PRENATAL) 27-0.8 MG TABS tablet, Take 1 tablet by mouth daily at 12 noon., Disp: , Rfl:    Accu-Chek Softclix Lancets lancets, Dx code 024.419 check glucose four times a day: Fasting, 2 hours after breakfast, 2 hours after lunch and 2 hours after dinner, Disp: 100 each, Rfl: 12   aspirin  EC 81 MG tablet, Take 1 tablet (81 mg total) by mouth daily. Take after 12 weeks for prevention of preeclampsia later in pregnancy, Disp: 300 tablet, Rfl: 2   Blood Glucose Monitoring Suppl (ACCU-CHEK GUIDE) w/Device KIT, 1 kit by Does not apply route in the morning, at noon, in the evening, and at bedtime., Disp: 1 kit, Rfl: 0   glucose blood test strip, Dx code 024.419 check glucose four times a day: Fasting, 2 hours after breakfast, 2 hours after lunch and 2 hours after dinner, Disp: 100 each, Rfl: 12   labetalol  (NORMODYNE ) 100 MG tablet, Take 1 tablet (100 mg total) by mouth 2 (two) times daily., Disp: 60 tablet, Rfl: 1   loratadine  (CLARITIN ) 10 MG tablet, Take 1 tablet (10 mg total) by mouth daily as needed for allergies. (Patient not taking: Reported on 09/06/2023), Disp: 30 tablet, Rfl: 2   polyethylene glycol powder (GLYCOLAX /MIRALAX ) 17 GM/SCOOP powder, Take 17 g by mouth daily as needed for mild constipation. (Patient not taking: Reported on 09/06/2023), Disp: 3350 g, Rfl: 1  Medical History:  Past Medical History:  Diagnosis Date   Abnormal Pap smear    Allergic rhinitis 04/10/2013   Anemia    Cough 02/11/2013   Gastritis 05/14/2013   Healthcare maintenance 02/12/2015   Hypertension    Late prenatal care    Multiple skin nodules  05/15/2014   Pneumococcal pneumonia (HCC)    Shortness of breath 02/11/2013    Labs:  Lab Results  Component Value Date   HGBA1C 6.3 (H) 09/06/2023    Dietary and Lifestyle History: Pt arrived to her appointment late and was offered an abbreviated appointment today which she accepted. Pt reports she was unable to pick up her blood sugar supplies before her appointment and states pharmacy does not have this prescription. RD spoke with pharmacy who states blood sugar testing prescriptions will be sent to [facility]. Pt reports she is home maker currently. Pt reports she is fasting today for her appointment. Pt was scheduled a follow up appointment for additional education and support.    Physical Activity: ADL's Stress: low Sleep: good  24 hr Recall:  First Meal:  skips or 1% milk lactose free or hominy, water Snack:  mango Second meal:  [place], cheese, 2 slices of white bread, water  Snack:  orange Third meal:  spinach, fufu made with corn, baked fish, water Snack:  none Beverages:  water, 1% lactose free milk  NUTRITION INTERVENTION  Nutrition education (E-1) on the following topics:   Initial Follow-up  [x]  []  Definition of Gestational Diabetes []  []  Why dietary management is important in controlling blood glucose []  []  Effects each nutrient has on blood glucose levels []  []  Simple carbohydrates vs complex carbohydrates [x]  []  Fluid intake [x]  []   Creating a balanced meal plan []  []  Carbohydrate counting  [x]  []  When to check blood glucose levels [x]  []  Proper blood glucose monitoring techniques []  []  Effect of stress and stress reduction techniques  []  []  Exercise effect on blood glucose levels, appropriate exercise during pregnancy []  []  Importance of limiting caffeine and abstaining from alcohol and smoking []  []  Medications used for blood sugar control during pregnancy [x]  []  Hypoglycemia and rule of 15 []  []  Postpartum self care  Patient has a meter  prior to visit. Patient is testing pre breakfast and 2 hours after each meal. Blood sugar in office today: 102 mg/dL, reported as fasting per Pt    Patient instructed to monitor glucose levels: QID FBS: 60 - <= 95 mg/dL; 2 hour: <= 879 mg/dL  Patient received handouts: Nutrition Diabetes and Pregnancy Carbohydrate Counting List Blood glucose log Snack ideas for diabetes during pregnancy Plate Planner  Patient will be seen for follow-up: 09/21/2023

## 2023-09-04 DIAGNOSIS — Z148 Genetic carrier of other disease: Secondary | ICD-10-CM | POA: Insufficient documentation

## 2023-09-04 DIAGNOSIS — D563 Thalassemia minor: Secondary | ICD-10-CM | POA: Insufficient documentation

## 2023-09-06 ENCOUNTER — Encounter: Payer: Self-pay | Admitting: Obstetrics and Gynecology

## 2023-09-06 ENCOUNTER — Ambulatory Visit: Admitting: Obstetrics and Gynecology

## 2023-09-06 ENCOUNTER — Other Ambulatory Visit: Payer: Self-pay

## 2023-09-06 VITALS — BP 105/58 | HR 91 | Wt 203.0 lb

## 2023-09-06 DIAGNOSIS — Z1332 Encounter for screening for maternal depression: Secondary | ICD-10-CM

## 2023-09-06 DIAGNOSIS — Z148 Genetic carrier of other disease: Secondary | ICD-10-CM | POA: Diagnosis not present

## 2023-09-06 DIAGNOSIS — D869 Sarcoidosis, unspecified: Secondary | ICD-10-CM

## 2023-09-06 DIAGNOSIS — O0932 Supervision of pregnancy with insufficient antenatal care, second trimester: Secondary | ICD-10-CM | POA: Diagnosis not present

## 2023-09-06 DIAGNOSIS — O0990 Supervision of high risk pregnancy, unspecified, unspecified trimester: Secondary | ICD-10-CM

## 2023-09-06 DIAGNOSIS — D563 Thalassemia minor: Secondary | ICD-10-CM

## 2023-09-06 DIAGNOSIS — A310 Pulmonary mycobacterial infection: Secondary | ICD-10-CM

## 2023-09-06 DIAGNOSIS — Z3A23 23 weeks gestation of pregnancy: Secondary | ICD-10-CM

## 2023-09-06 DIAGNOSIS — O09529 Supervision of elderly multigravida, unspecified trimester: Secondary | ICD-10-CM

## 2023-09-06 DIAGNOSIS — O0992 Supervision of high risk pregnancy, unspecified, second trimester: Secondary | ICD-10-CM

## 2023-09-06 DIAGNOSIS — O24419 Gestational diabetes mellitus in pregnancy, unspecified control: Secondary | ICD-10-CM

## 2023-09-06 DIAGNOSIS — O10919 Unspecified pre-existing hypertension complicating pregnancy, unspecified trimester: Secondary | ICD-10-CM

## 2023-09-06 DIAGNOSIS — O10912 Unspecified pre-existing hypertension complicating pregnancy, second trimester: Secondary | ICD-10-CM

## 2023-09-06 DIAGNOSIS — O09522 Supervision of elderly multigravida, second trimester: Secondary | ICD-10-CM

## 2023-09-06 MED ORDER — ACCU-CHEK SOFTCLIX LANCETS MISC
12 refills | Status: AC
Start: 2023-09-06 — End: ?

## 2023-09-06 MED ORDER — LABETALOL HCL 100 MG PO TABS: 100.0000 mg | ORAL_TABLET | Freq: Two times a day (BID) | ORAL | 1 refills | Status: AC

## 2023-09-06 MED ORDER — GLUCOSE BLOOD VI STRP: ORAL_STRIP | 12 refills | Status: AC

## 2023-09-06 MED ORDER — ACCU-CHEK GUIDE W/DEVICE KIT: 1.0000 | PACK | Freq: Four times a day (QID) | 0 refills | Status: AC

## 2023-09-06 NOTE — Progress Notes (Signed)
 PRENATAL VISIT NOTE  [facility] transfer of care for new GDM.  Subjective:  Sarah Arnold is a 43 y.o. G7P6006 at [redacted]w[redacted]d being seen today for ongoing prenatal care.  She is currently monitored for the following issues for this high-risk pregnancy and has MAIC (mycobacterium avium-intracellulare complex) (HCC); Sarcoidosis; Class 2 severe obesity with serious comorbidity and body mass index (BMI) of 35.0 to 35.9 in adult [facility]); Supervision of high risk pregnancy, antepartum; Pre-existing hypertension affecting pregnancy; GDM (gestational diabetes mellitus); AMA (advanced maternal age) multigravida 35+; Obesity affecting pregnancy, antepartum; Alpha thalassemia silent carrier; Increased Carrier risk for spinal muscular atrophy; and Late prenatal care affecting pregnancy in second trimester on their problem list.  Patient reports no complaints.  Contractions: Not present. Vag. Bleeding: None.  Movement: Present. Denies leaking of fluid.   The following portions of the patient's history were reviewed and updated as appropriate: allergies, current medications, past family history, past medical history, past social history, past surgical history and problem list.   Objective:    Vitals:   09/06/23 0939  BP: (!) 105/58  Pulse: 91  Weight: 203 lb (92.1 kg)    Fetal Status:  Fetal Heart Rate (bpm): 147   Movement: Present    General: Alert, oriented and cooperative. Patient is in no acute distress.  Skin: Skin is warm and dry. No rash noted.   Cardiovascular: Normal heart rate noted  Respiratory: Normal respiratory effort, no problems with respiration noted  Abdomen: Soft, gravid, appropriate for gestational age.  Pain/Pressure: Absent     Pelvic: Cervical exam deferred        Extremities: Normal range of motion.  Edema: None  Mental Status: Normal mood and affect. Normal behavior. Normal judgment and thought content.  Normal s1 and s2, no MRGs CTAB, no respiratory distress  Assessment and  Plan:  Pregnancy: G7P6006 at [redacted]w[redacted]d 1. Alpha thalassemia silent carrier (Primary)  2. Increased Carrier risk for spinal muscular atrophy  3. Supervision of high risk pregnancy, antepartum Has mfm anatomy u/s later this week. Pt confirms on low dose ASA Discuss with her next visit re: birth control - Prenatal Vit-Fe Fumarate-FA (MULTIVITAMIN-PRENATAL) 27-0.8 MG TABS tablet; Take 1 tablet by mouth daily at 12 noon. - Accu-Chek Softclix Lancets lancets; Dx code 024.419 check glucose four times a day: Fasting, 2 hours after breakfast, 2 hours after lunch and 2 hours after dinner  Dispense: 100 each; Refill: 12 - Blood Glucose Monitoring Suppl (ACCU-CHEK GUIDE) w/Device KIT; 1 kit by Does not apply route in the morning, at noon, in the evening, and at bedtime.  Dispense: 1 kit; Refill: 0 - glucose blood test strip; Dx code 024.419 check glucose four times a day: Fasting, 2 hours after breakfast, 2 hours after lunch and 2 hours after dinner  Dispense: 100 each; Refill: 12 - Angiotensin converting enzyme - Ambulatory referral to Pulmonology - Anemia Profile B - Hemoglobin A1c - labetalol  (NORMODYNE ) 100 MG tablet; Take 1 tablet (100 mg total) by mouth 2 (two) times daily.  Dispense: 60 tablet; Refill: 1  4. Late prenatal care affecting pregnancy in second trimester  5. Sarcoidosis Follow up MFM visit and referral made to [facility]. Last seen by [facility] June 2017, Dr. Dante. Plan at that time was to RTC in 6 months with prior CT chest stable. Pending May 2017 myocobacteria culture and afb negative.  - Ambulatory referral to Pulmonology  6. MAIC (mycobacterium avium-intracellulare complex) (HCC) See above - Ambulatory referral to Pulmonology  7.  [redacted] weeks gestation of pregnancy - Prenatal Vit-Fe Fumarate-FA (MULTIVITAMIN-PRENATAL) 27-0.8 MG TABS tablet; Take 1 tablet by mouth daily at 12 noon. - Accu-Chek Softclix Lancets lancets; Dx code 024.419 check glucose four times a day: Fasting,  2 hours after breakfast, 2 hours after lunch and 2 hours after dinner  Dispense: 100 each; Refill: 12 - Blood Glucose Monitoring Suppl (ACCU-CHEK GUIDE) w/Device KIT; 1 kit by Does not apply route in the morning, at noon, in the evening, and at bedtime.  Dispense: 1 kit; Refill: 0 - glucose blood test strip; Dx code 024.419 check glucose four times a day: Fasting, 2 hours after breakfast, 2 hours after lunch and 2 hours after dinner  Dispense: 100 each; Refill: 12 - Angiotensin converting enzyme - Ambulatory referral to Pulmonology - Anemia Profile B - Hemoglobin A1c - labetalol  (NORMODYNE ) 100 MG tablet; Take 1 tablet (100 mg total) by mouth 2 (two) times daily.  Dispense: 60 tablet; Refill: 1 - TSH Rfx on Abnormal to Free T4  8. Gestational diabetes mellitus (GDM), antepartum, gestational diabetes method of control unspecified No prior h/o. Follow up GDM class tomorrow  9. Pre-existing hypertension affecting pregnancy, antepartum Pt confirms on labealol 200 bid. Recommend going down to 100 bid.   10. Antepartum multigravida of advanced maternal age Low risk panorama.  Preterm labor symptoms and general obstetric precautions including but not limited to vaginal bleeding, contractions, leaking of fluid and fetal movement were reviewed in detail with the patient. Please refer to After Visit Summary for other counseling recommendations.   Return in about 10 days (around 09/16/2023) for in person, md visit, high risk ob.  Future Appointments  Date Time Provider Department Center  09/07/2023  9:15 AM [facility]  09/08/2023  1:00 PM [facility] GENETIC COUNSELING RM [facility]  09/08/2023  2:00 PM [facility] PROVIDER 1 [facility]  09/08/2023  2:30 PM [facility] US6 [facility]  10/04/2023  8:55 AM Anyanwu, Gloris LABOR, MD [facility]  10/26/2023  8:15 AM Izell Harari, MD [facility]  11/09/2023 10:55 AM [facility] 1 [facility]    Harari Izell, MD

## 2023-09-07 ENCOUNTER — Ambulatory Visit: Admitting: Dietician

## 2023-09-07 ENCOUNTER — Encounter: Attending: Obstetrics and Gynecology | Admitting: Dietician

## 2023-09-07 ENCOUNTER — Other Ambulatory Visit: Payer: Self-pay

## 2023-09-07 DIAGNOSIS — O24419 Gestational diabetes mellitus in pregnancy, unspecified control: Secondary | ICD-10-CM

## 2023-09-07 DIAGNOSIS — Z713 Dietary counseling and surveillance: Secondary | ICD-10-CM | POA: Insufficient documentation

## 2023-09-07 DIAGNOSIS — Z3A23 23 weeks gestation of pregnancy: Secondary | ICD-10-CM

## 2023-09-07 DIAGNOSIS — Z3A21 21 weeks gestation of pregnancy: Secondary | ICD-10-CM | POA: Insufficient documentation

## 2023-09-07 LAB — ANEMIA PROFILE B
Basophils Absolute: 0 x10E3/uL (ref 0.0–0.2)
Basos: 0 %
EOS (ABSOLUTE): 0.5 x10E3/uL — ABNORMAL HIGH (ref 0.0–0.4)
Eos: 5 %
Ferritin: 43 ng/mL (ref 15–150)
Folate: >20 ng/mL (ref >3.0)
Hematocrit: 33.6 % — ABNORMAL LOW (ref 34.0–46.6)
Hemoglobin: 10.2 g/dL — ABNORMAL LOW (ref 11.1–15.9)
Immature Grans (Abs): 0 x10E3/uL (ref 0.0–0.1)
Immature Granulocytes: 0 %
Iron Saturation: 10 % — ABNORMAL LOW (ref 15–55)
Iron: 41 ug/dL (ref 27–159)
Lymphocytes Absolute: 2.1 x10E3/uL (ref 0.7–3.1)
Lymphs: 18 %
MCH: 23.7 pg — ABNORMAL LOW (ref 26.6–33.0)
MCHC: 30.4 g/dL — ABNORMAL LOW (ref 31.5–35.7)
MCV: 78 fL — ABNORMAL LOW (ref 79–97)
Monocytes Absolute: 1 x10E3/uL — ABNORMAL HIGH (ref 0.1–0.9)
Monocytes: 9 %
Neutrophils Absolute: 7.9 x10E3/uL — ABNORMAL HIGH (ref 1.4–7.0)
Neutrophils: 68 %
Platelets: 383 x10E3/uL (ref 150–450)
RBC: 4.3 x10E6/uL (ref 3.77–5.28)
RDW: 17.1 % — ABNORMAL HIGH (ref 11.7–15.4)
Retic Ct Pct: 1.9 % (ref 0.6–2.6)
Total Iron Binding Capacity: 411 ug/dL (ref 250–450)
UIBC: 370 ug/dL (ref 131–425)
Vitamin B-12: 603 pg/mL (ref 232–1245)
WBC: 11.5 x10E3/uL — ABNORMAL HIGH (ref 3.4–10.8)

## 2023-09-07 LAB — HEMOGLOBIN A1C
Est. average glucose Bld gHb Est-mCnc: 134 mg/dL
Hgb A1c MFr Bld: 6.3 % — ABNORMAL HIGH (ref 4.8–5.6)

## 2023-09-07 LAB — TSH RFX ON ABNORMAL TO FREE T4: TSH: 1.85 u[IU]/mL (ref 0.450–4.500)

## 2023-09-07 LAB — ANGIOTENSIN CONVERTING ENZYME: Angio Convert Enzyme: 55 U/L (ref 14–82)

## 2023-09-07 NOTE — Progress Notes (Signed)
 Patient was seen for Gestational Diabetes on 09/20/2024  Start time 0826 and End time 0856  Estimated due date: 02/08/2024;[redacted]w[redacted]d  Clinical: Medications:  Current Outpatient Medications:    Accu-Chek Softclix Lancets lancets, Dx code 024.419 check glucose four times a day: Fasting, 2 hours after breakfast, 2 hours after lunch and 2 hours after dinner, Disp: 100 each, Rfl: 12   aspirin  EC 81 MG tablet, Take 1 tablet (81 mg total) by mouth daily. Take after 12 weeks for prevention of preeclampsia later in pregnancy, Disp: 300 tablet, Rfl: 2   Blood Glucose Monitoring Suppl (ACCU-CHEK GUIDE) w/Device KIT, 1 kit by Does not apply route in the morning, at noon, in the evening, and at bedtime., Disp: 1 kit, Rfl: 0   ferrous sulfate  325 (65 FE) MG tablet, Take 1 tablet (325 mg total) by mouth 2 (two) times daily with a meal., Disp: 100 tablet, Rfl: 0   glucose blood test strip, Dx code 024.419 check glucose four times a day: Fasting, 2 hours after breakfast, 2 hours after lunch and 2 hours after dinner, Disp: 100 each, Rfl: 12   labetalol  (NORMODYNE ) 100 MG tablet, Take 1 tablet (100 mg total) by mouth 2 (two) times daily., Disp: 60 tablet, Rfl: 1   loratadine  (CLARITIN ) 10 MG tablet, Take 1 tablet (10 mg total) by mouth daily as needed for allergies. (Patient not taking: Reported on 09/19/2023), Disp: 30 tablet, Rfl: 2   polyethylene glycol powder (GLYCOLAX /MIRALAX ) 17 GM/SCOOP powder, Take 17 g by mouth daily as needed for mild constipation. (Patient not taking: Reported on 09/19/2023), Disp: 3350 g, Rfl: 1   Prenatal Vit-Fe Fumarate-FA (MULTIVITAMIN-PRENATAL) 27-0.8 MG TABS tablet, Take 1 tablet by mouth daily at 12 noon., Disp: , Rfl:   Medical History:  Past Medical History:  Diagnosis Date   Abnormal Pap smear    Allergic rhinitis 04/10/2013   Anemia    Cough 02/11/2013   Gastritis 05/14/2013   Healthcare maintenance 02/12/2015   Hypertension    Late prenatal care    Multiple skin nodules  05/15/2014   Pneumococcal pneumonia (HCC)    Shortness of breath 02/11/2013    Labs:  Lab Results  Component Value Date   HGBA1C 6.3 (H) 09/06/2023    Dietary and Lifestyle History:  Pt presents today alone with her blood sugar log sheet for follow up. Pt reports she is walking occasionally. Pt performed a fasting blood sugar in office today with a value of 119 mg/dL. Pt reports she had milk around 4 am last night stating she became hungry. RD encouraged a balanced snacks after testing blood sugar including a bedtime snack was encouraged. Pt reports she has all the supplies she needs to continue to test blood sugar QID. All Pt's questions were answered during this encounter.    Physical Activity: walking occasionally Stress: low Sleep: good  24 hr Recall:  First Meal: bread, milk (122 mg/dL reported as 2 hour post prandial)  Snack:  mango Second meal: beans, rice, chicken, salad, water (121 mg/dL reported as 2 hour post prandial per Pt)  Snack: berries Third meal:  chicken, rice, carrots, milk (120 mg/dL, reported as 2 hour post prandial per Pt) Snack:  none Beverages:  water, 1% lactose free milk  NUTRITION INTERVENTION  Nutrition education (E-1) on the following topics:   Initial Follow-up  [x]  []  Definition of Gestational Diabetes []  [x]  Why dietary management is important in controlling blood glucose []  [x]  Effects each nutrient has on blood glucose  levels []  []  Simple carbohydrates vs complex carbohydrates [x]  []  Fluid intake [x]  [x]  Creating a balanced meal plan []  []  Carbohydrate counting  [x]  [x]  When to check blood glucose levels [x]  []  Proper blood glucose monitoring techniques []  [x]  Effect of stress and stress reduction techniques  []  [x]  Exercise effect on blood glucose levels, appropriate exercise during pregnancy []  [x]  Importance of limiting caffeine and abstaining from alcohol and smoking []  [x]  Medications used for blood sugar control during  pregnancy [x]  [x]  Hypoglycemia and rule of 15 []  [x]  Postpartum self care  Patient has a meter prior to visit. Patient is testing pre breakfast and 2 hours after each meal.    Patient instructed to monitor glucose levels: QID FBS: 60 - <= 95 mg/dL; 2 hour: <= 879 mg/dL  Patient received handouts: Blood glucose log Snack ideas for diabetes during pregnancy   Patient will be seen for follow-up: 11/23/2023

## 2023-09-08 ENCOUNTER — Other Ambulatory Visit: Payer: Self-pay | Admitting: *Deleted

## 2023-09-08 ENCOUNTER — Other Ambulatory Visit

## 2023-09-08 ENCOUNTER — Ambulatory Visit

## 2023-09-08 ENCOUNTER — Ambulatory Visit: Admitting: Obstetrics and Gynecology

## 2023-09-08 ENCOUNTER — Ambulatory Visit (HOSPITAL_BASED_OUTPATIENT_CLINIC_OR_DEPARTMENT_OTHER)

## 2023-09-08 VITALS — BP 129/71 | HR 89

## 2023-09-08 DIAGNOSIS — O0932 Supervision of pregnancy with insufficient antenatal care, second trimester: Secondary | ICD-10-CM

## 2023-09-08 DIAGNOSIS — E669 Obesity, unspecified: Secondary | ICD-10-CM | POA: Diagnosis not present

## 2023-09-08 DIAGNOSIS — O99212 Obesity complicating pregnancy, second trimester: Secondary | ICD-10-CM | POA: Diagnosis not present

## 2023-09-08 DIAGNOSIS — Z148 Genetic carrier of other disease: Secondary | ICD-10-CM | POA: Insufficient documentation

## 2023-09-08 DIAGNOSIS — O10912 Unspecified pre-existing hypertension complicating pregnancy, second trimester: Secondary | ICD-10-CM | POA: Insufficient documentation

## 2023-09-08 DIAGNOSIS — O24419 Gestational diabetes mellitus in pregnancy, unspecified control: Secondary | ICD-10-CM

## 2023-09-08 DIAGNOSIS — O10012 Pre-existing essential hypertension complicating pregnancy, second trimester: Secondary | ICD-10-CM

## 2023-09-08 DIAGNOSIS — O09522 Supervision of elderly multigravida, second trimester: Secondary | ICD-10-CM | POA: Insufficient documentation

## 2023-09-08 DIAGNOSIS — O10919 Unspecified pre-existing hypertension complicating pregnancy, unspecified trimester: Secondary | ICD-10-CM

## 2023-09-08 DIAGNOSIS — D563 Thalassemia minor: Secondary | ICD-10-CM

## 2023-09-08 DIAGNOSIS — O0942 Supervision of pregnancy with grand multiparity, second trimester: Secondary | ICD-10-CM | POA: Diagnosis not present

## 2023-09-08 DIAGNOSIS — Z3A18 18 weeks gestation of pregnancy: Secondary | ICD-10-CM

## 2023-09-08 DIAGNOSIS — Z3A23 23 weeks gestation of pregnancy: Secondary | ICD-10-CM

## 2023-09-08 DIAGNOSIS — Z362 Encounter for other antenatal screening follow-up: Secondary | ICD-10-CM

## 2023-09-08 DIAGNOSIS — O99012 Anemia complicating pregnancy, second trimester: Secondary | ICD-10-CM

## 2023-09-08 DIAGNOSIS — O09529 Supervision of elderly multigravida, unspecified trimester: Secondary | ICD-10-CM

## 2023-09-08 DIAGNOSIS — O99210 Obesity complicating pregnancy, unspecified trimester: Secondary | ICD-10-CM

## 2023-09-08 DIAGNOSIS — O0990 Supervision of high risk pregnancy, unspecified, unspecified trimester: Secondary | ICD-10-CM

## 2023-09-08 NOTE — Progress Notes (Signed)
 [facility] [address], [address] Phone:  [phone]   Fax:  [phone]      In-Person Genetic Counseling Clinic Note:   I spoke with 43 y.o. Sarah Arnold today to discuss her carrier screening results. She was referred by Sarah Maiden, FNP. She was accompanied by her sister.   Pregnancy History:    [id number]. EGA: [redacted]w[redacted]d by US . EDD: 02/08/2024. Patient reports 6 healthy children. Denies major personal health concerns. Reports a history of HTN. Denies bleeding, infections, and fevers in this pregnancy. Denies using tobacco, alcohol, or street drugs in this pregnancy.   Family History:    A three-generation pedigree was created and scanned into Epic under the Media tab.  Maternal ethnicity reported as African and paternal ethnicity reported as African. Denies Ashkenazi Jewish ancestry.  Family history not remarkable for consanguinity, individuals with birth defects, intellectual disability, autism spectrum disorder, multiple spontaneous abortions, still births, or unexplained neonatal death.   Maternal Alpha Thalassemia Silent Carrier and Increased Risk to be a Carrier for Spinal Muscular Atrophy:   We reviewed the genetics of alpha thalassemia and spinal muscular atrophy, autosomal recessive mode of inheritance, and clinical features of these conditions. Of note, Sarah Arnold was not found to be a carrier for the other two conditions screened for (beta-hemoglobinopathies and cystic fibrosis). This significantly reduces but does not eliminate the chance of being a carrier. Please see report for details.    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 Please see report for details.    We reviewed that Sarah Arnold will either pass down two copies of the alpha globin gene (??) OR one copy of the alpha globin gene (-?) in  each pregnancy. Therefore, this pregnancy is not at increased risk for hemoglobin Bart's due to four deletions of the alpha globin genes (--/--) regardless of her reproductive partner's carrier status. The pregnancy will be at increased risk (25%) for hemoglobin H disease if her partner is an alpha thalassemia carrier in the cis configuration (--/??). We reviewed that this is more common in [place] Asian populations and less common in those with Black ancestry.  Spinal Muscular Atrophy (SMA): Sarah Arnold was found to be at an increased risk to be a silent carrier for SMA through carrier screening. Sarah Arnold screened to have two functional copies of the SMN1 gene; however, due to limitations of genetic screening the laboratory cannot confirm if Sarah Arnold's two functional copies are on the same chromosome or on opposite chromosomes. The laboratory identified the variant [id number] in Sarah Arnold's screening, meaning there is increased risk for Sarah Arnold's two copies of the SMN1 gene to be on the same chromosome. Specifically, given Sarah Arnold's ethnic background, her risk to be a silent carrier is approximately 1 in 34 (~3%). Please see report for details.   We reviewed that if Sarah Arnold was a silent SMA carrier, she would either pass down the chromosome with both copies of the SMN1 gene OR the chromosome with the deletions. If Sarah Arnold's reproductive partner is found to also be a carrier for SMA, there would be a 25% risk for the current pregnancy and all future pregnancies the couple has together to be affected (have zero functional copies of the SMN1 gene).   FOB carrier screening offered: Given these results, we discussed and offered carrier screening for Sarah Arnold's reproductive partner Sarah Arnold. We reviewed the benefits  and limitations of carrier screening and that it can detect most but not all carriers. The couple consented alpha thalassemia and SMA carrier screening for FOB. Patient declined carrier  screening for FOB. We reviewed other options such as amniocentesis or postnatal testing of the newborn. The technical aspects, benefits, risks, and limitations of amniocentesis including the 1 in 500 risk for miscarriage. She elected waiting for the NBS results. We reviewed that NBS will not detect alpha thalassemia but additional testing can be performed for the newborn as clinically indicated.   In the meantime, we calculated the chance that this couple's pregnancy would be affected with SMA based on Sarah Arnold's screening results and FOB's ethnicity. This would be a 1 in 8,976 (~0.01%) chance that this couple's pregnancy would be affected. Carrier screening for FOB will allow us  to provide more accurate risk.  Newborn Screening. The Sarah Arnold  Newborn Screening (NBS) program will screen all newborn babies for cystic fibrosis, spinal muscular atrophy, hemoglobinopathies, and numerous other conditions.   Advanced Maternal Age:  We briefly discussed that the chance that a fetus would be affected with a chromosome difference increases with advanced maternal age. Sarah Arnold's current age-related risk to have a pregnancy affected with a chromosome difference is approximately 1 in 39 (~2.6%). We discussed that this risk may also be lower given her low-risk NIPS results. We reviewed amniocentesis is the only method to determine the fetal chromosomes prenatally.  Advanced Paternal Age:  We reviewed the current literature surrounding advanced paternal age, such as the association with an increased risk for several fetal health conditions. There is known to be an increase risk in single gene conditions in the children of males who are over age 49-45, though the specific age varies in different studies. This chance is estimated to be no greater than 2%. These mutations may result in birth defects or genetic syndromes which may show differences on ultrasound in the second trimester. Therefore, we recommend a  detailed anatomy ultrasound at approximately 18-[redacted] weeks gestation; however, a normal ultrasound cannot rule out these disorders. Advanced paternal age has also been associated with other conditions including autism spectrum disorders, schizophrenia and childhood acute lymphoblastic leukemia. It is important to be aware of these associations, though no recommended clinical testing is available for these conditions at this time. We discussed the option of [facility] single gene NIPS. We reviewed that this screens for 25 autosomal dominant and X-linked conditions. We reviewed that it is not diagnostic, and confirmatory testing either pre- or postnatally would be recommended in the event of a positive screening result. Moreover, negative screening does not rule out these conditions. We also discussed the possibility of this screen detecting a genetic condition in the patient. She declined additional screening.  Previous Testing Completed:  Low risk NIPS: Randalyn previously completed noninvasive prenatal screening (NIPS) in this pregnancy. The result is low risk, consistent with a female fetus. This screening significantly reduces but does not eliminate the chance that the current pregnancy has Down syndrome (trisomy 64), trisomy 69, trisomy 12, common sex chromosome conditions, and 22q11.2 microdeletion syndrome. Please see report for details. There are many genetic conditions that cannot be detected by NIPS.   Negative ms-AFP screening: Rex previously completed a maternal serum AFP screen in this pregnancy. The result is screen negative. Please see report for details. A negative result reduces the risk that the current pregnancy has an open neural tube defect. Closed neural tube defects and some open defects may not be detected by this  screen.   Plan of Care:   Declined carrier screening for FOB. Declined amniocentesis. Declined [facility] single gene NIPS. Newborn screening of the newborn. Routine  prenatal care.   Informed consent was obtained. All questions were answered.   45 minutes were spent on the date of the encounter in service to the patient including preparation, face-to-face consultation, discussion of test reports and available next steps, pedigree construction, genetic risk assessment, documentation, and care coordination.    Thank you for sharing in the care of Kylani with us .  Please do not hesitate to contact us  at [phone] if you have any questions.   Lauraine Bodily, MS, [facility] Certified Genetic Counselor   Genetic counseling student involved in appointment: No.

## 2023-09-08 NOTE — Progress Notes (Signed)
 Maternal-Fetal Medicine Consultation Name: Sarah Arnold MRN: [id number]  G7 [id number].  Patient is here for fetal anatomy scan.  Her high-risk problems include:  -Advanced maternal age (44 years at delivery).  On cell-free fetal DNA screening, the risks of fetal aneuploidy's are not increased.  MSAFP screening showed low risk for open neural tube defects. - Chronic hypertension.  Patient takes labetalol  100 mg twice daily.  Blood pressure today at our office is 129/71 mmHg.  Patient takes low-dose aspirin  prophylaxis. - Newly diagnosed gestational diabetes.  Patient is yet to start checking her blood glucose levels. - Genetic carrier.  Patient is a silent carrier for alpha thalassemia and has increased carrier risk for spinal muscular atrophy.  She met with genetic counselor after ultrasound.  You will be receiving a separate letter from our genetic counselor. - Uncertain gestational age.  Ultrasound We performed a fetal anatomical survey.  Amniotic fluid is normal good fetal activity seen.  No markers of aneuploidies or obvious fetal structural defects are seen.  Fetal biometry is consistent with 18 weeks and 1 day gestation.  Fetal anatomical survey could not be completed because of fetal position. Patient understands the limitations of ultrasound in detecting fetal anomalies.  Pregnancy dating I counseled the patient that we should assign her EDD based on today's ultrasound finding.  Second trimester ultrasound dating is less accurate than the first trimester ultrasound dating. We have assigned her EDD at 02/08/2024.  Chronic hypertension -I counseled the patient on the possible adverse outcomes of severe chronic hypertension, which include maternal stroke, endorgan damage, coagulation disturbances. Placental abruption is more common. -Superimposed preeclampsia occurs in more than 30% of women with chronic hypertension I discussed the benefit of low-dose aspirin  prophylaxis that helps delaying  or preventing preeclampsia.  I encouraged her to continue low-dose aspirin  prophylaxis. -I discussed the safety profile of antihypertensives.  Labetalol  can be safely given in pregnancy.  It can be associated with low birthweights.  Alternative medications include nifedipine. -I discussed ultrasound protocol of monitoring fetal growth assessment and antenatal testing. -Timing of delivery: Because of a comorbid condition of diabetes and hypertension, delivery at [redacted] weeks gestation should be considered.  If either hypertension or diabetes is not well-controlled, delivery at [redacted] weeks gestation is reasonable.  Gestational diabetes I explained the diagnosis of gestational diabetes.  I emphasized the importance of good blood glucose control to prevent adverse fetal or neonatal outcomes.  I discussed normal range of blood glucose values. I encouraged her to check her blood glucose regularly. Possible complications of gestational diabetes include fetal macrosomia, shoulder dystocia and birth injuries, stillbirth (in poorly controlled diabetes) and neonatal respiratory syndrome and other complications.  In about 85% of cases, gestational diabetes is well controlled by diet alone.  Exercise reduces the need for insulin.  Medical treatment includes oral hypoglycemics or insulin.  Type 2 diabetes develops in up to 50% of women with GDM. I recommend postpartum screening with 75-g glucose load at 6 to 12 weeks after delivery.  Recommendations - An appointment was made for her to return in 4 weeks for completion of fetal anatomy. - Fetal growth assessments every 4 weeks till delivery. - Weekly antenatal testing from [redacted] weeks gestation until delivery. - Correction and prevention of anemia to avoid blood transfusion.  Grand multipara's women are more likely to have postpartum hemorrhage.   Consultation including face-to-face (more than 50%) counseling 45 minutes.

## 2023-09-11 ENCOUNTER — Ambulatory Visit: Payer: Self-pay | Admitting: Obstetrics and Gynecology

## 2023-09-11 DIAGNOSIS — O99810 Abnormal glucose complicating pregnancy: Secondary | ICD-10-CM

## 2023-09-14 ENCOUNTER — Encounter: Payer: Self-pay | Admitting: Pulmonary Disease

## 2023-09-14 ENCOUNTER — Ambulatory Visit: Admitting: Pulmonary Disease

## 2023-09-14 VITALS — BP 128/72 | HR 100 | Ht 64.0 in | Wt 211.4 lb

## 2023-09-14 DIAGNOSIS — A310 Pulmonary mycobacterial infection: Secondary | ICD-10-CM

## 2023-09-14 DIAGNOSIS — D869 Sarcoidosis, unspecified: Secondary | ICD-10-CM

## 2023-09-14 DIAGNOSIS — D860 Sarcoidosis of lung: Secondary | ICD-10-CM | POA: Diagnosis not present

## 2023-09-14 DIAGNOSIS — Z3A00 Weeks of gestation of pregnancy not specified: Secondary | ICD-10-CM | POA: Diagnosis not present

## 2023-09-14 DIAGNOSIS — O99519 Diseases of the respiratory system complicating pregnancy, unspecified trimester: Secondary | ICD-10-CM | POA: Diagnosis not present

## 2023-09-14 NOTE — Progress Notes (Unsigned)
 Subjective:   PATIENT ID: Sarah Arnold GENDER: female DOB: [birth year], MRN: [id number]   HPI Discussed the use of AI scribe software for clinical note transcription with the patient, who gave verbal consent to proceed.  History of Present Illness            No chief complaint on file.  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 Shelah and referred back to clinic for follow up.    Past Medical History:  Diagnosis Date   Abnormal Pap smear    Allergic rhinitis 04/10/2013   Anemia    Cough 02/11/2013   Gastritis 05/14/2013   Healthcare maintenance 02/12/2015   Hypertension    Late prenatal care    Multiple skin nodules 05/15/2014   Pneumococcal pneumonia (HCC)    Shortness of breath 02/11/2013     Family History  Problem Relation Age of Onset   Hypertension Father    Asthma Son    Arthritis Neg Hx    Alcohol abuse Neg Hx    Birth defects Neg Hx    Cancer Neg Hx    COPD Neg Hx    Depression Neg Hx    Diabetes Neg Hx    Drug abuse Neg Hx    Early death Neg Hx    Hearing loss Neg Hx    Heart disease Neg Hx    Hyperlipidemia Neg Hx    Kidney disease Neg Hx    Learning disabilities Neg Hx    Mental illness Neg Hx    Mental retardation Neg Hx    Miscarriages / Stillbirths Neg Hx    Stroke Neg Hx    Vision loss Neg Hx      Social History   Socioeconomic History   Marital status: Married    Spouse name: Not on file   Number of children: 6   Years of education: Not on file   Highest education level: Not on file  Occupational History   Occupation: unemployed  Tobacco Use   Smoking status: Never   Smokeless tobacco: Never  Vaping Use   Vaping status: Never Used  Substance and Sexual Activity   Alcohol use: No   Drug use: No   Sexual activity: Not Currently    Birth  control/protection: Implant  Other Topics Concern   Not on file  Social History Narrative   Immigrated from [place] in 2001   No known TB exposures   Lives now in [place] in [place]   Social Drivers of Health   Financial Resource Strain: Low Risk  (01/19/2023)   Overall Financial Resource Strain (CARDIA)    Difficulty of Paying Living Expenses: Not hard at all  Food Insecurity: Food Insecurity Present (09/06/2023)   Hunger Vital Sign    Worried About Running Out of Food in the Last Year: Sometimes true    Ran Out of Food in the Last Year: Sometimes true  Transportation Needs: No Transportation Needs (01/19/2023)   PRAPARE - Administrator, Civil Service (Medical): No    Lack of Transportation (Non-Medical): No  Physical Activity: Insufficiently Active (01/19/2023)   Exercise Vital Sign    Days of Exercise per Week: 2 days    Minutes of Exercise per Session: 20 min  Stress: No Stress Concern Present (01/19/2023)  [employer] of Occupational Health - Occupational Stress Questionnaire    Feeling of Stress : Not at all  Social Connections: Moderately Integrated (01/19/2023)   Social Connection and Isolation Panel    Frequency of Communication with Friends and Family: More than three times a week    Frequency of Social Gatherings with Friends and Family: More than three times a week    Attends Religious Services: Never    Database administrator or Organizations: Yes    Attends Engineer, structural: More than 4 times per year    Marital Status: Married  Catering manager Violence: Not At Risk (01/19/2023)   Humiliation, Afraid, Rape, and Kick questionnaire    Fear of Current or Ex-Partner: No    Emotionally Abused: No    Physically Abused: No    Sexually Abused: No     Allergies  Allergen Reactions   Norvasc  [Amlodipine ]     Caused constipation     Outpatient Medications Prior to Visit  Medication Sig Dispense Refill   Accu-Chek Softclix Lancets lancets Dx  code 024.419 check glucose four times a day: Fasting, 2 hours after breakfast, 2 hours after lunch and 2 hours after dinner 100 each 12   aspirin  EC 81 MG tablet Take 1 tablet (81 mg total) by mouth daily. Take after 12 weeks for prevention of preeclampsia later in pregnancy 300 tablet 2   Blood Glucose Monitoring Suppl (ACCU-CHEK GUIDE) w/Device KIT 1 kit by Does not apply route in the morning, at noon, in the evening, and at bedtime. 1 kit 0   ferrous sulfate  325 (65 FE) MG tablet Take 1 tablet (325 mg total) by mouth 2 (two) times daily with a meal. 100 tablet 0   glucose blood test strip Dx code 024.419 check glucose four times a day: Fasting, 2 hours after breakfast, 2 hours after lunch and 2 hours after dinner 100 each 12   labetalol  (NORMODYNE ) 100 MG tablet Take 1 tablet (100 mg total) by mouth 2 (two) times daily. 60 tablet 1   loratadine  (CLARITIN ) 10 MG tablet Take 1 tablet (10 mg total) by mouth daily as needed for allergies. (Patient not taking: Reported on 09/06/2023) 30 tablet 2   polyethylene glycol powder (GLYCOLAX /MIRALAX ) 17 GM/SCOOP powder Take 17 g by mouth daily as needed for mild constipation. (Patient not taking: Reported on 09/06/2023) 3350 g 1   Prenatal Vit-Fe Fumarate-FA (MULTIVITAMIN-PRENATAL) 27-0.8 MG TABS tablet Take 1 tablet by mouth daily at 12 noon.     No facility-administered medications prior to visit.    Review of Systems  Constitutional:  Negative for chills, fever, malaise/fatigue and weight loss.  HENT:  Negative for congestion, sinus pain and sore throat.   Eyes: Negative.   Respiratory:  Negative for cough, hemoptysis, sputum production, shortness of breath and wheezing.   Cardiovascular:  Negative for chest pain, palpitations, orthopnea, claudication and leg swelling.  Gastrointestinal:  Negative for abdominal pain, heartburn, nausea and vomiting.  Genitourinary: Negative.   Musculoskeletal:  Negative for joint pain and myalgias.  Skin:  Negative for  rash.  Neurological:  Negative for weakness.  Endo/Heme/Allergies: Negative.   Psychiatric/Behavioral: Negative.        Objective:  There were no vitals filed for this visit.   Physical Exam Constitutional:      General: She is not in acute distress.    Appearance: Normal appearance.  Eyes:     General: No scleral icterus.    Conjunctiva/sclera: Conjunctivae normal.  Cardiovascular:     Rate and Rhythm: Normal rate and regular rhythm.  Pulmonary:     Breath sounds: No wheezing, rhonchi or rales.  Musculoskeletal:     Right lower leg: No edema.     Left lower leg: No edema.  Skin:    General: Skin is warm and dry.  Neurological:     General: No focal deficit present.       CBC    Component Value Date/Time   WBC 11.5 (H) 09/06/2023 1030   WBC 9.4 07/24/2023 1839   RBC 4.30 09/06/2023 1030   RBC 4.66 07/24/2023 1839   HGB 10.2 (L) 09/06/2023 1030   HCT 33.6 (L) 09/06/2023 1030   PLT 383 09/06/2023 1030   MCV 78 (L) 09/06/2023 1030   MCH 23.7 (L) 09/06/2023 1030   MCH 23.4 (L) 07/24/2023 1839   MCHC 30.4 (L) 09/06/2023 1030   MCHC 31.4 07/24/2023 1839   RDW 17.1 (H) 09/06/2023 1030   LYMPHSABS 2.1 09/06/2023 1030   MONOABS 0.8 02/12/2015 1105   EOSABS 0.5 (H) 09/06/2023 1030   BASOSABS 0.0 09/06/2023 1030      Latest Ref Rng & Units 07/24/2023    6:39 PM 02/17/2023    5:01 PM 10/26/2022    5:59 PM  BMP  Glucose 70 - 99 mg/dL 896  892  91   BUN 6 - 20 mg/dL 7  15  11    Creatinine 0.44 - 1.00 mg/dL 9.19  8.95  8.96   BUN/Creat Ratio 9 - 23  14    Sodium 135 - 145 mmol/L 135  141  138   Potassium 3.5 - 5.1 mmol/L 3.8  4.2  3.7   Chloride 98 - 111 mmol/L 101  104  103   CO2 22 - 32 mmol/L 23  24  25    Calcium 8.9 - 10.3 mg/dL 9.5  9.6  9.4    Chest imaging: CT Chest 2017 1. The overall appearance the chest is very similar to prior studies, as detailed above, with a spectrum of findings in the lungs that is compatible with underlying sarcoidosis. The  widespread ground-glass attenuation centrilobular micronodularity is somewhat unusual in the setting of sarcoidosis, raising the possibility of superimposed chronic atypical infections such is MAI (mycobacterium avium intracellulare).  PFT:     No data to display          Labs:  Path:  Echo:  Heart Catheterization:       Assessment & Plan:   No diagnosis found. Assessment and Plan              Discussion: ***  Dorn Chill, MD [facility] Office: [phone]   Current Outpatient Medications:    Accu-Chek Softclix Lancets lancets, Dx code 024.419 check glucose four times a day: Fasting, 2 hours after breakfast, 2 hours after lunch and 2 hours after dinner, Disp: 100 each, Rfl: 12   aspirin  EC 81 MG tablet, Take 1 tablet (81 mg total) by mouth daily. Take after 12 weeks for prevention of preeclampsia later in pregnancy, Disp: 300 tablet, Rfl: 2   Blood Glucose Monitoring Suppl (ACCU-CHEK GUIDE) w/Device KIT, 1 kit by Does not apply route in the morning, at noon, in the evening, and at bedtime., Disp: 1 kit, Rfl: 0   ferrous sulfate  325 (65 FE) MG tablet, Take 1 tablet (325 mg total) by mouth 2 (two) times daily with a meal., Disp: 100 tablet, Rfl: 0   glucose  blood test strip, Dx code 024.419 check glucose four times a day: Fasting, 2 hours after breakfast, 2 hours after lunch and 2 hours after dinner, Disp: 100 each, Rfl: 12   labetalol  (NORMODYNE ) 100 MG tablet, Take 1 tablet (100 mg total) by mouth 2 (two) times daily., Disp: 60 tablet, Rfl: 1   loratadine  (CLARITIN ) 10 MG tablet, Take 1 tablet (10 mg total) by mouth daily as needed for allergies. (Patient not taking: Reported on 09/06/2023), Disp: 30 tablet, Rfl: 2   polyethylene glycol powder (GLYCOLAX /MIRALAX ) 17 GM/SCOOP powder, Take 17 g by mouth daily as needed for mild constipation. (Patient not taking: Reported on 09/06/2023), Disp: 3350 g, Rfl: 1   Prenatal Vit-Fe Fumarate-FA  (MULTIVITAMIN-PRENATAL) 27-0.8 MG TABS tablet, Take 1 tablet by mouth daily at 12 noon., Disp: , Rfl:

## 2023-09-14 NOTE — Patient Instructions (Signed)
 We will schedule you for breathing tests at the front desk  We will plan to do a CT Chest scan in the future after you deliver your baby.  Follow up in 3 months, call sooner if you develop any new respiratory symptoms.

## 2023-09-15 ENCOUNTER — Encounter: Payer: Self-pay | Admitting: Pulmonary Disease

## 2023-09-19 ENCOUNTER — Ambulatory Visit (INDEPENDENT_AMBULATORY_CARE_PROVIDER_SITE_OTHER): Admitting: Advanced Practice Midwife

## 2023-09-19 ENCOUNTER — Other Ambulatory Visit: Payer: Self-pay

## 2023-09-19 VITALS — BP 137/73 | HR 93 | Wt 210.1 lb

## 2023-09-19 DIAGNOSIS — O99212 Obesity complicating pregnancy, second trimester: Secondary | ICD-10-CM

## 2023-09-19 DIAGNOSIS — D869 Sarcoidosis, unspecified: Secondary | ICD-10-CM

## 2023-09-19 DIAGNOSIS — Z3A19 19 weeks gestation of pregnancy: Secondary | ICD-10-CM

## 2023-09-19 DIAGNOSIS — O24419 Gestational diabetes mellitus in pregnancy, unspecified control: Secondary | ICD-10-CM | POA: Diagnosis not present

## 2023-09-19 DIAGNOSIS — O10912 Unspecified pre-existing hypertension complicating pregnancy, second trimester: Secondary | ICD-10-CM

## 2023-09-19 DIAGNOSIS — O09522 Supervision of elderly multigravida, second trimester: Secondary | ICD-10-CM

## 2023-09-19 DIAGNOSIS — O99210 Obesity complicating pregnancy, unspecified trimester: Secondary | ICD-10-CM

## 2023-09-19 DIAGNOSIS — O0992 Supervision of high risk pregnancy, unspecified, second trimester: Secondary | ICD-10-CM | POA: Diagnosis not present

## 2023-09-19 DIAGNOSIS — O10919 Unspecified pre-existing hypertension complicating pregnancy, unspecified trimester: Secondary | ICD-10-CM

## 2023-09-19 DIAGNOSIS — Z148 Genetic carrier of other disease: Secondary | ICD-10-CM

## 2023-09-19 DIAGNOSIS — O0990 Supervision of high risk pregnancy, unspecified, unspecified trimester: Secondary | ICD-10-CM

## 2023-09-19 DIAGNOSIS — D563 Thalassemia minor: Secondary | ICD-10-CM

## 2023-09-19 LAB — GLUCOSE, CAPILLARY: Glucose-Capillary: 105 mg/dL — ABNORMAL HIGH (ref 70–99)

## 2023-09-19 NOTE — Progress Notes (Cosign Needed Addendum)
 PRENATAL VISIT NOTE  Subjective:  Sarah Arnold is a 43 y.o. G7P6006 at [redacted]w[redacted]d being seen today for ongoing prenatal care.  She is currently monitored for the following issues for this high-risk pregnancy and has MAIC (mycobacterium avium-intracellulare complex) (HCC); Sarcoidosis; Class 2 severe obesity with serious comorbidity and body mass index (BMI) of 35.0 to 35.9 in adult [facility]); Supervision of high risk pregnancy, antepartum; Pre-existing hypertension affecting pregnancy; GDM (gestational diabetes mellitus); AMA (advanced maternal age) multigravida 35+; Obesity affecting pregnancy, antepartum; Alpha thalassemia silent carrier; Increased Carrier risk for spinal muscular atrophy; and Late prenatal care affecting pregnancy in second trimester on their problem list.  Patient reports no complaints.  Contractions: Not present. Vag. Bleeding: None.  Movement: Present. Denies leaking of fluid.   The following portions of the patient's history were reviewed and updated as appropriate: allergies, current medications, past family history, past medical history, past social history, past surgical history and problem list.   Objective:    Vitals:   09/19/23 0910  BP: 137/73  Pulse: 93  Weight: 210 lb 1.6 oz (95.3 kg)    Fetal Status:  Fetal Heart Rate (bpm): 151   Movement: Present    General: Alert, oriented and cooperative. Patient is in no acute distress.  Skin: Skin is warm and dry. No rash noted.   Cardiovascular: Normal heart rate noted  Respiratory: Normal respiratory effort, no problems with respiration noted  Abdomen: Soft, gravid, appropriate for gestational age.  Pain/Pressure: Absent     Pelvic: Cervical exam deferred        Extremities: Normal range of motion.  Edema: None  Mental Status: Normal mood and affect. Normal behavior. Normal judgment and thought content.   Assessment and Plan:  Pregnancy: G7P6006 at [redacted]w[redacted]d 1. Supervision of high risk pregnancy, antepartum  (Primary) Doing well. BP and FHR normal today. Feeling good movement.  2. [redacted] weeks gestation of pregnancy  3. Pre-existing hypertension affecting pregnancy, antepartum Stable on labetalol  100 mg 2 times daily.  4. Gestational diabetes mellitus (GDM), antepartum, gestational diabetes method of control unspecified Follow up u/s on 9/16. Random GTT today Fastin BS: lowest 89 and highest 105. PP BS average 120s with the highest being 132 once. Patient advised to bring BS log with her to every appt.  5. Sarcoidosis Appt with pulmonologist on 8/21. Continue to monitor symptoms. Follow up in 3 months  6. Obesity affecting pregnancy, antepartum, unspecified obesity type   7. Multigravida of advanced maternal age in second trimester  8. Increased Carrier risk for spinal muscular atrophy Declined FOB Testing - AFP, Serum, Open Spina Bifida  9. Alpha thalassemia silent carrier Declined FOB testing - AFP, Serum, Open Spina Bifida     Preterm labor symptoms and general obstetric precautions including but not limited to vaginal bleeding, contractions, leaking of fluid and fetal movement were reviewed in detail with the patient. Please refer to After Visit Summary for other counseling recommendations.   No follow-ups on file.  Future Appointments  Date Time Provider Department Center  09/21/2023  8:15 AM [facility]  10/04/2023  8:55 AM Anyanwu, Gloris LABOR, MD [facility]  10/10/2023  3:30 PM [facility] PROVIDER 1 [facility]  10/10/2023  3:45 PM [facility] US3 [facility]  10/24/2023  4:15 PM Nicholaus Burnard HERO, MD [facility]  10/26/2023  8:15 AM Izell Harari, MD [facility]  11/07/2023  4:15 PM Izell Harari, MD [facility]  11/09/2023 10:55 AM Herchel Gloris LABOR, MD [facility]  11/20/2023  10:15 AM Eldonna Suzen Octave, MD [facility]  12/04/2023  1:15 PM [facility] 1 [facility]  12/15/2023 10:00 AM [facility] PFT ROOM [facility] None  12/15/2023 11:30 AM Kara Dorn NOVAK, MD [facility] None  12/18/2023  1:15 PM [facility] 1 [facility]  01/01/2024  1:15 PM [facility] 1 [facility]  01/15/2024  1:15 PM [facility] 1 [facility]  01/22/2024  1:15 PM [facility] 1 [facility]  01/29/2024  1:15 PM [facility] 1 [facility]  02/05/2024  1:15 PM [facility] 1 [facility]  02/12/2024  1:15 PM [facility] 1 [facility]    Reet Scharrer JINNY Freund, NP Student

## 2023-09-21 ENCOUNTER — Encounter: Admitting: Dietician

## 2023-09-21 ENCOUNTER — Other Ambulatory Visit: Payer: Self-pay

## 2023-09-21 ENCOUNTER — Ambulatory Visit: Admitting: Internal Medicine

## 2023-09-21 ENCOUNTER — Ambulatory Visit: Payer: Self-pay | Admitting: Dietician

## 2023-09-21 DIAGNOSIS — O24419 Gestational diabetes mellitus in pregnancy, unspecified control: Secondary | ICD-10-CM

## 2023-09-21 DIAGNOSIS — Z3A21 21 weeks gestation of pregnancy: Secondary | ICD-10-CM | POA: Diagnosis not present

## 2023-09-21 DIAGNOSIS — Z3A20 20 weeks gestation of pregnancy: Secondary | ICD-10-CM

## 2023-09-21 DIAGNOSIS — Z713 Dietary counseling and surveillance: Secondary | ICD-10-CM | POA: Diagnosis present

## 2023-09-21 LAB — AFP, SERUM, OPEN SPINA BIFIDA
AFP MoM: 1.15
AFP Value: 50.9 ng/mL
Gest. Age on Collection Date: 19.7 wk
Maternal Age At EDD: 43 a
OSBR Risk 1 IN: 7603
Test Results:: NEGATIVE
Weight: 210 [lb_av]

## 2023-10-04 ENCOUNTER — Ambulatory Visit (INDEPENDENT_AMBULATORY_CARE_PROVIDER_SITE_OTHER): Admitting: Obstetrics & Gynecology

## 2023-10-04 ENCOUNTER — Other Ambulatory Visit: Payer: Self-pay

## 2023-10-04 VITALS — BP 122/76 | HR 91 | Wt 212.1 lb

## 2023-10-04 DIAGNOSIS — O10912 Unspecified pre-existing hypertension complicating pregnancy, second trimester: Secondary | ICD-10-CM | POA: Diagnosis not present

## 2023-10-04 DIAGNOSIS — O10919 Unspecified pre-existing hypertension complicating pregnancy, unspecified trimester: Secondary | ICD-10-CM

## 2023-10-04 DIAGNOSIS — O24419 Gestational diabetes mellitus in pregnancy, unspecified control: Secondary | ICD-10-CM | POA: Diagnosis not present

## 2023-10-04 DIAGNOSIS — O09522 Supervision of elderly multigravida, second trimester: Secondary | ICD-10-CM | POA: Diagnosis not present

## 2023-10-04 DIAGNOSIS — Z3A21 21 weeks gestation of pregnancy: Secondary | ICD-10-CM

## 2023-10-04 DIAGNOSIS — D869 Sarcoidosis, unspecified: Secondary | ICD-10-CM

## 2023-10-04 DIAGNOSIS — O0990 Supervision of high risk pregnancy, unspecified, unspecified trimester: Secondary | ICD-10-CM

## 2023-10-04 DIAGNOSIS — O0992 Supervision of high risk pregnancy, unspecified, second trimester: Secondary | ICD-10-CM

## 2023-10-04 LAB — GLUCOSE, CAPILLARY: Glucose-Capillary: 88 mg/dL (ref 70–99)

## 2023-10-04 NOTE — Progress Notes (Signed)
 PRENATAL VISIT NOTE  Subjective:  Sarah Arnold is a 43 y.o. G7P6006 at [redacted]w[redacted]d being seen today for ongoing prenatal care.  She is currently monitored for the following issues for this high-risk pregnancy and has MAIC (mycobacterium avium-intracellulare complex) (HCC); Sarcoidosis; Class 2 severe obesity with serious comorbidity and body mass index (BMI) of 35.0 to 35.9 in adult [facility]); Supervision of high risk pregnancy, antepartum; Pre-existing hypertension affecting pregnancy; GDM (gestational diabetes mellitus); AMA (advanced maternal age) multigravida 35+; Obesity affecting pregnancy, antepartum; Alpha thalassemia silent carrier; Increased Carrier risk for spinal muscular atrophy; and Late prenatal care affecting pregnancy in second trimester on their problem list.  Patient reports no complaints.  Contractions: Not present. Vag. Bleeding: None.  Movement: Present. Denies leaking of fluid.   The following portions of the patient's history were reviewed and updated as appropriate: allergies, current medications, past family history, past medical history, past social history, past surgical history and problem list.   Objective:    Vitals:   10/04/23 0911  BP: 122/76  Pulse: 91  Weight: 212 lb 1.6 oz (96.2 kg)    Fetal Status:  Fetal Heart Rate (bpm): 155   Movement: Present    General: Alert, oriented and cooperative. Patient is in no acute distress.  Skin: Skin is warm and dry. No rash noted.   Cardiovascular: Normal heart rate noted  Respiratory: Normal respiratory effort, no problems with respiration noted  Abdomen: Soft, gravid, appropriate for gestational age.  Pain/Pressure: Absent     Pelvic: Cervical exam deferred        Extremities: Normal range of motion.  Edema: None  Mental Status: Normal mood and affect. Normal behavior. Normal judgment and thought content.   US  MFM OB DETAIL +14 WK Result Date:  09/08/2023 ----------------------------------------------------------------------  OBSTETRICS REPORT                       (Signed Final 09/08/2023 04:22 pm) ---------------------------------------------------------------------- Patient Info  ID #:       [id number]                          D.O.B.:  Jul 29, 1980 (43 yrs)(F)  Name:       Sarah Arnold                 Visit Date: 09/08/2023 01:53 pm ---------------------------------------------------------------------- Performed By  Attending:        Fredia Fresh MD        Ref. Address:     [address]                                                             [place], [place]  [address]  Performed By:     Joyann Allen RDMS      [facility]  Referred By:      [facility] Department ---------------------------------------------------------------------- Orders  #  Description                           Code        Ordered By  1  US  MFM OB DETAIL +14 WK               76811.01    [facility] ----------------------------------------------------------------------  #  Order #                     Accession #                Episode #  1  [id number]                   [id number]                 [id number] ---------------------------------------------------------------------- Indications  Advanced maternal age multigravida 1+,        O52.522  second trimester (43)  Obesity complicating pregnancy, second         O99.212  trimester (34.83)  Grand multiparity, antepartum                  O09.40  Gestational diabetes in pregnancy,             O24.419  unspecified control   Hypertension - Chronic/Pre-existing            O10.019  (Labetalol )  Medical complication of pregnancy              O26.90  (Sarcoidosis)  [redacted] weeks gestation of pregnancy                Z3A.18  Low Risk NIPS Neg AFP  Genetic carrier (silent alphal thal SMA)       Z14.8 ---------------------------------------------------------------------- Vital Signs  BP:          129/71 ---------------------------------------------------------------------- Fetal  Evaluation  Num Of Fetuses:         1  Fetal Heart Rate(bpm):  167  Cardiac Activity:       Observed  Presentation:           Breech  Placenta:               Posterior  P. Cord Insertion:      Visualized  Amniotic Fluid  AFI FV:      Within normal limits                              Largest Pocket(cm)                              6.27 ---------------------------------------------------------------------- Biometry  BPD:      40.1  mm     G. Age:  18w 1d         52  %    CI:        73.08   %    70 - 86                                                          FL/HC:      18.3   %    15.8 - 18  HC:      149.1  mm     G. Age:  18w 0d         35  %    HC/AC:      1.18        1.07 - 1.29  AC:      126.6  mm     G. Age:  18w 2d         51  %    FL/BPD:     68.1   %  FL:       27.3  mm     G. Age:  18w 2d         52  %    FL/AC:      21.6   %    20 - 24  HUM:      26.8  mm     G. Age:  18w 4d         65  %  CER:      18.4  mm     G. Age:  18w 1d         54  %  NFT:       2.6  mm  Est. FW:     231  gm      0 lb 8 oz     52  % ---------------------------------------------------------------------- OB History  Blood Type:   O+  Gravidity:    7         Term:   6  Living:       6 ---------------------------------------------------------------------- Gestational Age  LMP:           23w 4d        Date:  03/27/23                   EDD:   01/01/24  U/S Today:  18w 1d                                        EDD:   02/08/24  Best:          18w 1d     Det. By:  U/S (09/08/23)           EDD:    02/08/24 ---------------------------------------------------------------------- Targeted Anatomy  Central Nervous System  Calvarium/Cranial V.:  Appears normal         Cereb./Vermis:          Appears normal  Cavum:                 Appears normal         Cisterna Magna:         Appears normal  Lateral Ventricles:    Appears normal         Midline Falx:           Appears normal  Choroid Plexus:        Appears normal  Spine  Cervical:              Not well visualized    Sacral:                 Not well visualized  Thoracic:              Not well visualized    Shape/Curvature:        Not well visualized  Lumbar:                Not well visualized  Head/Neck  Lips:                  Appears normal         Profile:                Appears normal  Neck:                  Appears normal         Orbits/Eyes:            Appears normal  Nuchal Fold:           Appears normal         Mandible:               Appears normal  Nasal Bone:            Present                Maxilla:                Appears normal  Thorax  4 Chamber View:        Appears normal         Interventr. Septum:     Appears normal  Cardiac Rhythm:        Normal                 Cardiac Axis:           Normal  Cardiac Situs:         Appears normal         Diaphragm:              Appears normal  Rt Outflow Tract:      Appears normal         3  Vessel View:          Not well visualized  Lt Outflow Tract:      Appears normal         3 V Trachea View:       Appears normal  Aortic Arch:           Appears normal         IVC:                    Appears normal  Ductal Arch:           Appears normal         Crossing:               Appears normal  SVC:                   Appears normal  Abdomen  Ventral Wall:          Appears normal         Lt Kidney:              Appears normal  Cord Insertion:        Appears normal         Rt Kidney:              Appears normal  Situs:                 Appears normal         Bladder:                Appears normal  Stomach:               Appears  normal  Extremities  Lt Humerus:            Appears normal         Lt Femur:               Appears normal  Rt Humerus:            Appears normal         Rt Femur:               Appears normal  Lt Forearm:            Appears normal         Lt Lower Leg:           Appears normal  Rt Forearm:            Appears normal         Rt Lower Leg:           Appears normal  Lt Hand:               Open hand nml          Lt Foot:                Nml heel/foot  Rt Hand:               Open hand nml          Rt Foot:                Nml heel/foot  Other  Umbilical Cord:        Normal 3-vessel        Genitalia:              Female-nml ---------------------------------------------------------------------- Cervix Uterus Adnexa  Cervix  Length:  3  cm.  Not visualized (advanced GA >24wks)  Uterus  No abnormality visualized.  Right Ovary  Size(cm)     3.09   x   2.38   x  3.06      Vol(ml): 11.78  Within normal limits.  Left Ovary  Size(cm)     3.17   x   2.17   x  1.93      Vol(ml): 6.95  Within normal limits.  Cul De Sac  No free fluid seen.  Adnexa  No abnormality visualized ---------------------------------------------------------------------- Impression  G7 P6006.  Patient is here for fetal anatomy scan.  Her high-  risk problems include:  -Advanced maternal age (44 years at delivery).  On cell-free  fetal DNA screening, the risks of fetal aneuploidy's are not  increased.  MSAFP screening showed low risk for open  neural tube defects.  - Chronic hypertension.  Patient takes labetalol  100 mg twice  daily.  Blood pressure today at our office is 129/71 mmHg.  Patient takes low-dose aspirin  prophylaxis.  - Newly diagnosed gestational diabetes.  Patient is yet to start  checking her blood glucose levels.  - Genetic carrier.  Patient is a silent carrier for alpha  thalassemia and has increased carrier risk for spinal  muscular atrophy.  She met with genetic counselor after  ultrasound.  You will be receiving a separate letter from  our  genetic counselor.  - Uncertain gestational age.  Ultrasound  We performed a fetal anatomical survey.  Amniotic fluid is  normal good fetal activity seen.  No markers of aneuploidies  or obvious fetal structural defects are seen.  Fetal biometry is  consistent with 18 weeks and 1 day gestation.  Fetal  anatomical survey could not be completed because of fetal  position.  Patient understands the limitations of ultrasound in detecting  fetal anomalies.  Pregnancy dating  I counseled the patient that we should assign her EDD based  on today's ultrasound finding.  Second trimester ultrasound  dating is less accurate than the first trimester ultrasound  dating.  We have assigned her EDD at 02/08/2024.  Chronic hypertension  -I counseled the patient on the possible adverse outcomes of  severe chronic hypertension, which include maternal stroke,  endorgan damage, coagulation disturbances. Placental  abruption is more common.  -Superimposed preeclampsia occurs in more than 30% of  women with chronic hypertension  I discussed the benefit of low-dose aspirin  prophylaxis that  helps delaying or preventing preeclampsia.  I encouraged her  to continue low-dose aspirin  prophylaxis.  -I discussed the safety profile of antihypertensives.  Labetalol   can be safely given in pregnancy.  It can be associated with  low birthweights.  Alternative medications include nifedipine.  -I discussed ultrasound protocol of monitoring fetal growth  assessment and antenatal testing.  -Timing of delivery: Because of a comorbid condition of  diabetes and hypertension, delivery at [redacted] weeks gestation  should be considered.  If either hypertension or diabetes is  not well-controlled, delivery at [redacted] weeks gestation is  reasonable.  Gestational diabetes  I explained the diagnosis of gestational diabetes.  I  emphasized the importance of good blood glucose control to  prevent adverse fetal or neonatal outcomes.  I discussed  normal range of blood  glucose values. I encouraged her to  check her blood glucose regularly.  Possible complications of gestational diabetes include fetal  macrosomia, shoulder dystocia and birth injuries, stillbirth (in  poorly controlled diabetes) and neonatal respiratory  syndrome  and other complications.  In about 85% of cases, gestational diabetes is well controlled  by diet alone.  Exercise reduces the need for insulin.  Medical  treatment includes oral hypoglycemics or insulin.  Type 2 diabetes develops in up to 50% of women with GDM. I  recommend postpartum screening with 75-g glucose load at  6 to 12 weeks after delivery. ---------------------------------------------------------------------- Recommendations  - An appointment was made for her to return in 4 weeks for  completion of fetal anatomy.  - Fetal growth assessments every 4 weeks till delivery.  - Weekly antenatal testing from [redacted] weeks gestation until  delivery.  - Correction and prevention of anemia to avoid blood  transfusion.  Grand multipara's women are more likely to  have postpartum hemorrhage. ----------------------------------------------------------------------                 Fredia Fresh, MD Electronically Signed Final Report   09/08/2023 04:22 pm ----------------------------------------------------------------------    Assessment and Plan:  Pregnancy: [id number] at [redacted]w[redacted]d 1. Pre-existing hypertension affecting pregnancy, antepartum (Primary) BP stable on Labetalol , continue ASA. Growth scans and antenatal screening in third trimester as per MFM.   2. Gestational diabetes mellitus (GDM), antepartum, gestational diabetes method of control unspecified Did not bring log, but reports mostly within range. Fasting BS done here in office today was 88. Continue diet and exercise control.  3. Sarcoidosis Followed by Pulmonology.  4. Multigravida of advanced maternal age in second trimester LR NIPS. Growth scans and antenatal screening in third trimester as  per MFM.   5. [redacted] weeks gestation of pregnancy 6. Supervision of high risk pregnancy, antepartum Preterm labor symptoms and general obstetric precautions including but not limited to vaginal bleeding, contractions, leaking of fluid and fetal movement were reviewed in detail with the patient. Please refer to After Visit Summary for other counseling recommendations.   Return in about 20 days (around 10/24/2023) for OFFICE OB VISIT (MD only).  Future Appointments  Date Time Provider Department Center  10/10/2023  3:30 PM [facility] PROVIDER 1 [facility]  10/10/2023  3:45 PM [facility] US3 [facility]  10/24/2023  4:15 PM Nicholaus Burnard HERO, MD [facility]  10/26/2023  8:15 AM Izell Harari, MD [facility]  11/07/2023  4:15 PM Izell Harari, MD [facility]  11/09/2023 10:55 AM Herchel, Gloris LABOR, MD [facility]  11/20/2023 10:15 AM Eldonna Suzen Octave, MD [facility]  11/23/2023  9:15 AM WMC-EDUCATION [facility]  12/04/2023  1:15 PM W[facility]-GENERAL 1 [facility]  12/15/2023 10:00 AM [facility] PFT ROOM [facility] [address]  12/15/2023 11:30 AM Kara Dorn NOVAK, MD [facility] [address]  12/18/2023  1:15 PM [facility]-GENERAL 1 [facility]  01/01/2024  1:15 PM W[facility]-GENERAL 1 [facility]  01/15/2024  1:15 PM W[facility]-GENERAL 1 [facility]  01/22/2024  1:15 PM WMC-GENERAL 1 [facility]  01/29/2024  1:15 PM WMC-GENERAL 1 [facility]  02/05/2024  1:15 PM WMC-GENERAL 1 [facility]  02/12/2024  1:15 PM WMC-GENERAL 1 [facility]    Gloris Herchel, MD

## 2023-10-06 ENCOUNTER — Ambulatory Visit: Payer: Self-pay | Admitting: Advanced Practice Midwife

## 2023-10-10 ENCOUNTER — Ambulatory Visit (HOSPITAL_BASED_OUTPATIENT_CLINIC_OR_DEPARTMENT_OTHER)

## 2023-10-10 ENCOUNTER — Ambulatory Visit: Attending: Obstetrics and Gynecology | Admitting: Obstetrics and Gynecology

## 2023-10-10 ENCOUNTER — Other Ambulatory Visit: Payer: Self-pay | Admitting: *Deleted

## 2023-10-10 DIAGNOSIS — O0942 Supervision of pregnancy with grand multiparity, second trimester: Secondary | ICD-10-CM

## 2023-10-10 DIAGNOSIS — O10012 Pre-existing essential hypertension complicating pregnancy, second trimester: Secondary | ICD-10-CM

## 2023-10-10 DIAGNOSIS — O99212 Obesity complicating pregnancy, second trimester: Secondary | ICD-10-CM

## 2023-10-10 DIAGNOSIS — O24419 Gestational diabetes mellitus in pregnancy, unspecified control: Secondary | ICD-10-CM | POA: Diagnosis present

## 2023-10-10 DIAGNOSIS — O10912 Unspecified pre-existing hypertension complicating pregnancy, second trimester: Secondary | ICD-10-CM | POA: Insufficient documentation

## 2023-10-10 DIAGNOSIS — Z3A22 22 weeks gestation of pregnancy: Secondary | ICD-10-CM | POA: Insufficient documentation

## 2023-10-10 DIAGNOSIS — D563 Thalassemia minor: Secondary | ICD-10-CM

## 2023-10-10 DIAGNOSIS — E669 Obesity, unspecified: Secondary | ICD-10-CM | POA: Diagnosis not present

## 2023-10-10 DIAGNOSIS — Z362 Encounter for other antenatal screening follow-up: Secondary | ICD-10-CM | POA: Diagnosis not present

## 2023-10-10 DIAGNOSIS — O10919 Unspecified pre-existing hypertension complicating pregnancy, unspecified trimester: Secondary | ICD-10-CM

## 2023-10-10 DIAGNOSIS — O24410 Gestational diabetes mellitus in pregnancy, diet controlled: Secondary | ICD-10-CM

## 2023-10-10 DIAGNOSIS — O09522 Supervision of elderly multigravida, second trimester: Secondary | ICD-10-CM

## 2023-10-10 DIAGNOSIS — Z148 Genetic carrier of other disease: Secondary | ICD-10-CM

## 2023-10-10 DIAGNOSIS — O0990 Supervision of high risk pregnancy, unspecified, unspecified trimester: Secondary | ICD-10-CM

## 2023-10-10 NOTE — Progress Notes (Signed)
 After review, MFM consult with provider is not indicated for today  Sarah Ranks, MD 10/10/2023 4:40 PM  [facility]

## 2023-10-24 ENCOUNTER — Encounter: Payer: Self-pay | Admitting: Obstetrics and Gynecology

## 2023-10-24 ENCOUNTER — Other Ambulatory Visit: Payer: Self-pay

## 2023-10-24 ENCOUNTER — Other Ambulatory Visit (HOSPITAL_COMMUNITY)
Admission: RE | Admit: 2023-10-24 | Discharge: 2023-10-24 | Disposition: A | Discharge status: Home / Self Care | Source: Ambulatory Visit | Attending: Obstetrics and Gynecology | Admitting: Obstetrics and Gynecology

## 2023-10-24 ENCOUNTER — Encounter: Payer: Self-pay | Admitting: Pulmonary Disease

## 2023-10-24 ENCOUNTER — Ambulatory Visit: Admitting: Obstetrics and Gynecology

## 2023-10-24 VITALS — BP 125/78 | HR 90 | Wt 212.0 lb

## 2023-10-24 DIAGNOSIS — O10912 Unspecified pre-existing hypertension complicating pregnancy, second trimester: Secondary | ICD-10-CM

## 2023-10-24 DIAGNOSIS — O23592 Infection of other part of genital tract in pregnancy, second trimester: Secondary | ICD-10-CM | POA: Diagnosis present

## 2023-10-24 DIAGNOSIS — O0992 Supervision of high risk pregnancy, unspecified, second trimester: Secondary | ICD-10-CM

## 2023-10-24 DIAGNOSIS — B3731 Acute candidiasis of vulva and vagina: Secondary | ICD-10-CM | POA: Insufficient documentation

## 2023-10-24 DIAGNOSIS — Z113 Encounter for screening for infections with a predominantly sexual mode of transmission: Secondary | ICD-10-CM | POA: Insufficient documentation

## 2023-10-24 DIAGNOSIS — Z3A24 24 weeks gestation of pregnancy: Secondary | ICD-10-CM

## 2023-10-24 DIAGNOSIS — E66812 Obesity, class 2: Secondary | ICD-10-CM

## 2023-10-24 DIAGNOSIS — O99212 Obesity complicating pregnancy, second trimester: Secondary | ICD-10-CM

## 2023-10-24 DIAGNOSIS — O24419 Gestational diabetes mellitus in pregnancy, unspecified control: Secondary | ICD-10-CM

## 2023-10-24 DIAGNOSIS — O99210 Obesity complicating pregnancy, unspecified trimester: Secondary | ICD-10-CM

## 2023-10-24 DIAGNOSIS — O98812 Other maternal infectious and parasitic diseases complicating pregnancy, second trimester: Secondary | ICD-10-CM | POA: Diagnosis not present

## 2023-10-24 DIAGNOSIS — D869 Sarcoidosis, unspecified: Secondary | ICD-10-CM

## 2023-10-24 DIAGNOSIS — Z6835 Body mass index (BMI) 35.0-35.9, adult: Secondary | ICD-10-CM

## 2023-10-24 DIAGNOSIS — O10919 Unspecified pre-existing hypertension complicating pregnancy, unspecified trimester: Secondary | ICD-10-CM

## 2023-10-24 DIAGNOSIS — O0990 Supervision of high risk pregnancy, unspecified, unspecified trimester: Secondary | ICD-10-CM

## 2023-10-24 DIAGNOSIS — O09522 Supervision of elderly multigravida, second trimester: Secondary | ICD-10-CM

## 2023-10-24 NOTE — Progress Notes (Signed)
 PRENATAL VISIT NOTE  Subjective:  Sarah Arnold is a 43 y.o. G7P6006 at [redacted]w[redacted]d being seen today for ongoing prenatal care.  She is currently monitored for the following issues for this high-risk pregnancy and has MAIC (mycobacterium avium-intracellulare complex) (HCC); Sarcoidosis; Class 2 severe obesity with serious comorbidity and body mass index (BMI) of 35.0 to 35.9 in adult; Supervision of high risk pregnancy, antepartum; Pre-existing hypertension affecting pregnancy; GDM (gestational diabetes mellitus); AMA (advanced maternal age) multigravida 35+; Obesity affecting pregnancy, antepartum; Alpha thalassemia silent carrier; Increased Carrier risk for spinal muscular atrophy; and Late prenatal care affecting pregnancy in second trimester on their problem list.  Patient reports vaginal irritation and itching.  Contractions: Not present. Vag. Bleeding: None.  Movement: Present. Denies leaking of fluid.   The following portions of the patient's history were reviewed and updated as appropriate: allergies, current medications, past family history, past medical history, past social history, past surgical history and problem list.   Objective:    Vitals:   10/24/23 1627  BP: 125/78  Pulse: 90  Weight: 212 lb (96.2 kg)    Fetal Status:  Fetal Heart Rate (bpm): 141   Movement: Present    General: Alert, oriented and cooperative. Patient is in no acute distress.  Skin: Skin is warm and dry. No rash noted.   Cardiovascular: Normal heart rate noted  Respiratory: Normal respiratory effort, no problems with respiration noted  Abdomen: Soft, gravid, appropriate for gestational age.  Pain/Pressure: Absent     Pelvic: Cervical exam deferred        Extremities: Normal range of motion.  Edema: None  Mental Status: Normal mood and affect. Normal behavior. Normal judgment and thought content.   Assessment and Plan:  Pregnancy: G7P6006 at [redacted]w[redacted]d  1. Supervision of high risk pregnancy, antepartum  (Primary)  2. [redacted] weeks gestation of pregnancy  3. Vaginitis affecting pregnancy in second trimester, antepartum Swab today  4. Gestational diabetes mellitus (GDM), antepartum, gestational diabetes method of control unspecified Reviewed log,, ~ 50% of fasting are elevated and 75% of pp are elevated very slightly Recommended starting medication, she would like to try improving her diet, this is reasonable as her CBGs are only very slightly over range - will see she does over next few weeks and reviewed that we may need to add medication, she verbalizes understanding  5. Pre-existing hypertension affecting pregnancy, antepartum Cont labetalol  100 mg BID  6. Sarcoidosis No lung symptoms  7. Class 2 severe obesity with serious comorbidity and body mass index (BMI) of 35.0 to 35.9 in adult, unspecified obesity type Cont baby aspirin   8. Multigravida of advanced maternal age in second trimester  29. Obesity affecting pregnancy, antepartum, unspecified obesity type  Preterm labor symptoms and general obstetric precautions including but not limited to vaginal bleeding, contractions, leaking of fluid and fetal movement were reviewed in detail with the patient. Please refer to After Visit Summary for other counseling recommendations.   Return in 2 weeks (on 11/07/2023) for [facility] ob visit, high OB.  Future Appointments  Date Time Provider Department Center  10/26/2023  8:15 AM Izell Harari, MD [facility]  11/07/2023  4:15 PM Izell Harari, MD [facility]  11/09/2023 10:55 AM Herchel Gloris LABOR, MD [facility]  11/20/2023 10:15 AM Eldonna Suzen Octave, MD [facility]  11/21/2023  3:30 PM [facility] PROVIDER 1 [facility]  11/21/2023  3:45 PM [facility] US2 [facility]  11/23/2023  9:15 AM WMC-EDUCATION [facility]  12/04/2023  1:35 PM Cleatus,  Vina, MD [facility]  12/18/2023  1:15 PM Ilean Norleen GAILS, MD [facility]  12/19/2023  3:30 PM [facility] PROVIDER 1 [facility]  12/19/2023  3:45  PM [facility] US5 [facility]  12/20/2023 10:00 AM [facility] PFT ROOM [facility] [address]  12/20/2023 11:00 AM Kara Dorn NOVAK, MD [facility] [address]  12/26/2023  3:30 PM [facility] PROVIDER 1 [facility]  12/26/2023  3:45 PM [facility] US5 [facility]  01/01/2024  1:15 PM WMC-GENERAL 1 [facility]  01/15/2024  1:15 PM WMC-GENERAL 1 [facility]  01/22/2024  1:15 PM WMC-GENERAL 1 [facility]  01/29/2024  1:15 PM WMC-GENERAL 1 [facility]  02/05/2024  1:15 PM WMC-GENERAL 1 [facility]  02/12/2024  1:15 PM WMC-GENERAL 1 [facility]    Burnard CHRISTELLA Moats, MD

## 2023-10-24 NOTE — Patient Instructions (Signed)

## 2023-10-26 ENCOUNTER — Encounter: Admitting: Obstetrics and Gynecology

## 2023-10-26 LAB — CERVICOVAGINAL ANCILLARY ONLY
Bacterial Vaginitis (gardnerella): NEGATIVE
Candida Glabrata: NEGATIVE
Candida Vaginitis: POSITIVE — AB
Chlamydia: NEGATIVE
Comment: NEGATIVE
Comment: NEGATIVE
Comment: NEGATIVE
Comment: NEGATIVE
Comment: NEGATIVE
Comment: NORMAL
Neisseria Gonorrhea: NEGATIVE
Trichomonas: NEGATIVE

## 2023-10-28 ENCOUNTER — Ambulatory Visit: Payer: Self-pay | Admitting: Obstetrics and Gynecology

## 2023-10-28 MED ORDER — CLOTRIMAZOLE 1 % VA CREA: 1.0000 | TOPICAL_CREAM | Freq: Every day | VAGINAL | 2 refills | Status: AC

## 2023-11-07 ENCOUNTER — Ambulatory Visit (INDEPENDENT_AMBULATORY_CARE_PROVIDER_SITE_OTHER): Admitting: Obstetrics and Gynecology

## 2023-11-07 ENCOUNTER — Other Ambulatory Visit: Payer: Self-pay

## 2023-11-07 VITALS — BP 134/81 | HR 96 | Wt 212.0 lb

## 2023-11-07 DIAGNOSIS — D869 Sarcoidosis, unspecified: Secondary | ICD-10-CM

## 2023-11-07 DIAGNOSIS — O0992 Supervision of high risk pregnancy, unspecified, second trimester: Secondary | ICD-10-CM | POA: Diagnosis not present

## 2023-11-07 DIAGNOSIS — O09522 Supervision of elderly multigravida, second trimester: Secondary | ICD-10-CM

## 2023-11-07 DIAGNOSIS — Z6836 Body mass index (BMI) 36.0-36.9, adult: Secondary | ICD-10-CM

## 2023-11-07 DIAGNOSIS — O0990 Supervision of high risk pregnancy, unspecified, unspecified trimester: Secondary | ICD-10-CM

## 2023-11-07 DIAGNOSIS — O10912 Unspecified pre-existing hypertension complicating pregnancy, second trimester: Secondary | ICD-10-CM | POA: Diagnosis not present

## 2023-11-07 DIAGNOSIS — D563 Thalassemia minor: Secondary | ICD-10-CM

## 2023-11-07 DIAGNOSIS — O99210 Obesity complicating pregnancy, unspecified trimester: Secondary | ICD-10-CM

## 2023-11-07 DIAGNOSIS — B3731 Acute candidiasis of vulva and vagina: Secondary | ICD-10-CM | POA: Diagnosis not present

## 2023-11-07 DIAGNOSIS — Z3A26 26 weeks gestation of pregnancy: Secondary | ICD-10-CM

## 2023-11-07 DIAGNOSIS — A310 Pulmonary mycobacterial infection: Secondary | ICD-10-CM

## 2023-11-07 DIAGNOSIS — O99212 Obesity complicating pregnancy, second trimester: Secondary | ICD-10-CM

## 2023-11-07 DIAGNOSIS — O24410 Gestational diabetes mellitus in pregnancy, diet controlled: Secondary | ICD-10-CM

## 2023-11-07 DIAGNOSIS — Z23 Encounter for immunization: Secondary | ICD-10-CM

## 2023-11-07 MED ORDER — MICONAZOLE NITRATE 2 % VA CREA: 1.0000 | TOPICAL_CREAM | Freq: Every day | VAGINAL | 2 refills | Status: DC

## 2023-11-08 LAB — ANEMIA PROFILE B
Basophils Absolute: 0.1 x10E3/uL (ref 0.0–0.2)
Basos: 0 %
EOS (ABSOLUTE): 0.5 x10E3/uL — ABNORMAL HIGH (ref 0.0–0.4)
Eos: 4 %
Ferritin: 43 ng/mL (ref 15–150)
Folate: >20 ng/mL (ref >3.0)
Hematocrit: 32.9 % — ABNORMAL LOW (ref 34.0–46.6)
Hemoglobin: 10.4 g/dL — ABNORMAL LOW (ref 11.1–15.9)
Immature Grans (Abs): 0.1 x10E3/uL (ref 0.0–0.1)
Immature Granulocytes: 1 %
Iron Saturation: 29 % (ref 15–55)
Iron: 119 ug/dL (ref 27–159)
Lymphocytes Absolute: 2.1 x10E3/uL (ref 0.7–3.1)
Lymphs: 19 %
MCH: 25 pg — ABNORMAL LOW (ref 26.6–33.0)
MCHC: 31.6 g/dL (ref 31.5–35.7)
MCV: 79 fL (ref 79–97)
Monocytes Absolute: 1.2 x10E3/uL — ABNORMAL HIGH (ref 0.1–0.9)
Monocytes: 11 %
Neutrophils Absolute: 7.3 x10E3/uL — ABNORMAL HIGH (ref 1.4–7.0)
Neutrophils: 65 %
Platelets: 376 x10E3/uL (ref 150–450)
RBC: 4.16 x10E6/uL (ref 3.77–5.28)
RDW: 15.9 % — ABNORMAL HIGH (ref 11.7–15.4)
Retic Ct Pct: 2.2 % (ref 0.6–2.6)
Total Iron Binding Capacity: 410 ug/dL (ref 250–450)
UIBC: 291 ug/dL (ref 131–425)
Vitamin B-12: 431 pg/mL (ref 232–1245)
WBC: 11.2 x10E3/uL — ABNORMAL HIGH (ref 3.4–10.8)

## 2023-11-08 LAB — COMPREHENSIVE METABOLIC PANEL WITH GFR
ALT: 18 IU/L (ref 0–32)
AST: 18 IU/L (ref 0–40)
Albumin: 3.9 g/dL (ref 3.9–4.9)
Alkaline Phosphatase: 82 IU/L (ref 41–116)
BUN/Creatinine Ratio: 11 (ref 9–23)
BUN: 10 mg/dL (ref 6–24)
Bilirubin Total: <0.2 mg/dL (ref 0.0–1.2)
CO2: 20 mmol/L (ref 20–29)
Calcium: 9.5 mg/dL (ref 8.7–10.2)
Chloride: 101 mmol/L (ref 96–106)
Creatinine, Ser: 0.88 mg/dL (ref 0.57–1.00)
Globulin, Total: 2.8 g/dL (ref 1.5–4.5)
Glucose: 81 mg/dL (ref 70–99)
Potassium: 4.4 mmol/L (ref 3.5–5.2)
Sodium: 133 mmol/L — ABNORMAL LOW (ref 134–144)
Total Protein: 6.7 g/dL (ref 6.0–8.5)
eGFR: 84 mL/min/1.73 (ref >59)

## 2023-11-08 LAB — HIV ANTIBODY (ROUTINE TESTING W REFLEX): HIV Screen 4th Generation wRfx: NONREACTIVE

## 2023-11-08 LAB — RPR: RPR Ser Ql: NONREACTIVE

## 2023-11-09 ENCOUNTER — Encounter: Admitting: Obstetrics & Gynecology

## 2023-11-09 ENCOUNTER — Ambulatory Visit: Payer: Self-pay | Admitting: Obstetrics and Gynecology

## 2023-11-09 DIAGNOSIS — Z6836 Body mass index (BMI) 36.0-36.9, adult: Secondary | ICD-10-CM | POA: Insufficient documentation

## 2023-11-09 NOTE — Telephone Encounter (Signed)
 I called Sarah Arnold and informed her of results per Dr. Izell  all normal  but he wanted to know if she is taking iron. She reports she is taking otc Iron once a day. I informed her I will let Dr. Izell know. Rock Skip PEAK

## 2023-11-09 NOTE — Progress Notes (Signed)
 PRENATAL VISIT NOTE  Subjective:  Sarah Arnold is a 43 y.o. G7P6006 at [redacted]w[redacted]d being seen today for ongoing prenatal care.  She is currently monitored for the following issues for this high-risk pregnancy and has MAIC (mycobacterium avium-intracellulare complex) (HCC); Sarcoidosis; Class 2 severe obesity with serious comorbidity and body mass index (BMI) of 35.0 to 35.9 in adult; Supervision of high risk pregnancy, antepartum; Pre-existing hypertension affecting pregnancy; GDM (gestational diabetes mellitus); AMA (advanced maternal age) multigravida 35+; Obesity affecting pregnancy, antepartum; Alpha thalassemia silent carrier; Increased Carrier risk for spinal muscular atrophy; Late prenatal care affecting pregnancy in second trimester; and BMI 36.0-36.9,adult on their problem list.  Patient reports vaginal itching.  Contractions: Not present. Vag. Bleeding: None.  Movement: Present. Denies leaking of fluid.   The following portions of the patient's history were reviewed and updated as appropriate: allergies, current medications, past family history, past medical history, past social history, past surgical history and problem list.   Objective:    Vitals:   11/07/23 1623  BP: 134/81  Pulse: 96  Weight: 212 lb (96.2 kg)    Fetal Status:  Fetal Heart Rate (bpm): 150   Movement: Present    General: Alert, oriented and cooperative. Patient is in no acute distress.  Skin: Skin is warm and dry. No rash noted.   Cardiovascular: Normal heart rate noted  Respiratory: Normal respiratory effort, no problems with respiration noted  Abdomen: Soft, gravid, appropriate for gestational age.  Pain/Pressure: Absent     Pelvic: Cervical exam deferred        Extremities: Normal range of motion.  Edema: None  Mental Status: Normal mood and affect. Normal behavior. Normal judgment and thought content.   Assessment and Plan:  Pregnancy: G7P6006 at [redacted]w[redacted]d 1. Supervision of high risk pregnancy,  antepartum (Primary) 28wk labs today - RPR - HIV Antibody (routine testing w rflx) - Anemia Profile B - CBC - Comprehensive metabolic panel with GFR  2. Alpha thalassemia silent carrier - Anemia Profile B  3. Vulvovaginal candidiasis Monistat sent in  4. Pre-existing hypertension affecting pregnancy in second trimester Continue low dose aspirin . Continue labetalol , which patient was on pre-pregnancy  5. Diet controlled gestational diabetes mellitus (GDM) in second trimester AM fasting wnl. 2h postprandials in the 120s and patient states she is watching her diet and post prandial walk. I told her that I recommend starting medications with metformin. Pt would like to wait and see, which I told her is reasonable  6. Sarcoidosis No acute issues. Seen by Pulm this pregnancy in August and plan for follow up in 3 months.   7. MAIC (mycobacterium avium-intracellulare complex) (HCC) See above. Treated in 2015  8. Obesity affecting pregnancy, antepartum, unspecified obesity type  9. BMI 36.0-36.9,adult  10. Multigravida of advanced maternal age in second trimester Follow up mfm growth scans 9/16: efw 44%, ac 59%, afi wnl  Preterm labor symptoms and general obstetric precautions including but not limited to vaginal bleeding, contractions, leaking of fluid and fetal movement were reviewed in detail with the patient. Please refer to After Visit Summary for other counseling recommendations.   No follow-ups on file.  Future Appointments  Date Time Provider Department Center  11/20/2023 10:15 AM Eldonna Suzen Octave, MD [facility]  11/21/2023  3:30 PM [facility] PROVIDER 1 [facility]  11/21/2023  3:45 PM [facility] US2 [facility]  11/23/2023  9:15 AM [facility]  12/04/2023  1:35 PM Cleatus Moccasin, MD [facility]  12/18/2023  1:15 PM  Ilean Norleen GAILS, MD [facility]  12/19/2023  3:30 PM [facility] PROVIDER 1 [facility]  12/19/2023  3:45 PM [facility] US5 [facility]   12/20/2023 10:00 AM [facility] PFT ROOM [facility] [address]  12/20/2023 11:00 AM Kara Dorn NOVAK, MD [facility] [address]  12/26/2023  3:30 PM [facility] PROVIDER 1 [facility]  12/26/2023  3:45 PM [facility] US3 [facility]  01/01/2024  1:15 PM Izell Harari, MD [facility]  01/15/2024  1:15 PM [facility]-GENERAL 1 [facility]  01/22/2024  1:15 PM Zina Jerilynn LABOR, MD [facility]  01/29/2024  1:15 PM [facility]GENERAL 1 [facility]  02/05/2024  1:15 PM [facility]AL 1 [facility]  02/12/2024  1:15 PM [facility]GENERAL 1 [facility]    Harari Izell, MD

## 2023-11-09 NOTE — Telephone Encounter (Signed)
-----   Message from [place] sent at 11/09/2023  9:32 AM EDT ----- All normal. Can you see if she is taking any PO iron pills? thanks ----- Message ----- From: Rebecka Memos Lab Results In Sent: 11/08/2023   7:39 AM EDT To: Bebe Furry, MD

## 2023-11-14 ENCOUNTER — Telehealth: Payer: Self-pay | Admitting: Family Medicine

## 2023-11-14 NOTE — Telephone Encounter (Signed)
 Patient stated she is feeling constipated. Feels like something is coming out of her bottom.

## 2023-11-15 NOTE — Telephone Encounter (Addendum)
 RN attempted to return pt call.  Unable to leave message, voicemail box was full.    [place], RN

## 2023-11-15 NOTE — Progress Notes (Deleted)
 Patient was seen for Gestational Diabetes on *** Start time *** and End time ***  Estimated due date: 02/08/2024;***w***d  Clinical: Medications:  Current Outpatient Medications:    Accu-Chek Softclix Lancets lancets, Dx code 024.419 check glucose four times a day: Fasting, 2 hours after breakfast, 2 hours after lunch and 2 hours after dinner, Disp: 100 each, Rfl: 12   aspirin  EC 81 MG tablet, Take 1 tablet (81 mg total) by mouth daily. Take after 12 weeks for prevention of preeclampsia later in pregnancy, Disp: 300 tablet, Rfl: 2   Blood Glucose Monitoring Suppl (ACCU-CHEK GUIDE) w/Device KIT, 1 kit by Does not apply route in the morning, at noon, in the evening, and at bedtime., Disp: 1 kit, Rfl: 0   ferrous sulfate  325 (65 FE) MG tablet, Take 1 tablet (325 mg total) by mouth 2 (two) times daily with a meal., Disp: 100 tablet, Rfl: 0   glucose blood test strip, Dx code 024.419 check glucose four times a day: Fasting, 2 hours after breakfast, 2 hours after lunch and 2 hours after dinner, Disp: 100 each, Rfl: 12   labetalol  (NORMODYNE ) 100 MG tablet, Take 1 tablet (100 mg total) by mouth 2 (two) times daily., Disp: 60 tablet, Rfl: 1   loratadine  (CLARITIN ) 10 MG tablet, Take 1 tablet (10 mg total) by mouth daily as needed for allergies. (Patient not taking: Reported on 11/07/2023), Disp: 30 tablet, Rfl: 2   miconazole (MONISTAT 7) 2 % vaginal cream, Place 1 Applicatorful vaginally at bedtime. Apply for seven nights, Disp: 30 g, Rfl: 2   polyethylene glycol powder (GLYCOLAX /MIRALAX ) 17 GM/SCOOP powder, Take 17 g by mouth daily as needed for mild constipation. (Patient not taking: Reported on 11/07/2023), Disp: 3350 g, Rfl: 1   Prenatal Vit-Fe Fumarate-FA (MULTIVITAMIN-PRENATAL) 27-0.8 MG TABS tablet, Take 1 tablet by mouth daily at 12 noon., Disp: , Rfl:   Medical History:  Past Medical History:  Diagnosis Date   Abnormal Pap smear    Allergic rhinitis 04/10/2013   Anemia    Cough 02/11/2013    Gastritis 05/14/2013   Healthcare maintenance 02/12/2015   Hypertension    Late prenatal care    Multiple skin nodules 05/15/2014   Pneumococcal pneumonia    Shortness of breath 02/11/2013    Labs:  Lab Results  Component Value Date   HGBA1C 6.3 (H) 09/06/2023    Dietary and Lifestyle History:  09/20/2024 : Pt presents today alone with her blood sugar log sheet for follow up. Pt reports she is walking occasionally. Pt performed a fasting blood sugar in office today with a value of 119 mg/dL. Pt reports she had milk around 4 am last night stating she became hungry. RD encouraged a balanced snacks after testing blood sugar including a bedtime snack was encouraged. Pt reports she has all the supplies she needs to continue to test blood sugar QID. All Pt's questions were answered during this encounter.    Physical Activity: walking occasionally Stress: low Sleep: good  24 hr Recall:  First Meal: bread, milk (122 mg/dL reported as 2 hour post prandial)  Snack:  mango Second meal: beans, rice, chicken, salad, water (121 mg/dL reported as 2 hour post prandial per Pt)  Snack: berries Third meal:  chicken, rice, carrots, milk (120 mg/dL, reported as 2 hour post prandial per Pt) Snack:  none Beverages:  water, 1% lactose free milk  NUTRITION INTERVENTION  Nutrition education (E-1) on the following topics:   Initial Follow-up  [x]  []   Definition of Gestational Diabetes []  [x]  Why dietary management is important in controlling blood glucose []  [x]  Effects each nutrient has on blood glucose levels []  []  Simple carbohydrates vs complex carbohydrates [x]  []  Fluid intake [x]  [x]  Creating a balanced meal plan []  []  Carbohydrate counting  [x]  [x]  When to check blood glucose levels [x]  []  Proper blood glucose monitoring techniques []  [x]  Effect of stress and stress reduction techniques  []  [x]  Exercise effect on blood glucose levels, appropriate exercise during pregnancy []  [x]  Importance  of limiting caffeine and abstaining from alcohol and smoking []  [x]  Medications used for blood sugar control during pregnancy [x]  [x]  Hypoglycemia and rule of 15 []  [x]  Postpartum self care  Patient has a meter prior to visit. Patient is testing pre breakfast and 2 hours after each meal.    Patient instructed to monitor glucose levels: QID FBS: 60 - <= 95 mg/dL; 2 hour: <= 879 mg/dL  Patient received handouts: Blood glucose log Snack ideas for diabetes during pregnancy   Patient will be seen for follow-up: 11/23/2023

## 2023-11-20 ENCOUNTER — Ambulatory Visit: Admitting: Family Medicine

## 2023-11-20 ENCOUNTER — Other Ambulatory Visit: Payer: Self-pay

## 2023-11-20 VITALS — BP 125/79 | HR 89 | Wt 215.4 lb

## 2023-11-20 DIAGNOSIS — O0993 Supervision of high risk pregnancy, unspecified, third trimester: Secondary | ICD-10-CM

## 2023-11-20 DIAGNOSIS — O0932 Supervision of pregnancy with insufficient antenatal care, second trimester: Secondary | ICD-10-CM

## 2023-11-20 DIAGNOSIS — O99210 Obesity complicating pregnancy, unspecified trimester: Secondary | ICD-10-CM

## 2023-11-20 DIAGNOSIS — O24410 Gestational diabetes mellitus in pregnancy, diet controlled: Secondary | ICD-10-CM

## 2023-11-20 DIAGNOSIS — O0990 Supervision of high risk pregnancy, unspecified, unspecified trimester: Secondary | ICD-10-CM

## 2023-11-20 DIAGNOSIS — D869 Sarcoidosis, unspecified: Secondary | ICD-10-CM

## 2023-11-20 DIAGNOSIS — O99213 Obesity complicating pregnancy, third trimester: Secondary | ICD-10-CM

## 2023-11-20 DIAGNOSIS — Z23 Encounter for immunization: Secondary | ICD-10-CM | POA: Diagnosis not present

## 2023-11-20 DIAGNOSIS — O10913 Unspecified pre-existing hypertension complicating pregnancy, third trimester: Secondary | ICD-10-CM

## 2023-11-20 DIAGNOSIS — O0933 Supervision of pregnancy with insufficient antenatal care, third trimester: Secondary | ICD-10-CM

## 2023-11-20 DIAGNOSIS — D509 Iron deficiency anemia, unspecified: Secondary | ICD-10-CM

## 2023-11-20 DIAGNOSIS — O09523 Supervision of elderly multigravida, third trimester: Secondary | ICD-10-CM

## 2023-11-20 DIAGNOSIS — Z3A28 28 weeks gestation of pregnancy: Secondary | ICD-10-CM

## 2023-11-20 MED ORDER — FERROUS SULFATE 325 (65 FE) MG PO TABS: 325.0000 mg | ORAL_TABLET | ORAL | 11 refills | Status: AC

## 2023-11-20 NOTE — Patient Instructions (Addendum)
 You have constipation which is hard stools that are difficult to pass. It is important to have regular bowel movements every 1-3 days that are soft and easy to pass. Hard stools increase your risk of hemorrhoids and are very uncomfortable.   To prevent constipation you can increase the amount of fiber in your diet. Examples of foods with fiber are leafy greens, whole grain breads, oatmeal and other grains.  It is also important to drink at least eight 8oz glass of water everyday.   If you have not has a bowel movement in 4-5 days you made need to clean out your bowel.  This will have establish normal movement through your bowel.    Miralax  Clean out Take 8 capfuls of miralax  in 64 oz of  (zero sugar) gatorade. You can use any fluid that appeals to you (gatorade, water, juice) Continue to drink at least eight 8 oz glasses of water throughout the day You can repeat with another 8 capfuls of miralax  in 64 oz of gatorade if you are not having a large amount of stools You will need to be at home and close to a bathroom for about 8 hours when you do the above as you may need to go to the bathroom frequently.   After you are cleaned out: - Start Colace100mg  twice daily - Start Miralax  once daily - Start a daily fiber supplement like metamucil or citrucel - You can safely use enemas in pregnancy  - if you are having diarrhea you can reduce to Colace once a day or miralax  every other day or a 1/2 capful daily.

## 2023-11-20 NOTE — Progress Notes (Signed)
 PRENATAL VISIT NOTE  Subjective:  Sarah Arnold is a 43 y.o. G7P6006 at [redacted]w[redacted]d being seen today for ongoing prenatal care.  She is currently monitored for the following issues for this high-risk pregnancy and has MAIC (mycobacterium avium-intracellulare complex) (HCC); Sarcoidosis; Class 2 severe obesity with serious comorbidity and body mass index (BMI) of 35.0 to 35.9 in adult; Supervision of high risk pregnancy, antepartum; Pre-existing hypertension affecting pregnancy; GDM (gestational diabetes mellitus); AMA (advanced maternal age) multigravida 35+; Obesity affecting pregnancy, antepartum; Alpha thalassemia silent carrier; Increased Carrier risk for spinal muscular atrophy; Late prenatal care affecting pregnancy in second trimester; and BMI 36.0-36.9,adult on their problem list.  Patient reports no complaints.  Contractions: Not present. Vag. Bleeding: None.  Movement: Present. Denies leaking of fluid.   The following portions of the patient's history were reviewed and updated as appropriate: allergies, current medications, past family history, past medical history, past social history, past surgical history and problem list.   Objective:    Vitals:   11/20/23 1033  BP: 125/79  Pulse: 89  Weight: 215 lb 6.4 oz (97.7 kg)    Fetal Status:  Fetal Heart Rate (bpm): 147   Movement: Present    General: Alert, oriented and cooperative. Patient is in no acute distress.  Skin: Skin is warm and dry. No rash noted.   Cardiovascular: Normal heart rate noted  Respiratory: Normal respiratory effort, no problems with respiration noted  Abdomen: Soft, gravid, appropriate for gestational age.  Pain/Pressure: Absent     Pelvic: Cervical exam deferred        Extremities: Normal range of motion.  Edema: None  Mental Status: Normal mood and affect. Normal behavior. Normal judgment and thought content.   Assessment and Plan:  Pregnancy: G7P6006 at [redacted]w[redacted]d 1. Diet controlled gestational diabetes  mellitus (GDM) in third trimester (Primary) [place]- 88-98 PP 119-124 US  scheduled for 10/28 Was offered medications, discussed starting metformin- wants to wait and see US  results.   2. Pre-existing hypertension affecting pregnancy in third trimester On ASA Continue labetalol   3. Obesity affecting pregnancy, antepartum, unspecified obesity type TWG=12 lb 6.4 oz (5.625 kg)   4. Sarcoidosis Sees pulmonary  5. Supervision of high risk pregnancy, antepartum UTD Vigorous movement Declines   6. Late prenatal care affecting pregnancy in second trimester  7. Multigravida of advanced maternal age in third trimester  Preterm labor symptoms and general obstetric precautions including but not limited to vaginal bleeding, contractions, leaking of fluid and fetal movement were reviewed in detail with the patient. Please refer to After Visit Summary for other counseling recommendations.   Return in about 2 weeks (around 12/04/2023) for Routine prenatal care.  Future Appointments  Date Time Provider Department Center  11/21/2023  3:30 PM [facility] PROVIDER 1 [facility]  11/21/2023  3:45 PM [facility] US2 [facility]  11/23/2023  9:15 AM [facility]EDUCATION [facility]  12/04/2023  1:35 PM Cleatus Moccasin, MD [facility]  12/18/2023  1:15 PM Ilean Norleen GAILS, MD [facility]  12/19/2023  3:30 PM [facility] PROVIDER 1 [facility]  12/19/2023  3:45 PM [facility] US5 [facility]  12/20/2023 10:00 AM [facility] PFT ROOM [facility] [address]  12/20/2023 11:00 AM Kara Dorn NOVAK, MD [facility] [address]  12/26/2023  3:30 PM [facility] PROVIDER 1 [facility]  12/26/2023  3:45 PM [facility] US3 [facility]  01/01/2024  1:15 PM Izell Harari, MD [facility]  01/15/2024  1:15 PM Nicholaus Burnard HERO, MD [facility]  01/22/2024  1:15 PM  Zina Jerilynn LABOR, MD [facility]  01/29/2024  1:15 PM [facility]GENERAL 1 [facility]  02/05/2024  1:15 PM [facility]GENERAL 1 [facility]  02/12/2024  1:15 PM [facility]GENERAL 1 [facility]     Suzen Maryan Masters, MD

## 2023-11-21 ENCOUNTER — Ambulatory Visit (HOSPITAL_BASED_OUTPATIENT_CLINIC_OR_DEPARTMENT_OTHER): Admitting: Maternal & Fetal Medicine

## 2023-11-21 ENCOUNTER — Ambulatory Visit: Attending: Obstetrics and Gynecology

## 2023-11-21 VITALS — BP 107/63

## 2023-11-21 DIAGNOSIS — Z148 Genetic carrier of other disease: Secondary | ICD-10-CM | POA: Diagnosis present

## 2023-11-21 DIAGNOSIS — O09522 Supervision of elderly multigravida, second trimester: Secondary | ICD-10-CM | POA: Insufficient documentation

## 2023-11-21 DIAGNOSIS — O10913 Unspecified pre-existing hypertension complicating pregnancy, third trimester: Secondary | ICD-10-CM

## 2023-11-21 DIAGNOSIS — O10912 Unspecified pre-existing hypertension complicating pregnancy, second trimester: Secondary | ICD-10-CM | POA: Insufficient documentation

## 2023-11-21 DIAGNOSIS — O99212 Obesity complicating pregnancy, second trimester: Secondary | ICD-10-CM | POA: Insufficient documentation

## 2023-11-21 DIAGNOSIS — O0990 Supervision of high risk pregnancy, unspecified, unspecified trimester: Secondary | ICD-10-CM | POA: Diagnosis present

## 2023-11-21 DIAGNOSIS — E669 Obesity, unspecified: Secondary | ICD-10-CM

## 2023-11-21 DIAGNOSIS — Z3A28 28 weeks gestation of pregnancy: Secondary | ICD-10-CM

## 2023-11-21 DIAGNOSIS — D563 Thalassemia minor: Secondary | ICD-10-CM | POA: Diagnosis not present

## 2023-11-21 DIAGNOSIS — O24410 Gestational diabetes mellitus in pregnancy, diet controlled: Secondary | ICD-10-CM

## 2023-11-21 DIAGNOSIS — O10012 Pre-existing essential hypertension complicating pregnancy, second trimester: Secondary | ICD-10-CM | POA: Diagnosis not present

## 2023-11-21 NOTE — Progress Notes (Unsigned)
 Patient information  Patient Name: Sarah Arnold  Patient MRN:   [id number]  Referring practice: MFM Referring Provider: [facility] Department [facility])  Problem List   Patient Active Problem List   Diagnosis Date Noted   BMI 36.0-36.9,adult 11/09/2023   Late prenatal care affecting pregnancy in second trimester 09/06/2023   Alpha thalassemia silent carrier 09/04/2023   Increased Carrier risk for spinal muscular atrophy 09/04/2023   AMA (advanced maternal age) multigravida 35+ 08/30/2023   Obesity affecting pregnancy, antepartum 08/30/2023   Supervision of high risk pregnancy, antepartum 08/28/2023   Pre-existing hypertension affecting pregnancy 08/28/2023   GDM (gestational diabetes mellitus) 08/28/2023   Class 2 severe obesity with serious comorbidity and body mass index (BMI) of 35.0 to 35.9 in adult 01/31/2022   Sarcoidosis 05/15/2014   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    Maternal Fetal medicine Consult  Sarah Arnold is a 43 y.o. G7P6006 at [redacted]w[redacted]d here for ultrasound and consultation. Sarah Arnold is doing well today with no acute concerns. Today we focused on the following:   CHTN on Labetalol : Patient's blood pressure is borderline low today.  I instructed her to discontinue her labetalol  and report back to her doctor within 1 week.  Blood pressure precautions given.  Blood sugars are mostly well-controlled with diet alone.  The patient had time to ask questions that were answered to her satisfaction.  She verbalized understanding and agrees to proceed with the plan below.  Sonographic findings Single intrauterine pregnancy at 28w 5d.  Fetal cardiac activity:  Observed and appears normal. Presentation: Cephalic. Interval fetal anatomy appears normal. Fetal biometry shows the estimated fetal weight at the 18 percentile. Amniotic fluid volume: Within normal limits. MVP: 4.77 cm. Placenta: Posterior.  There are  limitations of prenatal ultrasound such as the inability to detect certain abnormalities due to poor visualization. Various factors such as fetal position, gestational age and maternal body habitus may increase the difficulty in visualizing the fetal anatomy.    Recommendations -Serial growth ultrasounds every 4-6 weeks until delivery -Antenatal testing to start around 32 weeks   Review of Systems: A review of systems was performed and was negative except per HPI   Vitals and Physical Exam    11/21/2023    3:53 PM 11/20/2023   10:33 AM 11/07/2023    4:23 PM  Vitals with BMI  Weight  215 lbs 6 oz 212 lbs  Systolic 107 125 865  Diastolic 63 79 81  Pulse  89 96    Sitting comfortably on the sonogram table Nonlabored breathing Normal rate and rhythm Abdomen is nontender  Past pregnancies OB History  Gravida Para Term Preterm AB Living  7 6 6   6   SAB IAB Ectopic Multiple Live Births      6    # Outcome Date GA Lbr Len/2nd Weight Sex Type Anes PTL Lv  7 Current           6 Term 10/05/12 [redacted]w[redacted]d 16:17 7 lb 14 oz (3.572 kg) M Vag-Spont None  LIV     Birth Comments: within normal limits  5 Term 2012 [redacted]w[redacted]d 04:00 6 lb 10 oz (3.005 kg) F Vag-Spont   LIV  4 Term 2009 [redacted]w[redacted]d 06:00 7 lb 7 oz (3.374 kg) F Vag-Spont   LIV  3 Term 2006 [redacted]w[redacted]d 03:00 7 lb 15 oz (3.6 kg) F Vag-Spont None  LIV  2 Term 2005 [redacted]w[redacted]d 24:00 6 lb 10 oz (3.005 kg) M Vag-Spont   LIV  1 Term 2003 [redacted]w[redacted]d 48:00 6 lb 3 oz (2.807 kg) F Vag-Spont EPI  LIV     I spent 20 minutes reviewing the patients chart, including labs and images as well as counseling the patient about her medical conditions. Greater than 50% of the time was spent in direct face-to-face patient counseling.  Delora Smaller  MFM, [place]   11/21/2023  4:05 PM

## 2023-11-22 ENCOUNTER — Other Ambulatory Visit: Payer: Self-pay | Admitting: *Deleted

## 2023-11-22 DIAGNOSIS — O0933 Supervision of pregnancy with insufficient antenatal care, third trimester: Secondary | ICD-10-CM

## 2023-11-22 DIAGNOSIS — O358XX0 Maternal care for other (suspected) fetal abnormality and damage, not applicable or unspecified: Secondary | ICD-10-CM

## 2023-11-22 DIAGNOSIS — D563 Thalassemia minor: Secondary | ICD-10-CM

## 2023-11-22 DIAGNOSIS — O09523 Supervision of elderly multigravida, third trimester: Secondary | ICD-10-CM

## 2023-11-22 DIAGNOSIS — O24410 Gestational diabetes mellitus in pregnancy, diet controlled: Secondary | ICD-10-CM

## 2023-11-23 ENCOUNTER — Other Ambulatory Visit: Payer: Self-pay

## 2023-11-23 DIAGNOSIS — O24419 Gestational diabetes mellitus in pregnancy, unspecified control: Secondary | ICD-10-CM

## 2023-12-01 ENCOUNTER — Telehealth: Payer: Self-pay

## 2023-12-02 DIAGNOSIS — O99019 Anemia complicating pregnancy, unspecified trimester: Secondary | ICD-10-CM | POA: Insufficient documentation

## 2023-12-02 NOTE — Progress Notes (Unsigned)
 PRENATAL VISIT NOTE  Subjective:  Sarah Arnold is a 43 y.o. G7P6006 at [redacted]w[redacted]d being seen today for ongoing prenatal care.  She is currently monitored for the following issues for this high-risk pregnancy and has MAIC (mycobacterium avium-intracellulare complex) (HCC); Sarcoidosis; Class 2 severe obesity with serious comorbidity and body mass index (BMI) of 35.0 to 35.9 in adult; Supervision of high risk pregnancy, antepartum; Pre-existing hypertension affecting pregnancy; GDM (gestational diabetes mellitus); AMA (advanced maternal age) multigravida 35+; Obesity affecting pregnancy, antepartum; Alpha thalassemia silent carrier; Increased Carrier risk for spinal muscular atrophy; Late prenatal care affecting pregnancy in second trimester; BMI 36.0-36.9,adult; and Anemia of pregnancy on their problem list.  Patient reports no complaints.  Contractions: Irritability. Vag. Bleeding: None.  Movement: Present. Denies leaking of fluid.   The following portions of the patient's history were reviewed and updated as appropriate: allergies, current medications, past family history, past medical history, past social history, past surgical history and problem list.   Objective:   Vitals:   12/04/23 1338  BP: 127/89  Pulse: 90  Weight: 211 lb 12.8 oz (96.1 kg)    Fetal Status:  Fetal Heart Rate (bpm): 147   Movement: Present    General: Alert, oriented and cooperative. Patient is in no acute distress.  Skin: Skin is warm and dry. No rash noted.   Cardiovascular: Normal heart rate noted  Respiratory: Normal respiratory effort, no problems with respiration noted  Abdomen: Soft, gravid, appropriate for gestational age.  Pain/Pressure: Absent     Pelvic: Cervical exam deferred        Extremities: Normal range of motion.  Edema: None  Mental Status: Normal mood and affect. Normal behavior. Normal judgment and thought content.      09/06/2023   10:29 AM 05/22/2023   10:20 AM 01/19/2023    9:50 AM   Depression screen PHQ 2/9  Decreased Interest 0 0 0  Down, Depressed, Hopeless 0 0 0  PHQ - 2 Score 0 0 0  Altered sleeping 0 0 0  Tired, decreased energy 1 0 0  Change in appetite 0 0 0  Feeling bad or failure about yourself  0 0 0  Trouble concentrating 0 0 0  Moving slowly or fidgety/restless 0 0 0  Suicidal thoughts 0 0 0  PHQ-9 Score 1  0  0   Difficult doing work/chores  Not difficult at all Not difficult at all     Data saved with a previous flowsheet row definition        09/06/2023   10:29 AM 05/22/2023   10:21 AM 01/19/2023    9:50 AM 07/06/2022    2:00 PM  GAD 7 : Generalized Anxiety Score  Nervous, Anxious, on Edge 0 0 0 0  Control/stop worrying 0 0 0 0  Worry too much - different things 0 0 0 1  Trouble relaxing 0 0 0 0  Restless 0 0 0 0  Easily annoyed or irritable 0 0 0 0  Afraid - awful might happen 0 0 0 0  Total GAD 7 Score 0 0 0 1  Anxiety Difficulty  Not difficult at all Not difficult at all     Assessment and Plan:  Pregnancy: G7P6006 at [redacted]w[redacted]d 1. Supervision of high risk pregnancy, antepartum (Primary) Routine PNC up to date.  Anemia panel normal. Continue PO iron.   2. Pregnancy with 30 completed weeks gestation  3. Diet controlled gestational diabetes mellitus (GDM) in third trimester Growth 18%ile.  Meds discussed  last visit but pt wanted to await US .   CBG log not brought. Fasting was 89 today per patient report. Encouraged bring log or picture of it.   4. Pre-existing hypertension affecting pregnancy in third trimester Labetalol  100 bid.  Continue ldASA until delivery.   5. Sarcoidosis No interventions while pregnancy. Has pulmonologist.   6. Multigravida of advanced maternal age in third trimester LR nips.   7. Late prenatal care affecting pregnancy in second trimester  Preterm labor symptoms and general obstetric precautions including but not limited to vaginal bleeding, contractions, leaking of fluid and fetal movement were  reviewed in detail with the patient. Please refer to After Visit Summary for other counseling recommendations.   Return in about 2 weeks (around 12/18/2023) for HROB VISIT.  Future Appointments  Date Time Provider Department Center  12/18/2023  1:15 PM Ilean Norleen GAILS, MD [facility]  12/19/2023  3:30 PM [facility] PROVIDER 1 [facility]  12/19/2023  3:45 PM [facility] US5 [facility]  12/20/2023 10:00 AM [facility] PFT ROOM [facility] [address]  12/20/2023 11:00 AM Kara Dorn NOVAK, MD [facility] [address]  12/26/2023  3:30 PM [facility] PROVIDER 1 [facility]  12/26/2023  3:45 PM [facility] US3 [facility]  01/01/2024  1:15 PM Izell Harari, MD [facility]  01/02/2024  3:15 PM [facility] PROVIDER 1 [facility]  01/02/2024  3:30 PM [facility] US4 [facility]  01/09/2024  3:15 PM [facility] PROVIDER 1 [facility]  01/09/2024  3:30 PM [facility] US4 [facility]  01/15/2024  1:15 PM Nicholaus Burnard HERO, MD [facility]  01/16/2024  3:15 PM [facility] PROVIDER 1 [facility]  01/16/2024  3:30 PM [facility] US4 [facility]  01/22/2024  1:15 PM Zina Jerilynn LABOR, MD [facility]  01/29/2024  1:15 PM Jomarie Charlie LABOR, MD [facility]  02/05/2024  1:15 PM Eveline Lynwood MATSU, MD [facility]  02/12/2024  1:15 PM Nicholaus Burnard HERO, MD [facility]    Vina Solian, MD

## 2023-12-04 ENCOUNTER — Ambulatory Visit (INDEPENDENT_AMBULATORY_CARE_PROVIDER_SITE_OTHER): Admitting: Obstetrics and Gynecology

## 2023-12-04 ENCOUNTER — Other Ambulatory Visit: Payer: Self-pay

## 2023-12-04 ENCOUNTER — Encounter: Payer: Self-pay | Admitting: Obstetrics and Gynecology

## 2023-12-04 VITALS — BP 127/89 | HR 90 | Wt 211.8 lb

## 2023-12-04 DIAGNOSIS — O99013 Anemia complicating pregnancy, third trimester: Secondary | ICD-10-CM

## 2023-12-04 DIAGNOSIS — O24410 Gestational diabetes mellitus in pregnancy, diet controlled: Secondary | ICD-10-CM | POA: Diagnosis not present

## 2023-12-04 DIAGNOSIS — O09523 Supervision of elderly multigravida, third trimester: Secondary | ICD-10-CM

## 2023-12-04 DIAGNOSIS — O0933 Supervision of pregnancy with insufficient antenatal care, third trimester: Secondary | ICD-10-CM

## 2023-12-04 DIAGNOSIS — Z3A30 30 weeks gestation of pregnancy: Secondary | ICD-10-CM

## 2023-12-04 DIAGNOSIS — O10913 Unspecified pre-existing hypertension complicating pregnancy, third trimester: Secondary | ICD-10-CM

## 2023-12-04 DIAGNOSIS — O0990 Supervision of high risk pregnancy, unspecified, unspecified trimester: Secondary | ICD-10-CM

## 2023-12-04 DIAGNOSIS — O0932 Supervision of pregnancy with insufficient antenatal care, second trimester: Secondary | ICD-10-CM

## 2023-12-04 DIAGNOSIS — O99019 Anemia complicating pregnancy, unspecified trimester: Secondary | ICD-10-CM

## 2023-12-04 DIAGNOSIS — O0993 Supervision of high risk pregnancy, unspecified, third trimester: Secondary | ICD-10-CM | POA: Diagnosis not present

## 2023-12-04 DIAGNOSIS — D869 Sarcoidosis, unspecified: Secondary | ICD-10-CM

## 2023-12-15 ENCOUNTER — Ambulatory Visit: Admitting: Pulmonary Disease

## 2023-12-15 ENCOUNTER — Encounter

## 2023-12-18 ENCOUNTER — Encounter: Admitting: Family Medicine

## 2023-12-19 ENCOUNTER — Ambulatory Visit: Attending: Obstetrics and Gynecology

## 2023-12-19 ENCOUNTER — Ambulatory Visit (HOSPITAL_BASED_OUTPATIENT_CLINIC_OR_DEPARTMENT_OTHER): Admitting: Obstetrics and Gynecology

## 2023-12-19 ENCOUNTER — Ambulatory Visit

## 2023-12-19 VITALS — BP 112/69 | HR 96 | Wt 212.6 lb

## 2023-12-19 VITALS — BP 114/63

## 2023-12-19 DIAGNOSIS — O10912 Unspecified pre-existing hypertension complicating pregnancy, second trimester: Secondary | ICD-10-CM | POA: Diagnosis present

## 2023-12-19 DIAGNOSIS — O0990 Supervision of high risk pregnancy, unspecified, unspecified trimester: Secondary | ICD-10-CM | POA: Diagnosis not present

## 2023-12-19 DIAGNOSIS — O09523 Supervision of elderly multigravida, third trimester: Secondary | ICD-10-CM

## 2023-12-19 DIAGNOSIS — O09522 Supervision of elderly multigravida, second trimester: Secondary | ICD-10-CM | POA: Insufficient documentation

## 2023-12-19 DIAGNOSIS — O10013 Pre-existing essential hypertension complicating pregnancy, third trimester: Secondary | ICD-10-CM

## 2023-12-19 DIAGNOSIS — O0993 Supervision of high risk pregnancy, unspecified, third trimester: Secondary | ICD-10-CM | POA: Diagnosis not present

## 2023-12-19 DIAGNOSIS — O24410 Gestational diabetes mellitus in pregnancy, diet controlled: Secondary | ICD-10-CM

## 2023-12-19 DIAGNOSIS — Z148 Genetic carrier of other disease: Secondary | ICD-10-CM

## 2023-12-19 DIAGNOSIS — E669 Obesity, unspecified: Secondary | ICD-10-CM | POA: Diagnosis not present

## 2023-12-19 DIAGNOSIS — O99013 Anemia complicating pregnancy, third trimester: Secondary | ICD-10-CM | POA: Diagnosis not present

## 2023-12-19 DIAGNOSIS — D563 Thalassemia minor: Secondary | ICD-10-CM | POA: Diagnosis present

## 2023-12-19 DIAGNOSIS — Z3A32 32 weeks gestation of pregnancy: Secondary | ICD-10-CM | POA: Insufficient documentation

## 2023-12-19 DIAGNOSIS — O10913 Unspecified pre-existing hypertension complicating pregnancy, third trimester: Secondary | ICD-10-CM | POA: Insufficient documentation

## 2023-12-19 DIAGNOSIS — O99213 Obesity complicating pregnancy, third trimester: Secondary | ICD-10-CM

## 2023-12-19 DIAGNOSIS — O0943 Supervision of pregnancy with grand multiparity, third trimester: Secondary | ICD-10-CM | POA: Diagnosis not present

## 2023-12-19 DIAGNOSIS — O99212 Obesity complicating pregnancy, second trimester: Secondary | ICD-10-CM | POA: Insufficient documentation

## 2023-12-19 NOTE — Progress Notes (Signed)
 After review, MFM consult with provider is not indicated for today  Arna Ranks, MD 12/19/2023 4:48 PM  [facility]

## 2023-12-19 NOTE — Progress Notes (Signed)
 PRENATAL VISIT NOTE  Subjective:  Sarah Arnold is a 43 y.o. G7P6006 at [redacted]w[redacted]d being seen today for ongoing prenatal care.  She is currently monitored for the following issues for this high-risk pregnancy and has MAIC (mycobacterium avium-intracellulare complex) (HCC); Sarcoidosis; Class 2 severe obesity with serious comorbidity and body mass index (BMI) of 35.0 to 35.9 in adult; Supervision of high risk pregnancy, antepartum; Pre-existing hypertension affecting pregnancy; GDM (gestational diabetes mellitus); AMA (advanced maternal age) multigravida 35+; Obesity affecting pregnancy, antepartum; Alpha thalassemia silent carrier; Increased Carrier risk for spinal muscular atrophy; Late prenatal care affecting pregnancy in second trimester; BMI 36.0-36.9,adult; and Anemia of pregnancy on their problem list.  Patient reports no bleeding, no contractions, no leaking, and normal fetal movement.  Contractions: Not present. Vag. Bleeding: None.  Movement: Present. Denies leaking of fluid. Taking iron and PNV daily.   The following portions of the patient's history were reviewed and updated as appropriate: allergies, current medications, past family history, past medical history, past social history, past surgical history and problem list.   Objective:    Vitals:   12/19/23 1347  BP: 112/69  Pulse: 96  Weight: 212 lb 9 oz (96.4 kg)    Fetal Status:  Fetal Heart Rate (bpm): 158 Fundal Height: 32 cm Movement: Present    General: Alert, oriented and cooperative. Patient is in no acute distress.  Skin: Skin is warm and dry. No rash noted.   Cardiovascular: Normal heart rate noted  Respiratory: Normal respiratory effort, no problems with respiration noted  Abdomen: Soft, gravid, appropriate for gestational age.  Pain/Pressure: Present (lower back pain)     Pelvic: Cervical exam deferred        Extremities: Normal range of motion.  Edema: None  Mental Status: Normal mood and affect. Normal  behavior. Normal judgment and thought content.   Assessment and Plan:  Pregnancy: G7P6006 at [redacted]w[redacted]d  1. Diet controlled gestational diabetes mellitus (GDM) in third trimester Fasting 14/14, PP mostly uncontrolled in 120-125 range (26/40 out of range); eating spinach, rice, drinking lots of whole milk with every meal. Advised to drink meal with only breakfast Provided new log  2. Supervision of high risk pregnancy, antepartum (Primary) S=D, taking PNV daily  3. Multigravida of advanced maternal age in third trimester No concerns at this time  4. Anemia during pregnancy in third trimester Taking iron daily as prescribed   Preterm labor symptoms and general obstetric precautions including but not limited to vaginal bleeding, contractions, leaking of fluid and fetal movement were reviewed in detail with the patient. Please refer to After Visit Summary for other counseling recommendations.     Return in about 2 weeks (around 01/02/2024) for Routine prenatal visit.  Future Appointments  Date Time Provider Department Center  12/20/2023 10:00 AM [facility] RM [facility] [address]  12/20/2023 11:00 AM Kara Dorn NOVAK, MD [facility] [address]  12/26/2023  3:30 PM [facility] PROVIDER 1 [facility]  12/26/2023  3:45 PM [facility] US6 [facility]  01/01/2024  1:15 PM Hoyle Garre, NP [facility]  01/02/2024  3:15 PM [facility] PROVIDER 1 [facility]  01/02/2024  3:30 PM [facility] US4 [facility]  01/09/2024  3:15 PM [facility] PROVIDER 1 [facility]  01/09/2024  3:30 PM [facility] US4 [facility]  01/15/2024  1:15 PM Nicholaus Burnard HERO, MD [facility]  01/16/2024  3:15 PM [facility] PROVIDER 1 [facility]  01/16/2024  3:30 PM [facility] US4 [facility]  01/22/2024  1:15 PM Zina Satterfield  A, MD [facility]  01/29/2024  1:15 PM Jomarie Charlie LABOR, MD [facility]  02/05/2024  1:15 PM Eveline Lynwood MATSU, MD [facility]  02/12/2024  1:15 PM Nicholaus Burnard HERO, MD [facility]    Charlie LABOR Jomarie, MD

## 2023-12-20 ENCOUNTER — Encounter: Payer: Self-pay | Admitting: Pulmonary Disease

## 2023-12-20 ENCOUNTER — Encounter

## 2023-12-20 ENCOUNTER — Ambulatory Visit: Admitting: Pulmonary Disease

## 2023-12-20 NOTE — Progress Notes (Deleted)
   Established Patient Pulmonology Office Visit   Subjective:  Patient ID: Belissa Kooy, female    DOB: 03-20-1980  MRN: [id number]  CC: No chief complaint on file.   Discussed the use of AI scribe software for clinical note transcription with the patient, who gave verbal consent to proceed.  History of Present Illness       {PULM QUESTIONNAIRES (Optional):33196}  ROS  {History (Optional):23778}  Current Outpatient Medications:    Accu-Chek Softclix Lancets lancets, Dx code 024.419 check glucose four times a day: Fasting, 2 hours after breakfast, 2 hours after lunch and 2 hours after dinner, Disp: 100 each, Rfl: 12   aspirin  EC 81 MG tablet, Take 1 tablet (81 mg total) by mouth daily. Take after 12 weeks for prevention of preeclampsia later in pregnancy, Disp: 300 tablet, Rfl: 2   Blood Glucose Monitoring Suppl (ACCU-CHEK GUIDE) w/Device KIT, 1 kit by Does not apply route in the morning, at noon, in the evening, and at bedtime., Disp: 1 kit, Rfl: 0   ferrous sulfate  325 (65 FE) MG tablet, Take 1 tablet (325 mg total) by mouth every other day., Disp: 30 tablet, Rfl: 11   glucose blood test strip, Dx code 024.419 check glucose four times a day: Fasting, 2 hours after breakfast, 2 hours after lunch and 2 hours after dinner, Disp: 100 each, Rfl: 12   labetalol  (NORMODYNE ) 100 MG tablet, Take 1 tablet (100 mg total) by mouth 2 (two) times daily., Disp: 60 tablet, Rfl: 1   polyethylene glycol powder (GLYCOLAX /MIRALAX ) 17 GM/SCOOP powder, Take 17 g by mouth daily as needed for mild constipation., Disp: 3350 g, Rfl: 1   Prenatal Vit-Fe Fumarate-FA (MULTIVITAMIN-PRENATAL) 27-0.8 MG TABS tablet, Take 1 tablet by mouth daily at 12 noon., Disp: , Rfl:       Objective:  LMP 03/27/2023 (Approximate)   {Pulm Vitals (Optional):32837}  Physical Exam   Diagnostic Review:  {Labs (Optional):32838}     Assessment & Plan:   Assessment & Plan   Assessment and Plan Assessment &  Plan       No follow-ups on file.   Dorn KATHEE Chill, MD

## 2023-12-26 ENCOUNTER — Ambulatory Visit: Admitting: Obstetrics

## 2023-12-26 ENCOUNTER — Ambulatory Visit: Attending: Obstetrics and Gynecology

## 2023-12-26 VITALS — BP 140/69

## 2023-12-26 DIAGNOSIS — O10912 Unspecified pre-existing hypertension complicating pregnancy, second trimester: Secondary | ICD-10-CM | POA: Diagnosis present

## 2023-12-26 DIAGNOSIS — O09522 Supervision of elderly multigravida, second trimester: Secondary | ICD-10-CM | POA: Insufficient documentation

## 2023-12-26 DIAGNOSIS — O0943 Supervision of pregnancy with grand multiparity, third trimester: Secondary | ICD-10-CM

## 2023-12-26 DIAGNOSIS — Z3A33 33 weeks gestation of pregnancy: Secondary | ICD-10-CM

## 2023-12-26 DIAGNOSIS — O24410 Gestational diabetes mellitus in pregnancy, diet controlled: Secondary | ICD-10-CM | POA: Insufficient documentation

## 2023-12-26 DIAGNOSIS — O10913 Unspecified pre-existing hypertension complicating pregnancy, third trimester: Secondary | ICD-10-CM | POA: Diagnosis not present

## 2023-12-26 DIAGNOSIS — D563 Thalassemia minor: Secondary | ICD-10-CM

## 2023-12-26 DIAGNOSIS — O0990 Supervision of high risk pregnancy, unspecified, unspecified trimester: Secondary | ICD-10-CM | POA: Diagnosis present

## 2023-12-26 DIAGNOSIS — O99212 Obesity complicating pregnancy, second trimester: Secondary | ICD-10-CM | POA: Diagnosis present

## 2023-12-26 DIAGNOSIS — Z148 Genetic carrier of other disease: Secondary | ICD-10-CM | POA: Insufficient documentation

## 2023-12-26 DIAGNOSIS — O09523 Supervision of elderly multigravida, third trimester: Secondary | ICD-10-CM | POA: Insufficient documentation

## 2023-12-26 NOTE — Progress Notes (Signed)
 MFM Consult Note  Sarah Arnold is currently at [redacted]w[redacted]d. She has been followed due to advanced maternal age (43 years old), maternal obesity, chronic hypertension treated with labetalol , diet-controlled gestational diabetes, and grand multiparity.    She reports that her fingerstick values remain mostly within normal limits.  Her blood pressure today was 140/69.  Sonographic findings Single intrauterine pregnancy at 33w 5d. Fetal cardiac activity: Observed. Presentation: Cephalic. Amniotic fluid: Within normal limits. AFI: 10.72 cm,  MVP: 3.7 cm. Placenta: Posterior. BPP: 8/8.   Due to her underlying medical conditions, we will continue to follow her with weekly fetal testing until delivery.    Delivery may be considered at between 37 to 38 weeks.  She will return in 1 week for a BPP.  The patient stated that all of her questions were answered.   A total of 20 minutes was spent counseling and coordinating the care for this patient.  Greater than 50% of the time was spent in direct face-to-face contact.

## 2024-01-01 ENCOUNTER — Other Ambulatory Visit (HOSPITAL_COMMUNITY)
Admission: RE | Admit: 2024-01-01 | Discharge: 2024-01-01 | Disposition: A | Source: Ambulatory Visit | Attending: Obstetrics and Gynecology | Admitting: Obstetrics and Gynecology

## 2024-01-01 ENCOUNTER — Ambulatory Visit: Admitting: Student

## 2024-01-01 ENCOUNTER — Other Ambulatory Visit: Payer: Self-pay

## 2024-01-01 VITALS — Wt 218.1 lb

## 2024-01-01 DIAGNOSIS — Z3A34 34 weeks gestation of pregnancy: Secondary | ICD-10-CM

## 2024-01-01 DIAGNOSIS — O24410 Gestational diabetes mellitus in pregnancy, diet controlled: Secondary | ICD-10-CM

## 2024-01-01 DIAGNOSIS — O99013 Anemia complicating pregnancy, third trimester: Secondary | ICD-10-CM | POA: Diagnosis not present

## 2024-01-01 DIAGNOSIS — O0993 Supervision of high risk pregnancy, unspecified, third trimester: Secondary | ICD-10-CM

## 2024-01-01 DIAGNOSIS — O0990 Supervision of high risk pregnancy, unspecified, unspecified trimester: Secondary | ICD-10-CM

## 2024-01-01 DIAGNOSIS — N898 Other specified noninflammatory disorders of vagina: Secondary | ICD-10-CM | POA: Diagnosis not present

## 2024-01-01 DIAGNOSIS — O09523 Supervision of elderly multigravida, third trimester: Secondary | ICD-10-CM

## 2024-01-01 DIAGNOSIS — Z6836 Body mass index (BMI) 36.0-36.9, adult: Secondary | ICD-10-CM

## 2024-01-01 DIAGNOSIS — O10913 Unspecified pre-existing hypertension complicating pregnancy, third trimester: Secondary | ICD-10-CM

## 2024-01-01 NOTE — Progress Notes (Signed)
 PRENATAL VISIT NOTE  Subjective:  Sarah Arnold is a 43 y.o. G7P6006 at [redacted]w[redacted]d being seen today for ongoing prenatal care.  She is currently monitored for the following issues for this high-risk pregnancy and has MAIC (mycobacterium avium-intracellulare complex) (HCC); Sarcoidosis; Class 2 severe obesity with serious comorbidity and body mass index (BMI) of 35.0 to 35.9 in adult; Supervision of high risk pregnancy, antepartum; Pre-existing hypertension affecting pregnancy; GDM (gestational diabetes mellitus); AMA (advanced maternal age) multigravida 35+; Obesity affecting pregnancy, antepartum; Alpha thalassemia silent carrier; Increased Carrier risk for spinal muscular atrophy; Late prenatal care affecting pregnancy in second trimester; BMI 36.0-36.9,adult; and Anemia of pregnancy on their problem list.  Patient reports one episode of very mild spotting this morning and new onset of vaginal itching. Reports bleeding smaller that the size of a quater.  Contractions: Irritability. Vag. Bleeding: None.  Movement: Present. Denies leaking of fluid.   The following portions of the patient's history were reviewed and updated as appropriate: allergies, current medications, past family history, past medical history, past social history, past surgical history and problem list.   Objective:   Vitals:   01/01/24 1331  Weight: 218 lb 1.6 oz (98.9 kg)    Fetal Status:      Movement: Present    General: Alert, oriented and cooperative. Patient is in no acute distress.  Skin: Skin is warm and dry. No rash noted.   Cardiovascular: Normal heart rate noted  Respiratory: Normal respiratory effort, no problems with respiration noted  Abdomen: Soft, gravid, appropriate for gestational age.  Pain/Pressure: Present     Pelvic: Cervical exam deferred        Extremities: Normal range of motion.  Edema: Trace  Mental Status: Normal mood and affect. Normal behavior. Normal judgment and thought content.       09/06/2023   10:29 AM 05/22/2023   10:20 AM 01/19/2023    9:50 AM  Depression screen PHQ 2/9  Decreased Interest 0 0 0  Down, Depressed, Hopeless 0 0 0  PHQ - 2 Score 0 0 0  Altered sleeping 0 0 0  Tired, decreased energy 1 0 0  Change in appetite 0 0 0  Feeling bad or failure about yourself  0 0 0  Trouble concentrating 0 0 0  Moving slowly or fidgety/restless 0 0 0  Suicidal thoughts 0 0 0  PHQ-9 Score 1  0  0   Difficult doing work/chores  Not difficult at all Not difficult at all     Data saved with a previous flowsheet row definition        09/06/2023   10:29 AM 05/22/2023   10:21 AM 01/19/2023    9:50 AM 07/06/2022    2:00 PM  GAD 7 : Generalized Anxiety Score  Nervous, Anxious, on Edge 0 0 0 0  Control/stop worrying 0 0 0 0  Worry too much - different things 0 0 0 1  Trouble relaxing 0 0 0 0  Restless 0 0 0 0  Easily annoyed or irritable 0 0 0 0  Afraid - awful might happen 0 0 0 0  Total GAD 7 Score 0 0 0 1  Anxiety Difficulty  Not difficult at all Not difficult at all     Assessment and Plan:  Pregnancy: G7P6006 at [redacted]w[redacted]d 1. Supervision of high risk pregnancy, antepartum (Primary) -  Frequent fetal movement noted  2. [redacted] weeks gestation of pregnancy - Continue routine care   3. Anemia during pregnancy in third  trimester - PO iron therapy  4. Multigravida of advanced maternal age in third trimester - LR genetic counseling provided   5. Diet controlled gestational diabetes mellitus (GDM) in third trimester - Blood Sugars are within normal range except 4-5 PP readings within the past two weeks. Highest reading is 125 mg/dL.  - Patient encouraged to continue monitoring diet adjustments - Discussed MFM recommendation for delivery at 37-39 weeks depending on antenatal testing  6. Pre-existing hypertension affecting pregnancy in third trimester - taking BP at home, and all have been normal - taking Labetalol  daily  7. BMI 36.0-36.9,adult - follow-up growth  scan is scheduled   Preterm labor symptoms and general obstetric precautions including but not limited to vaginal bleeding, contractions, leaking of fluid and fetal movement were reviewed in detail with the patient. Please refer to After Visit Summary for other counseling recommendations.   Return in about 2 weeks (around 01/15/2024) for [facility] IN-PERSON.  Future Appointments  Date Time Provider Department Center  01/02/2024  3:15 PM [facility] PROVIDER 1 [facility]  01/02/2024  3:30 PM [facility] US4 [facility]  01/09/2024  3:15 PM [facility] PROVIDER 1 [facility]  01/09/2024  3:30 PM [facility] US4 [facility]  01/15/2024  1:15 PM Nicholaus Burnard HERO, MD [facility]  01/16/2024  3:15 PM [facility] PROVIDER 1 [facility]  01/16/2024  3:30 PM [facility] US4 [facility]  01/22/2024  1:15 PM Zina Jerilynn LABOR, MD [facility]  01/29/2024  1:15 PM Jomarie Charlie LABOR, MD [facility]  02/05/2024  1:15 PM Eveline Lynwood MATSU, MD [facility]  02/12/2024  1:15 PM Nicholaus Burnard HERO, MD [facility]    Nat Dauer, NP

## 2024-01-02 ENCOUNTER — Ambulatory Visit: Attending: Maternal & Fetal Medicine

## 2024-01-02 ENCOUNTER — Ambulatory Visit: Admitting: Obstetrics and Gynecology

## 2024-01-02 VITALS — BP 126/64

## 2024-01-02 DIAGNOSIS — O358XX0 Maternal care for other (suspected) fetal abnormality and damage, not applicable or unspecified: Secondary | ICD-10-CM

## 2024-01-02 DIAGNOSIS — O24410 Gestational diabetes mellitus in pregnancy, diet controlled: Secondary | ICD-10-CM

## 2024-01-02 DIAGNOSIS — D563 Thalassemia minor: Secondary | ICD-10-CM

## 2024-01-02 DIAGNOSIS — O0990 Supervision of high risk pregnancy, unspecified, unspecified trimester: Secondary | ICD-10-CM

## 2024-01-02 DIAGNOSIS — O09523 Supervision of elderly multigravida, third trimester: Secondary | ICD-10-CM

## 2024-01-02 DIAGNOSIS — O10913 Unspecified pre-existing hypertension complicating pregnancy, third trimester: Secondary | ICD-10-CM

## 2024-01-02 DIAGNOSIS — O0933 Supervision of pregnancy with insufficient antenatal care, third trimester: Secondary | ICD-10-CM

## 2024-01-02 DIAGNOSIS — Z148 Genetic carrier of other disease: Secondary | ICD-10-CM

## 2024-01-02 DIAGNOSIS — Z3A34 34 weeks gestation of pregnancy: Secondary | ICD-10-CM

## 2024-01-02 LAB — CERVICOVAGINAL ANCILLARY ONLY
Bacterial Vaginitis (gardnerella): POSITIVE — AB
Candida Glabrata: NEGATIVE
Candida Vaginitis: POSITIVE — AB
Comment: NEGATIVE
Comment: NEGATIVE
Comment: NEGATIVE

## 2024-01-02 NOTE — Progress Notes (Signed)
 Maternal-Fetal Medicine Consultation  Name: Sarah Arnold  MRN: [id number]  GA: [id number] [redacted]w[redacted]d   -Chronic hypertension.  Patient takes labetalol  and her blood pressure today at our office is 106/64 mmHg.  She does not have signs and symptoms of severe features of preeclampsia.  - Gestational diabetes.  Reportedly well-controlled on diet.  Fasting levels are below 98 mg/dL and postprandial levels are reportedly within normal range. - Advanced maternal age (44 years at delivery).  Ultrasound Normal amniotic fluid.  Cephalic presentation.  Antenatal testing is reassuring.  BPP 8/8. Patient had questions about timing of delivery today.  In pregnancies with chronic hypertension, preeclampsia can complicate up to 50% after [redacted] weeks gestation.  Given that she has co-morbid conditions of diabetes and hypertension, delivery at [redacted] weeks gestation is reasonable.  Perinatal mortality is slightly increased with hypertension and diabetes. Obstetrical history is significant for 6 vaginal deliveries. Patient opted to deliver at [redacted] weeks gestation.  Recommendations - Continue weekly antenatal testing till delivery. - Delivery at [redacted] weeks gestation.     Consultation including face-to-face (more than 50%) counseling 20 minutes.

## 2024-01-04 ENCOUNTER — Ambulatory Visit: Payer: Self-pay | Admitting: Student

## 2024-01-04 DIAGNOSIS — N898 Other specified noninflammatory disorders of vagina: Secondary | ICD-10-CM

## 2024-01-04 DIAGNOSIS — B3731 Acute candidiasis of vulva and vagina: Secondary | ICD-10-CM

## 2024-01-04 DIAGNOSIS — B9689 Other specified bacterial agents as the cause of diseases classified elsewhere: Secondary | ICD-10-CM

## 2024-01-04 MED ORDER — MICONAZOLE NITRATE 2 % VA CREA: 1.0000 | TOPICAL_CREAM | Freq: Every day | VAGINAL | 2 refills | Status: DC

## 2024-01-04 MED ORDER — METRONIDAZOLE 500 MG PO TABS: 500.0000 mg | ORAL_TABLET | Freq: Two times a day (BID) | ORAL | 0 refills | Status: DC

## 2024-01-09 ENCOUNTER — Ambulatory Visit: Admitting: Maternal & Fetal Medicine

## 2024-01-09 ENCOUNTER — Ambulatory Visit: Attending: Maternal & Fetal Medicine

## 2024-01-09 VITALS — BP 142/80 | HR 93

## 2024-01-09 VITALS — BP 148/72

## 2024-01-09 DIAGNOSIS — O358XX0 Maternal care for other (suspected) fetal abnormality and damage, not applicable or unspecified: Secondary | ICD-10-CM

## 2024-01-09 DIAGNOSIS — O0933 Supervision of pregnancy with insufficient antenatal care, third trimester: Secondary | ICD-10-CM | POA: Insufficient documentation

## 2024-01-09 DIAGNOSIS — O10013 Pre-existing essential hypertension complicating pregnancy, third trimester: Secondary | ICD-10-CM | POA: Diagnosis not present

## 2024-01-09 DIAGNOSIS — O10913 Unspecified pre-existing hypertension complicating pregnancy, third trimester: Secondary | ICD-10-CM | POA: Diagnosis present

## 2024-01-09 DIAGNOSIS — D563 Thalassemia minor: Secondary | ICD-10-CM

## 2024-01-09 DIAGNOSIS — Z148 Genetic carrier of other disease: Secondary | ICD-10-CM | POA: Diagnosis present

## 2024-01-09 DIAGNOSIS — Z3A35 35 weeks gestation of pregnancy: Secondary | ICD-10-CM

## 2024-01-09 DIAGNOSIS — O0990 Supervision of high risk pregnancy, unspecified, unspecified trimester: Secondary | ICD-10-CM | POA: Insufficient documentation

## 2024-01-09 DIAGNOSIS — O24410 Gestational diabetes mellitus in pregnancy, diet controlled: Secondary | ICD-10-CM

## 2024-01-09 DIAGNOSIS — O09523 Supervision of elderly multigravida, third trimester: Secondary | ICD-10-CM | POA: Diagnosis present

## 2024-01-09 DIAGNOSIS — O0943 Supervision of pregnancy with grand multiparity, third trimester: Secondary | ICD-10-CM | POA: Diagnosis not present

## 2024-01-15 ENCOUNTER — Other Ambulatory Visit (HOSPITAL_COMMUNITY)
Admission: RE | Admit: 2024-01-15 | Discharge: 2024-01-15 | Disposition: A | Discharge status: Home / Self Care | Source: Ambulatory Visit | Attending: Obstetrics and Gynecology | Admitting: Obstetrics and Gynecology

## 2024-01-15 ENCOUNTER — Other Ambulatory Visit: Payer: Self-pay

## 2024-01-15 ENCOUNTER — Encounter (HOSPITAL_COMMUNITY): Payer: Self-pay

## 2024-01-15 ENCOUNTER — Ambulatory Visit: Admitting: Obstetrics and Gynecology

## 2024-01-15 ENCOUNTER — Telehealth (HOSPITAL_COMMUNITY): Payer: Self-pay | Admitting: *Deleted

## 2024-01-15 ENCOUNTER — Encounter: Payer: Self-pay | Admitting: Obstetrics and Gynecology

## 2024-01-15 VITALS — BP 107/72 | HR 98 | Wt 217.2 lb

## 2024-01-15 DIAGNOSIS — D869 Sarcoidosis, unspecified: Secondary | ICD-10-CM

## 2024-01-15 DIAGNOSIS — E66812 Obesity, class 2: Secondary | ICD-10-CM | POA: Diagnosis not present

## 2024-01-15 DIAGNOSIS — Z3493 Encounter for supervision of normal pregnancy, unspecified, third trimester: Secondary | ICD-10-CM | POA: Insufficient documentation

## 2024-01-15 DIAGNOSIS — O10913 Unspecified pre-existing hypertension complicating pregnancy, third trimester: Secondary | ICD-10-CM | POA: Diagnosis not present

## 2024-01-15 DIAGNOSIS — O09523 Supervision of elderly multigravida, third trimester: Secondary | ICD-10-CM

## 2024-01-15 DIAGNOSIS — O24410 Gestational diabetes mellitus in pregnancy, diet controlled: Secondary | ICD-10-CM

## 2024-01-15 DIAGNOSIS — Z3A36 36 weeks gestation of pregnancy: Secondary | ICD-10-CM

## 2024-01-15 DIAGNOSIS — Z6836 Body mass index (BMI) 36.0-36.9, adult: Secondary | ICD-10-CM | POA: Diagnosis not present

## 2024-01-15 DIAGNOSIS — O0990 Supervision of high risk pregnancy, unspecified, unspecified trimester: Secondary | ICD-10-CM

## 2024-01-15 DIAGNOSIS — D563 Thalassemia minor: Secondary | ICD-10-CM | POA: Diagnosis not present

## 2024-01-15 DIAGNOSIS — O0993 Supervision of high risk pregnancy, unspecified, third trimester: Secondary | ICD-10-CM

## 2024-01-15 DIAGNOSIS — Z6835 Body mass index (BMI) 35.0-35.9, adult: Secondary | ICD-10-CM | POA: Diagnosis not present

## 2024-01-15 DIAGNOSIS — O99213 Obesity complicating pregnancy, third trimester: Secondary | ICD-10-CM | POA: Diagnosis not present

## 2024-01-15 DIAGNOSIS — O99013 Anemia complicating pregnancy, third trimester: Secondary | ICD-10-CM | POA: Diagnosis not present

## 2024-01-15 DIAGNOSIS — O99210 Obesity complicating pregnancy, unspecified trimester: Secondary | ICD-10-CM

## 2024-01-15 NOTE — Addendum Note (Signed)
 Addended by: LENNIE RONCO T on: 01/15/2024 03:31 PM   Modules accepted: Orders

## 2024-01-15 NOTE — Telephone Encounter (Signed)
 Preadmission screen

## 2024-01-15 NOTE — Progress Notes (Signed)
 "  PRENATAL VISIT NOTE  Subjective:  Sarah Arnold is a 43 y.o. G7P6006 at [redacted]w[redacted]d being seen today for ongoing prenatal care.  She is currently monitored for the following issues for this high-risk pregnancy and has MAIC (mycobacterium avium-intracellulare complex) (HCC); Sarcoidosis; Class 2 severe obesity with serious comorbidity and body mass index (BMI) of 35.0 to 35.9 in adult; Supervision of high risk pregnancy, antepartum; Pre-existing hypertension affecting pregnancy; GDM (gestational diabetes mellitus); AMA (advanced maternal age) multigravida 35+; Obesity affecting pregnancy, antepartum; Alpha thalassemia silent carrier; Increased Carrier risk for spinal muscular atrophy; Late prenatal care affecting pregnancy in second trimester; BMI 36.0-36.9,adult; and Anemia of pregnancy on their problem list.  Patient reports no complaints.  Contractions: Irritability. Vag. Bleeding: None.  Movement: Present. Denies leaking of fluid.   The following portions of the patient's history were reviewed and updated as appropriate: allergies, current medications, past family history, past medical history, past social history, past surgical history and problem list.   Objective:   Vitals:   01/15/24 1327  BP: 107/72  Pulse: 98  Weight: 217 lb 3.2 oz (98.5 kg)    Fetal Status:  Fetal Heart Rate (bpm): 152   Movement: Present    General: Alert, oriented and cooperative. Patient is in no acute distress.  Skin: Skin is warm and dry. No rash noted.   Cardiovascular: Normal heart rate noted  Respiratory: Normal respiratory effort, no problems with respiration noted  Abdomen: Soft, gravid, appropriate for gestational age.  Pain/Pressure: Absent     Pelvic: Cervical exam deferred        Extremities: Normal range of motion.  Edema: None  Mental Status: Normal mood and affect. Normal behavior. Normal judgment and thought content.      09/06/2023   10:29 AM 05/22/2023   10:20 AM 01/19/2023    9:50 AM   Depression screen PHQ 2/9  Decreased Interest 0 0 0  Down, Depressed, Hopeless 0 0 0  PHQ - 2 Score 0 0 0  Altered sleeping 0 0 0  Tired, decreased energy 1 0 0  Change in appetite 0 0 0  Feeling bad or failure about yourself  0 0 0  Trouble concentrating 0 0 0  Moving slowly or fidgety/restless 0 0 0  Suicidal thoughts 0 0 0  PHQ-9 Score 1  0  0   Difficult doing work/chores  Not difficult at all Not difficult at all     Data saved with a previous flowsheet row definition        09/06/2023   10:29 AM 05/22/2023   10:21 AM 01/19/2023    9:50 AM 07/06/2022    2:00 PM  GAD 7 : Generalized Anxiety Score  Nervous, Anxious, on Edge 0 0 0 0  Control/stop worrying 0 0 0 0  Worry too much - different things 0 0 0 1  Trouble relaxing 0 0 0 0  Restless 0 0 0 0  Easily annoyed or irritable 0 0 0 0  Afraid - awful might happen 0 0 0 0  Total GAD 7 Score 0 0 0 1  Anxiety Difficulty  Not difficult at all Not difficult at all     Assessment and Plan:  Pregnancy: G7P6006 at [redacted]w[redacted]d  1. Pre-existing hypertension affecting pregnancy in third trimester (Primary) On labetalol  100 mg BID Cont baby aspirin  Has BPP scheduled for tomorrow  2. [redacted] weeks gestation of pregnancy  3. Diet controlled gestational diabetes mellitus (GDM) in third trimester Forgot log today  Pt reports sugars have been good, fasting have been 90 something, pp mostly within range, highest was 129, occasionally 122, 124  4. Class 2 severe obesity with serious comorbidity and body mass index (BMI) of 35.0 to 35.9 in adult, unspecified obesity type  5. Multigravida of advanced maternal age in third trimester Will scheduled IOL for 38 weeks, orders placed today  6. Obesity affecting pregnancy, antepartum, unspecified obesity type  7. Supervision of high risk pregnancy, antepartum  8. Alpha thalassemia silent carrier  9. BMI 36.0-36.9,adult  10. Anemia during pregnancy in third trimester  11.  Sarcoidosis   Preterm labor symptoms and general obstetric precautions including but not limited to vaginal bleeding, contractions, leaking of fluid and fetal movement were reviewed in detail with the patient. Please refer to After Visit Summary for other counseling recommendations.   Return in about 1 week (around 01/22/2024) for high OB.  Future Appointments  Date Time Provider Department Center  01/16/2024  3:15 PM [facility] PROVIDER 1 [facility]  01/16/2024  3:30 PM [facility] US4 [facility]  01/22/2024  1:15 PM Zina Jerilynn LABOR, MD [facility]  01/27/2024  6:30 AM [facility] SCHED ROOM [facility]INDC None  01/29/2024  1:15 PM Jomarie Charlie LABOR, MD [facility]  02/05/2024  1:15 PM Eveline Lynwood MATSU, MD [facility]  02/12/2024  1:15 PM Nicholaus Burnard HERO, MD [facility]    Burnard HERO Nicholaus, MD  "

## 2024-01-16 ENCOUNTER — Ambulatory Visit (HOSPITAL_BASED_OUTPATIENT_CLINIC_OR_DEPARTMENT_OTHER): Admitting: Obstetrics and Gynecology

## 2024-01-16 ENCOUNTER — Ambulatory Visit: Attending: Maternal & Fetal Medicine

## 2024-01-16 VITALS — BP 138/73 | HR 93

## 2024-01-16 DIAGNOSIS — O0933 Supervision of pregnancy with insufficient antenatal care, third trimester: Secondary | ICD-10-CM | POA: Diagnosis present

## 2024-01-16 DIAGNOSIS — D563 Thalassemia minor: Secondary | ICD-10-CM | POA: Diagnosis present

## 2024-01-16 DIAGNOSIS — O09523 Supervision of elderly multigravida, third trimester: Secondary | ICD-10-CM | POA: Diagnosis present

## 2024-01-16 DIAGNOSIS — Z148 Genetic carrier of other disease: Secondary | ICD-10-CM | POA: Diagnosis present

## 2024-01-16 DIAGNOSIS — O358XX0 Maternal care for other (suspected) fetal abnormality and damage, not applicable or unspecified: Secondary | ICD-10-CM | POA: Insufficient documentation

## 2024-01-16 DIAGNOSIS — Z3A36 36 weeks gestation of pregnancy: Secondary | ICD-10-CM

## 2024-01-16 DIAGNOSIS — O10913 Unspecified pre-existing hypertension complicating pregnancy, third trimester: Secondary | ICD-10-CM | POA: Diagnosis present

## 2024-01-16 DIAGNOSIS — O09513 Supervision of elderly primigravida, third trimester: Secondary | ICD-10-CM

## 2024-01-16 DIAGNOSIS — O99213 Obesity complicating pregnancy, third trimester: Secondary | ICD-10-CM

## 2024-01-16 DIAGNOSIS — O99013 Anemia complicating pregnancy, third trimester: Secondary | ICD-10-CM

## 2024-01-16 DIAGNOSIS — O24410 Gestational diabetes mellitus in pregnancy, diet controlled: Secondary | ICD-10-CM | POA: Diagnosis present

## 2024-01-16 DIAGNOSIS — E669 Obesity, unspecified: Secondary | ICD-10-CM

## 2024-01-16 DIAGNOSIS — O0990 Supervision of high risk pregnancy, unspecified, unspecified trimester: Secondary | ICD-10-CM | POA: Insufficient documentation

## 2024-01-16 DIAGNOSIS — O10013 Pre-existing essential hypertension complicating pregnancy, third trimester: Secondary | ICD-10-CM

## 2024-01-16 LAB — GC/CHLAMYDIA PROBE AMP (~~LOC~~) NOT AT ARMC
Chlamydia: NEGATIVE
Comment: NEGATIVE
Comment: NORMAL
Neisseria Gonorrhea: NEGATIVE

## 2024-01-16 NOTE — Progress Notes (Signed)
 Patient reports that she checks blood glucose at home and readings are as follows: Fasting levels range from 92 to 99, and two hour post-meal reading ranges from 120.to 128.

## 2024-01-16 NOTE — Progress Notes (Signed)
 Maternal-Fetal Medicine Consultation  Name: Sarah Arnold  MRN: [id number]  GA: [id number] [redacted]w[redacted]d   Chronic hypertension.  Patient takes labetalol  and her blood pressures are well-controlled.  Blood pressure today at our office is 138/73 mmHg.  Gestational diabetes.  Reportedly well-controlled on diet.  Advanced maternal age (44 years at delivery).  Ultrasound Fetal growth is appropriate for gestational age.  The estimated fetal weight is at the 11th percentile.  Normal amniotic fluid.  Cephalic presentation.  Antenatal testing is reassuring.  BPP 8/8.    I reassured the patient of the findings.  I discussed timing of delivery and recommended delivery at [redacted] weeks gestation because of multiple high risk factors including chronic hypertension, gestational diabetes and advanced maternal age.  Patient agrees to be delivered at [redacted] weeks gestation.  She reports that her induction of labor is tentatively scheduled for 01/27/2024.  Recommendations -NST at her prenatal visit on 01/22/2024. - Delivery at [redacted] weeks gestation.     Consultation including face-to-face (more than 50%) counseling 10 minutes.

## 2024-01-18 LAB — CULTURE, BETA STREP (GROUP B ONLY): Strep Gp B Culture: POSITIVE — AB

## 2024-01-19 ENCOUNTER — Telehealth (HOSPITAL_COMMUNITY): Payer: Self-pay | Admitting: *Deleted

## 2024-01-19 NOTE — Telephone Encounter (Signed)
 Preadmission screen

## 2024-01-22 ENCOUNTER — Other Ambulatory Visit: Payer: Self-pay

## 2024-01-22 ENCOUNTER — Ambulatory Visit: Payer: Self-pay | Admitting: Obstetrics and Gynecology

## 2024-01-22 ENCOUNTER — Ambulatory Visit: Admitting: Obstetrics and Gynecology

## 2024-01-22 VITALS — BP 140/84 | HR 88 | Wt 217.4 lb

## 2024-01-22 DIAGNOSIS — D563 Thalassemia minor: Secondary | ICD-10-CM

## 2024-01-22 DIAGNOSIS — O0993 Supervision of high risk pregnancy, unspecified, third trimester: Secondary | ICD-10-CM | POA: Diagnosis not present

## 2024-01-22 DIAGNOSIS — O09523 Supervision of elderly multigravida, third trimester: Secondary | ICD-10-CM | POA: Diagnosis not present

## 2024-01-22 DIAGNOSIS — O0990 Supervision of high risk pregnancy, unspecified, unspecified trimester: Secondary | ICD-10-CM

## 2024-01-22 DIAGNOSIS — O10913 Unspecified pre-existing hypertension complicating pregnancy, third trimester: Secondary | ICD-10-CM

## 2024-01-22 DIAGNOSIS — O99210 Obesity complicating pregnancy, unspecified trimester: Secondary | ICD-10-CM

## 2024-01-22 DIAGNOSIS — Z3A37 37 weeks gestation of pregnancy: Secondary | ICD-10-CM

## 2024-01-22 DIAGNOSIS — O24410 Gestational diabetes mellitus in pregnancy, diet controlled: Secondary | ICD-10-CM

## 2024-01-22 DIAGNOSIS — O99213 Obesity complicating pregnancy, third trimester: Secondary | ICD-10-CM | POA: Diagnosis not present

## 2024-01-22 NOTE — Progress Notes (Signed)
" ° °  PRENATAL VISIT NOTE  Subjective:  Sarah Arnold is a 43 y.o. G7P6006 at [redacted]w[redacted]d being seen today for ongoing prenatal care.  She is currently monitored for the following issues for this high-risk pregnancy and has MAIC (mycobacterium avium-intracellulare complex) (HCC); Sarcoidosis; Class 2 severe obesity with serious comorbidity and body mass index (BMI) of 35.0 to 35.9 in adult; Supervision of high risk pregnancy, antepartum; Pre-existing hypertension affecting pregnancy; GDM (gestational diabetes mellitus); AMA (advanced maternal age) multigravida 35+; Obesity affecting pregnancy, antepartum; Alpha thalassemia silent carrier; Increased Carrier risk for spinal muscular atrophy; Late prenatal care affecting pregnancy in second trimester; BMI 36.0-36.9,adult; and Anemia of pregnancy on their problem list.  Patient doing well with no acute concerns today. She reports fatigue.  Contractions: Regular. Vag. Bleeding: Small.  Movement: Present. Denies leaking of fluid.   The following portions of the patient's history were reviewed and updated as appropriate: allergies, current medications, past family history, past medical history, past social history, past surgical history and problem list. Problem list updated.  Objective:   Vitals:   01/22/24 1329  BP: (!) 141/84  Pulse: 88  Weight: 217 lb 6.4 oz (98.6 kg)    Fetal Status: Fetal Heart Rate (bpm): 152 Fundal Height: 38 cm Movement: Present     General:  Alert, oriented and cooperative. Patient is in no acute distress.  Skin: Skin is warm and dry. No rash noted.   Cardiovascular: Normal heart rate noted  Respiratory: Normal respiratory effort, no problems with respiration noted  Abdomen: Soft, gravid, appropriate for gestational age.  Pain/Pressure: Absent     Pelvic: Cervical exam deferred        Extremities: Normal range of motion.  Edema: None  Mental Status:  Normal mood and affect. Normal behavior. Normal judgment and thought  content.   Assessment and Plan:  Pregnancy: G7P6006 at [redacted]w[redacted]d  1. [redacted] weeks gestation of pregnancy (Primary)   2. Pre-existing hypertension affecting pregnancy in third trimester BP mildly elevated, otherwise asymptomatic Pt still needs BPP/NST this week, will recheck BP at that visit Preeclampsia precautions given  3. Diet controlled gestational diabetes mellitus (GDM) in third trimester FBS: 91-98 PPBS: 121-128  4. Supervision of high risk pregnancy, antepartum Continue routine prenatal care IOL 01/27/24  5. Obesity affecting pregnancy, antepartum, unspecified obesity type   6. Multigravida of advanced maternal age in third trimester   7. Alpha thalassemia silent carrier   Term labor symptoms and general obstetric precautions including but not limited to vaginal bleeding, contractions, leaking of fluid and fetal movement were reviewed in detail with the patient.  Please refer to After Visit Summary for other counseling recommendations.   No follow-ups on file. IOL on 01/27/24  Jerilynn Buddle, MD Faculty Attending Center for [facility]   "

## 2024-01-23 ENCOUNTER — Telehealth (HOSPITAL_COMMUNITY): Payer: Self-pay | Admitting: *Deleted

## 2024-01-23 ENCOUNTER — Encounter (HOSPITAL_COMMUNITY): Payer: Self-pay | Admitting: *Deleted

## 2024-01-23 NOTE — Telephone Encounter (Signed)
 Preadmission screen

## 2024-01-24 ENCOUNTER — Inpatient Hospital Stay (HOSPITAL_COMMUNITY)
Admission: AD | Admit: 2024-01-24 | Discharge: 2024-01-26 | DRG: 806 | Disposition: A | Discharge status: Home / Self Care | Attending: Obstetrics and Gynecology | Admitting: Obstetrics and Gynecology

## 2024-01-24 ENCOUNTER — Encounter (HOSPITAL_COMMUNITY): Payer: Self-pay | Admitting: Obstetrics and Gynecology

## 2024-01-24 ENCOUNTER — Inpatient Hospital Stay (HOSPITAL_COMMUNITY)

## 2024-01-24 ENCOUNTER — Ambulatory Visit: Payer: Self-pay | Admitting: *Deleted

## 2024-01-24 ENCOUNTER — Other Ambulatory Visit: Payer: Self-pay

## 2024-01-24 VITALS — BP 130/73 | HR 85

## 2024-01-24 DIAGNOSIS — E669 Obesity, unspecified: Secondary | ICD-10-CM | POA: Diagnosis not present

## 2024-01-24 DIAGNOSIS — O99214 Obesity complicating childbirth: Secondary | ICD-10-CM | POA: Diagnosis present

## 2024-01-24 DIAGNOSIS — O24410 Gestational diabetes mellitus in pregnancy, diet controlled: Secondary | ICD-10-CM

## 2024-01-24 DIAGNOSIS — O10913 Unspecified pre-existing hypertension complicating pregnancy, third trimester: Secondary | ICD-10-CM

## 2024-01-24 DIAGNOSIS — O0990 Supervision of high risk pregnancy, unspecified, unspecified trimester: Principal | ICD-10-CM

## 2024-01-24 DIAGNOSIS — Z148 Genetic carrier of other disease: Secondary | ICD-10-CM | POA: Diagnosis not present

## 2024-01-24 DIAGNOSIS — O4100X0 Oligohydramnios, unspecified trimester, not applicable or unspecified: Secondary | ICD-10-CM | POA: Diagnosis present

## 2024-01-24 DIAGNOSIS — O99824 Streptococcus B carrier state complicating childbirth: Secondary | ICD-10-CM | POA: Diagnosis present

## 2024-01-24 DIAGNOSIS — O1410 Severe pre-eclampsia, unspecified trimester: Secondary | ICD-10-CM | POA: Insufficient documentation

## 2024-01-24 DIAGNOSIS — O99820 Streptococcus B carrier state complicating pregnancy: Secondary | ICD-10-CM | POA: Diagnosis not present

## 2024-01-24 DIAGNOSIS — Z560 Unemployment, unspecified: Secondary | ICD-10-CM | POA: Diagnosis not present

## 2024-01-24 DIAGNOSIS — O1414 Severe pre-eclampsia complicating childbirth: Secondary | ICD-10-CM | POA: Diagnosis not present

## 2024-01-24 DIAGNOSIS — O09523 Supervision of elderly multigravida, third trimester: Secondary | ICD-10-CM

## 2024-01-24 DIAGNOSIS — O4103X0 Oligohydramnios, third trimester, not applicable or unspecified: Secondary | ICD-10-CM

## 2024-01-24 DIAGNOSIS — O1092 Unspecified pre-existing hypertension complicating childbirth: Secondary | ICD-10-CM | POA: Diagnosis present

## 2024-01-24 DIAGNOSIS — O99213 Obesity complicating pregnancy, third trimester: Secondary | ICD-10-CM

## 2024-01-24 DIAGNOSIS — D563 Thalassemia minor: Secondary | ICD-10-CM

## 2024-01-24 DIAGNOSIS — Z3A37 37 weeks gestation of pregnancy: Secondary | ICD-10-CM

## 2024-01-24 DIAGNOSIS — Z3A38 38 weeks gestation of pregnancy: Secondary | ICD-10-CM | POA: Diagnosis not present

## 2024-01-24 DIAGNOSIS — O9902 Anemia complicating childbirth: Secondary | ICD-10-CM | POA: Diagnosis present

## 2024-01-24 DIAGNOSIS — Z8249 Family history of ischemic heart disease and other diseases of the circulatory system: Secondary | ICD-10-CM

## 2024-01-24 DIAGNOSIS — O0943 Supervision of pregnancy with grand multiparity, third trimester: Secondary | ICD-10-CM | POA: Diagnosis not present

## 2024-01-24 DIAGNOSIS — O368130 Decreased fetal movements, third trimester, not applicable or unspecified: Secondary | ICD-10-CM | POA: Diagnosis not present

## 2024-01-24 DIAGNOSIS — O24420 Gestational diabetes mellitus in childbirth, diet controlled: Secondary | ICD-10-CM | POA: Diagnosis present

## 2024-01-24 DIAGNOSIS — D509 Iron deficiency anemia, unspecified: Secondary | ICD-10-CM | POA: Diagnosis present

## 2024-01-24 DIAGNOSIS — E66812 Obesity, class 2: Secondary | ICD-10-CM | POA: Diagnosis present

## 2024-01-24 DIAGNOSIS — O24419 Gestational diabetes mellitus in pregnancy, unspecified control: Secondary | ICD-10-CM | POA: Diagnosis present

## 2024-01-24 DIAGNOSIS — O114 Pre-existing hypertension with pre-eclampsia, complicating childbirth: Secondary | ICD-10-CM | POA: Diagnosis present

## 2024-01-24 DIAGNOSIS — Z6835 Body mass index (BMI) 35.0-35.9, adult: Secondary | ICD-10-CM

## 2024-01-24 DIAGNOSIS — O09529 Supervision of elderly multigravida, unspecified trimester: Secondary | ICD-10-CM

## 2024-01-24 DIAGNOSIS — O10013 Pre-existing essential hypertension complicating pregnancy, third trimester: Secondary | ICD-10-CM | POA: Diagnosis not present

## 2024-01-24 DIAGNOSIS — I10 Essential (primary) hypertension: Secondary | ICD-10-CM | POA: Diagnosis present

## 2024-01-24 LAB — COMPREHENSIVE METABOLIC PANEL WITH GFR
ALT: 19 U/L (ref 0–44)
AST: 20 U/L (ref 15–41)
Albumin: 3.8 g/dL (ref 3.5–5.0)
Alkaline Phosphatase: 183 U/L — ABNORMAL HIGH (ref 38–126)
Anion gap: 13 (ref 5–15)
BUN: 11 mg/dL (ref 6–20)
CO2: 19 mmol/L — ABNORMAL LOW (ref 22–32)
Calcium: 9.7 mg/dL (ref 8.9–10.3)
Chloride: 103 mmol/L (ref 98–111)
Creatinine, Ser: 0.8 mg/dL (ref 0.44–1.00)
GFR, Estimated: >60 mL/min
Glucose, Bld: 184 mg/dL — ABNORMAL HIGH (ref 70–99)
Potassium: 3.9 mmol/L (ref 3.5–5.1)
Sodium: 135 mmol/L (ref 135–145)
Total Bilirubin: <0.2 mg/dL (ref 0.0–1.2)
Total Protein: 7.5 g/dL (ref 6.5–8.1)

## 2024-01-24 LAB — TYPE AND SCREEN
ABO/RH(D): O POS
Antibody Screen: NEGATIVE

## 2024-01-24 LAB — CBC
HCT: 35.8 % — ABNORMAL LOW (ref 36.0–46.0)
Hemoglobin: 11.7 g/dL — ABNORMAL LOW (ref 12.0–15.0)
MCH: 25.6 pg — ABNORMAL LOW (ref 26.0–34.0)
MCHC: 32.7 g/dL (ref 30.0–36.0)
MCV: 78.3 fL — ABNORMAL LOW (ref 80.0–100.0)
Platelets: 400 K/uL (ref 150–400)
RBC: 4.57 MIL/uL (ref 3.87–5.11)
RDW: 15.9 % — ABNORMAL HIGH (ref 11.5–15.5)
WBC: 10.3 K/uL (ref 4.0–10.5)
nRBC: 0 % (ref 0.0–0.2)

## 2024-01-24 LAB — GLUCOSE, CAPILLARY
Glucose-Capillary: 190 mg/dL — ABNORMAL HIGH (ref 70–99)
Glucose-Capillary: 114 mg/dL — ABNORMAL HIGH (ref 70–99)

## 2024-01-24 MED ORDER — OXYTOCIN BOLUS FROM INFUSION
333.0000 mL | Freq: Once | INTRAVENOUS | Status: AC
  Administered 2024-01-25: 333 mL via INTRAVENOUS

## 2024-01-24 MED ORDER — SOD CITRATE-CITRIC ACID 500-334 MG/5ML PO SOLN: 30.0000 mL | ORAL | Status: DC | PRN

## 2024-01-24 MED ORDER — SODIUM CHLORIDE 0.9 % IV SOLN
5.0000 10*6.[IU] | Freq: Once | INTRAVENOUS | Status: AC
  Administered 2024-01-24: 5 10*6.[IU] via INTRAVENOUS
  Filled 2024-01-24: qty 5

## 2024-01-24 MED ORDER — OXYTOCIN-SODIUM CHLORIDE 30-0.9 UT/500ML-% IV SOLN: 1.0000 m[IU]/min | INTRAVENOUS | Status: DC

## 2024-01-24 MED ORDER — LIDOCAINE HCL (PF) 1 % IJ SOLN
30.0000 mL | INTRAMUSCULAR | Status: AC | PRN
  Administered 2024-01-25: 30 mL via SUBCUTANEOUS
  Filled 2024-01-24: qty 30

## 2024-01-24 MED ORDER — ONDANSETRON HCL 4 MG/2ML IJ SOLN: 4.0000 mg | Freq: Four times a day (QID) | INTRAMUSCULAR | Status: DC | PRN

## 2024-01-24 MED ORDER — ACETAMINOPHEN 325 MG PO TABS: 650.0000 mg | ORAL_TABLET | ORAL | Status: DC | PRN

## 2024-01-24 MED ORDER — OXYTOCIN-SODIUM CHLORIDE 30-0.9 UT/500ML-% IV SOLN
1.0000 m[IU]/min | INTRAVENOUS | Status: DC
  Administered 2024-01-24: 2 m[IU]/min via INTRAVENOUS
  Filled 2024-01-24: qty 500

## 2024-01-24 MED ORDER — LABETALOL HCL 200 MG PO TABS
200.0000 mg | ORAL_TABLET | Freq: Two times a day (BID) | ORAL | Status: DC
  Administered 2024-01-24 – 2024-01-26 (×4): 200 mg via ORAL
  Filled 2024-01-24 (×4): qty 1

## 2024-01-24 MED ORDER — INSULIN ASPART 100 UNIT/ML IJ SOLN
0.0000 [IU] | INTRAMUSCULAR | Status: DC
  Administered 2024-01-24 – 2024-01-25 (×2): 1 [IU] via SUBCUTANEOUS
  Filled 2024-01-24 (×2): qty 1

## 2024-01-24 MED ORDER — LACTATED RINGERS IV SOLN: INTRAVENOUS | Status: AC

## 2024-01-24 MED ORDER — TERBUTALINE SULFATE 1 MG/ML IJ SOLN: 0.2500 mg | Freq: Once | INTRAMUSCULAR | Status: DC | PRN

## 2024-01-24 MED ORDER — OXYTOCIN-SODIUM CHLORIDE 30-0.9 UT/500ML-% IV SOLN: 2.5000 [IU]/h | INTRAVENOUS | Status: DC

## 2024-01-24 MED ORDER — LACTATED RINGERS IV SOLN: 500.0000 mL | INTRAVENOUS | Status: AC | PRN

## 2024-01-24 MED ORDER — PENICILLIN G POT IN DEXTROSE 60000 UNIT/ML IV SOLN
3.0000 10*6.[IU] | INTRAVENOUS | Status: DC
  Administered 2024-01-25 (×2): 3 10*6.[IU] via INTRAVENOUS
  Filled 2024-01-24 (×3): qty 50

## 2024-01-24 NOTE — Consult Note (Signed)
 MFM Consult Note  Sarah Arnold is a 44 year old G7, P6 currently at 37 weeks and 6 days.  She was sent to the MAU due to decreased fetal movements and a nonreactive NST in the office.  Her pregnancy has been complicated by advanced maternal age, grand multiparity, maternal obesity, gestational diabetes, and chronic hypertension.  She is scheduled for induction on January 27, 2024.  Sonographic findings Single intrauterine pregnancy at 37w 6d. Fetal cardiac activity: Observed. Presentation: Cephalic. Interval fetal anatomy appears normal. Amniotic fluid: Oligohydramnios. AFI: 5.3 cm Placenta: Posterior. BPP: 8/8.   Due to her underlying medical conditions and oligohydramnios noted on today's ultrasound exam, along with her complaints of decreased fetal movements and nonreactive NST, delivery is recommended today.  The patient will be admitted for induction this evening.

## 2024-01-24 NOTE — Progress Notes (Signed)
 NST non-reactive. Dr. Abigail notified. Pt sent to MAU for BPP and further evaluation

## 2024-01-24 NOTE — MAU Note (Signed)
 Sarah Arnold is a 43 y.o. at [redacted]w[redacted]d here in MAU reporting: sent from [facility] office, states the baby isn't moving like they want it to be.  Denies VB and LOF.  Reports +FM today.  LMP: 03/27/2023 Onset of complaint: today Pain score: 0 Vitals:   01/24/24 1323  BP: (!) 143/88  Pulse: 88  Resp: 18  Temp: 98.2 F (36.8 C)  SpO2: 100%     FHT: 148 bpm  Lab orders placed from triage: None

## 2024-01-24 NOTE — MAU Provider Note (Signed)
 " History     [id number]  Arrival date and time: 01/24/24 1307    Chief Complaint  Patient presents with   Fetal Monitoring     HPI Sarah Arnold is a 43 y.o. at [redacted]w[redacted]d who presents for non-reactive NST at the office. She has no complaints.     O/Positive/-- (07/17 0000)  Past Medical History:  Diagnosis Date   Abnormal Pap smear    Allergic rhinitis 04/10/2013   Anemia    Cough 02/11/2013   Gastritis 05/14/2013   Gestational diabetes    Healthcare maintenance 02/12/2015   Hypertension    Late prenatal care    Multiple skin nodules 05/15/2014   Pneumococcal pneumonia    Shortness of breath 02/11/2013    Past Surgical History:  Procedure Laterality Date   NO PAST SURGERIES     VIDEO BRONCHOSCOPY Bilateral 04/17/2013   Procedure: VIDEO BRONCHOSCOPY WITHOUT FLUORO;  Surgeon: Lamar GORMAN Chris, MD;  Location: [facility];  Service: Cardiopulmonary;  Laterality: Bilateral;    Family History  Problem Relation Age of Onset   Hypertension Father    Asthma Son    Arthritis Neg Hx    Alcohol abuse Neg Hx    Birth defects Neg Hx    Cancer Neg Hx    COPD Neg Hx    Depression Neg Hx    Diabetes Neg Hx    Drug abuse Neg Hx    Early death Neg Hx    Hearing loss Neg Hx    Heart disease Neg Hx    Hyperlipidemia Neg Hx    Kidney disease Neg Hx    Learning disabilities Neg Hx    Mental illness Neg Hx    Mental retardation Neg Hx    Miscarriages / Stillbirths Neg Hx    Stroke Neg Hx    Vision loss Neg Hx     Social History   Socioeconomic History   Marital status: Married    Spouse name: Not on file   Number of children: 6   Years of education: Not on file   Highest education level: Not on file  Occupational History   Occupation: unemployed  Tobacco Use   Smoking status: Never   Smokeless tobacco: Never  Vaping Use   Vaping status: Never Used  Substance and Sexual Activity   Alcohol use: No   Drug use: No   Sexual activity: Not Currently  Other  Topics Concern   Not on file  Social History Narrative   Immigrated from [place] in 2001   No known TB exposures   Lives now in [place] in [place]   Social Drivers of Health   Tobacco Use: Low Risk (01/24/2024)   Patient History    Smoking Tobacco Use: Never    Smokeless Tobacco Use: Never    Passive Exposure: Not on file  Financial Resource Strain: Low Risk (01/19/2023)   Overall Financial Resource Strain (CARDIA)    Difficulty of Paying Living Expenses: Not hard at all  Food Insecurity: No Food Insecurity (01/24/2024)   Epic    Worried About Radiation Protection Practitioner of Food in the Last Year: Never true    Ran Out of Food in the Last Year: Never true  Transportation Needs: No Transportation Needs (01/24/2024)   Epic    Lack of Transportation (Medical): No    Lack of Transportation (Non-Medical): No  Physical Activity: Insufficiently Active (01/19/2023)   Exercise Vital Sign    Days of Exercise per Week: 2 days  Minutes of Exercise per Session: 20 min  Stress: No Stress Concern Present (01/19/2023)   [employer] of Occupational Health - Occupational Stress Questionnaire    Feeling of Stress : Not at all  Social Connections: Moderately Integrated (01/19/2023)   Social Connection and Isolation Panel    Frequency of Communication with Friends and Family: More than three times a week    Frequency of Social Gatherings with Friends and Family: More than three times a week    Attends Religious Services: Never    Database Administrator or Organizations: Yes    Attends Engineer, Structural: More than 4 times per year    Marital Status: Married  Catering Manager Violence: Not At Risk (01/24/2024)   Epic    Fear of Current or Ex-Partner: No    Emotionally Abused: No    Physically Abused: No    Sexually Abused: No  Depression (PHQ2-9): Low Risk (09/06/2023)   Depression (PHQ2-9)    PHQ-2 Score: 1  Alcohol Screen: Low Risk (01/19/2023)   Alcohol Screen    Last Alcohol Screening Score  (AUDIT): 0  Housing: Low Risk (01/24/2024)   Epic    Unable to Pay for Housing in the Last Year: No    Number of Times Moved in the Last Year: 0    Homeless in the Last Year: No  Utilities: Not At Risk (01/24/2024)   Epic    Threatened with loss of utilities: No  Health Literacy: Inadequate Health Literacy (01/19/2023)   B1300 Health Literacy    Frequency of need for help with medical instructions: Sometimes    Allergies[1]  Medications Ordered Prior to Encounter[2]  Pertinent positives and negative per HPI, all others reviewed and negative  Physical Exam   BP (!) 143/88 (BP Location: Right Arm)   Pulse 88   Temp 98.2 F (36.8 C) (Oral)   Resp 18   Ht 5' 4 (1.626 m)   Wt 99.1 kg   LMP 03/27/2023 (Approximate)   SpO2 100%   BMI 37.49 kg/m   Patient Vitals for the past 24 hrs:  BP Temp Temp src Pulse Resp SpO2 Height Weight  01/24/24 1323 (!) 143/88 98.2 F (36.8 C) Oral 88 18 100 % -- --  01/24/24 1319 -- -- -- -- -- -- 5' 4 (1.626 m) 99.1 kg    Physical Exam Vitals and nursing note reviewed.  Constitutional:      Appearance: She is well-developed.  HENT:     Head: Normocephalic and atraumatic.     Mouth/Throat:     Mouth: Mucous membranes are moist.  Eyes:     Extraocular Movements: Extraocular movements intact.  Cardiovascular:     Rate and Rhythm: Normal rate and regular rhythm.  Pulmonary:     Effort: Pulmonary effort is normal.  Abdominal:     Palpations: Abdomen is soft.     Tenderness: There is no abdominal tenderness.  Skin:    Capillary Refill: Capillary refill takes less than 2 seconds.  Neurological:     General: No focal deficit present.     Mental Status: She is alert.     FHT Baseline: 135 bpm Variability: Good {> 6 bpm) Accelerations: Reactive Decelerations: Absent Uterine activity: None  Labs No results found for this or any previous visit (from the past 24 hours).  Imaging No results found.  MAU Course  Procedures Lab  Orders  No laboratory test(s) ordered today   No orders of the defined types  were placed in this encounter.   Imaging Orders         US  MFM FETAL BPP WO NON STRESS     MDM Moderate (Level 3-4)  Assessment and Plan  Supervision of high risk pregnancy, antepartum  Pre-existing hypertension affecting pregnancy in third trimester - Plan: CBC, Type and screen, RPR, Comprehensive metabolic panel, CBC, Type and screen, RPR, Comprehensive metabolic panel  Diet controlled gestational diabetes mellitus (GDM) in third trimester  Alpha thalassemia silent carrier  Increased Carrier risk for spinal muscular atrophy  Decreased fetal movements in third trimester, single or unspecified fetus  [redacted] weeks gestation of pregnancy   #FWB: NST: Reactive  Jaquilla A Fidalgo is a 43 y.o. at [redacted]w[redacted]d who presents for non-reactive NST at the office.  -MFM recommended induction of labor today due to oligohydramnios and underlying medical conditions.  -Patient to be admitted to L&D. -See Dr. Jerlene MFM note and H&P by Dr. Danny for further information.    Dietrick Barris L Josaphine Shimamoto, MD/MHA 01/24/2024 5:02 PM  Allergies as of 01/24/2024       Reactions   Norvasc  [amlodipine ]    Caused constipation        Medication List     TAKE these medications    Accu-Chek Guide w/Device Kit 1 kit by Does not apply route in the morning, at noon, in the evening, and at bedtime.   Accu-Chek Softclix Lancets lancets Dx code 024.419 check glucose four times a day: Fasting, 2 hours after breakfast, 2 hours after lunch and 2 hours after dinner   aspirin  EC 81 MG tablet Take 1 tablet (81 mg total) by mouth daily. Take after 12 weeks for prevention of preeclampsia later in pregnancy   ferrous sulfate  325 (65 FE) MG tablet Take 1 tablet (325 mg total) by mouth every other day.   FT ClearLax 17 GM/SCOOP powder Generic drug: polyethylene glycol powder Take 17 g by mouth daily as needed for mild constipation.   glucose  blood test strip Dx code 024.419 check glucose four times a day: Fasting, 2 hours after breakfast, 2 hours after lunch and 2 hours after dinner   labetalol  100 MG tablet Commonly known as: NORMODYNE  Take 1 tablet (100 mg total) by mouth 2 (two) times daily.   multivitamin-prenatal 27-0.8 MG Tabs tablet Take 1 tablet by mouth daily at 12 noon.           [1]  Allergies Allergen Reactions   Norvasc  [Amlodipine ]     Caused constipation  [2]  No current facility-administered medications on file prior to encounter.   Current Outpatient Medications on File Prior to Encounter  Medication Sig Dispense Refill   aspirin  EC 81 MG tablet Take 1 tablet (81 mg total) by mouth daily. Take after 12 weeks for prevention of preeclampsia later in pregnancy 300 tablet 2   ferrous sulfate  325 (65 FE) MG tablet Take 1 tablet (325 mg total) by mouth every other day. 30 tablet 11   labetalol  (NORMODYNE ) 100 MG tablet Take 1 tablet (100 mg total) by mouth 2 (two) times daily. 60 tablet 1   polyethylene glycol powder (GLYCOLAX /MIRALAX ) 17 GM/SCOOP powder Take 17 g by mouth daily as needed for mild constipation. 3350 g 1   Prenatal Vit-Fe Fumarate-FA (MULTIVITAMIN-PRENATAL) 27-0.8 MG TABS tablet Take 1 tablet by mouth daily at 12 noon.     Accu-Chek Softclix Lancets lancets Dx code 024.419 check glucose four times a day: Fasting, 2 hours after breakfast, 2 hours after lunch  and 2 hours after dinner 100 each 12   Blood Glucose Monitoring Suppl (ACCU-CHEK GUIDE) w/Device KIT 1 kit by Does not apply route in the morning, at noon, in the evening, and at bedtime. 1 kit 0   glucose blood test strip Dx code 024.419 check glucose four times a day: Fasting, 2 hours after breakfast, 2 hours after lunch and 2 hours after dinner 100 each 12   "

## 2024-01-24 NOTE — Progress Notes (Signed)
 Labor Progress Note  In to see patient for exam SCE 1/l/high, soft, mid position FHR 140bpm, moderate, 10x10s, no decels Toco flat  Foley balloon placed without issue.  Start pit 2x2  Recheck in 4-6 hours or sooner prn  Kieth Carolin, MD Obstetrician & Gynecologist, [facility] for [employer], [facility]

## 2024-01-24 NOTE — H&P (Addendum)
 OBSTETRIC ADMISSION HISTORY AND PHYSICAL  Sarah Arnold is 43 y.o. H2E3993 with IUP at [redacted]w[redacted]d 02/08/2024, by Ultrasound presenting for IOL for term, decreased fetal movement and cHTN. She received her prenatal care at [facility]   ROS (+) FM (-) ctx, VB, LOF. HA, visual changes, CP, SOB, RUQ pain, peripheral edema.  Sono at 36+5w: normal anatomy, cephalic presentation, posterior placenta, EFW 2502g, (11%)  Prenatal History/Complications NURSING  PROVIDER  Conservator, [employer] Dating by 18 wk US   PNC Model Traditional Anatomy U/S Nml, dating changed  Initiated care at  [facility]  English               LAB RESULTS   Support Person   husband Genetics NIPS: Low risk female AFP: Low risk      NT/IT (FT only)        Carrier Screen Horizon: SCAT, SMA carrier. Declined partner screening.   Rhogam  O/Positive/-- (07/17 0000) A1C/GTT Early HgbA1C: GTT 222. Dx GDM Third trimester 2 hr GTT: NA  Flu Vaccine 11/20/23      TDaP Vaccine  Received 11/07/23 Blood Type O/Positive/-- (07/17 0000)  RSV Vaccine   Antibody Negative (07/17 0000)  COVID Vaccine   Rubella Immune (07/17 0000)  Feeding Plan breast RPR Nonreactive (07/17 0000)  Contraception Undecided  HBsAg Negative (07/17 0000)  Circumcision   HIV Non-reactive (07/17 0000)  Pediatrician  [facility] HCVAb Negative (07/17 0000)  Prenatal Classes        BTL Consent   Pap       Diagnosis  Date Value Ref Range Status  03/03/2022     Final    - Negative for intraepithelial lesion or malignancy (NILM)    BTL Pre-payment   GC/CT Initial:   36wks:    VBAC Consent   GBS   For PCN allergy, check sensitivities   BRx Optimized? [ ]  yes   [ ]  no      DME Rx [ ]  BP cuff [ ]  Weight Scale Waterbirth  [ ]  Class [ ]  Consent [ ]  CNM visit  PHQ9 & GAD7 [  ] new OB [  ] 2 weeks  [  ] 36 weeks Induction  [ ]  Orders Entered [ ] Foley Y/N   OB History  Gravida Para Term Preterm AB Living  7 6 6   6   SAB IAB  Ectopic Multiple Live Births      6    # Outcome Date GA Lbr Len/2nd Weight Sex Type Anes PTL Lv  7 Current           6 Term 10/05/12 [redacted]w[redacted]d 16:17 3572 g M Vag-Spont None  LIV     Birth Comments: within normal limits  5 Term 2012 [redacted]w[redacted]d 04:00 3005 g F Vag-Spont   LIV  4 Term 2009 [redacted]w[redacted]d 06:00 3374 g F Vag-Spont   LIV  3 Term 2006 [redacted]w[redacted]d 03:00 3600 g F Vag-Spont None  LIV  2 Term 2005 [redacted]w[redacted]d 24:00 3005 g M Vag-Spont   LIV  1 Term 2003 [redacted]w[redacted]d 48:00 2807 g F Vag-Spont EPI  LIV   Patient Active Problem List   Diagnosis Date Noted   Anemia of pregnancy 12/02/2023   BMI 36.0-36.9,adult 11/09/2023   Late prenatal care affecting pregnancy in second trimester 09/06/2023   Alpha thalassemia silent carrier 09/04/2023   Increased Carrier risk for spinal muscular  atrophy 09/04/2023   AMA (advanced maternal age) multigravida 35+ 08/30/2023   Obesity affecting pregnancy, antepartum 08/30/2023   Supervision of high risk pregnancy, antepartum 08/28/2023   Pre-existing hypertension affecting pregnancy 08/28/2023   GDM (gestational diabetes mellitus) 08/28/2023   Class 2 severe obesity with serious comorbidity and body mass index (BMI) of 35.0 to 35.9 in adult 01/31/2022   Sarcoidosis 05/15/2014   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    Past Medical History: Past Medical History:  Diagnosis Date   Abnormal Pap smear    Allergic rhinitis 04/10/2013   Anemia    Cough 02/11/2013   Gastritis 05/14/2013   Gestational diabetes    Healthcare maintenance 02/12/2015   Hypertension    Late prenatal care    Multiple skin nodules 05/15/2014   Pneumococcal pneumonia    Shortness of breath 02/11/2013    Past Surgical History: Past Surgical History:  Procedure Laterality Date   NO PAST SURGERIES     VIDEO BRONCHOSCOPY Bilateral 04/17/2013   Procedure: VIDEO BRONCHOSCOPY WITHOUT FLUORO;  Surgeon: Lamar GORMAN Chris, MD;  Location: [facility];  Service: Cardiopulmonary;   Laterality: Bilateral;    Social History Social History   Socioeconomic History   Marital status: Married    Spouse name: Not on file   Number of children: 6   Years of education: Not on file   Highest education level: Not on file  Occupational History   Occupation: unemployed  Tobacco Use   Smoking status: Never   Smokeless tobacco: Never  Vaping Use   Vaping status: Never Used  Substance and Sexual Activity   Alcohol use: No   Drug use: No   Sexual activity: Not Currently  Other Topics Concern   Not on file  Social History Narrative   Immigrated from [place] in 2001   No known TB exposures   Lives now in [place] in [place]   Social Drivers of Health   Tobacco Use: Low Risk (01/24/2024)   Patient History    Smoking Tobacco Use: Never    Smokeless Tobacco Use: Never    Passive Exposure: Not on file  Financial Resource Strain: Low Risk (01/19/2023)   Overall Financial Resource Strain (CARDIA)    Difficulty of Paying Living Expenses: Not hard at all  Food Insecurity: No Food Insecurity (01/24/2024)   Epic    Worried About Radiation Protection Practitioner of Food in the Last Year: Never true    Ran Out of Food in the Last Year: Never true  Transportation Needs: No Transportation Needs (01/24/2024)   Epic    Lack of Transportation (Medical): No    Lack of Transportation (Non-Medical): No  Physical Activity: Insufficiently Active (01/19/2023)   Exercise Vital Sign    Days of Exercise per Week: 2 days    Minutes of Exercise per Session: 20 min  Stress: No Stress Concern Present (01/19/2023)   [employer] of Occupational Health - Occupational Stress Questionnaire    Feeling of Stress : Not at all  Social Connections: Moderately Integrated (01/19/2023)   Social Connection and Isolation Panel    Frequency of Communication with Friends and Family: More than three times a week    Frequency of Social Gatherings with Friends and Family: More than three times a week    Attends Religious Services:  Never    Database Administrator or Organizations: Yes    Attends Engineer, Structural: More than 4 times per year    Marital Status: Married  Depression (  PHQ2-9): Low Risk (09/06/2023)   Depression (PHQ2-9)    PHQ-2 Score: 1  Alcohol Screen: Low Risk (01/19/2023)   Alcohol Screen    Last Alcohol Screening Score (AUDIT): 0  Housing: Low Risk (01/24/2024)   Epic    Unable to Pay for Housing in the Last Year: No    Number of Times Moved in the Last Year: 0    Homeless in the Last Year: No  Utilities: Not At Risk (01/24/2024)   Epic    Threatened with loss of utilities: No  Health Literacy: Inadequate Health Literacy (01/19/2023)   B1300 Health Literacy    Frequency of need for help with medical instructions: Sometimes    Family History: Family History  Problem Relation Age of Onset   Hypertension Father    Asthma Son    Arthritis Neg Hx    Alcohol abuse Neg Hx    Birth defects Neg Hx    Cancer Neg Hx    COPD Neg Hx    Depression Neg Hx    Diabetes Neg Hx    Drug abuse Neg Hx    Early death Neg Hx    Hearing loss Neg Hx    Heart disease Neg Hx    Hyperlipidemia Neg Hx    Kidney disease Neg Hx    Learning disabilities Neg Hx    Mental illness Neg Hx    Mental retardation Neg Hx    Miscarriages / Stillbirths Neg Hx    Stroke Neg Hx    Vision loss Neg Hx     Allergies: Allergies[1]  Medications Prior to Admission  Medication Sig Dispense Refill Last Dose/Taking   aspirin  EC 81 MG tablet Take 1 tablet (81 mg total) by mouth daily. Take after 12 weeks for prevention of preeclampsia later in pregnancy 300 tablet 2 01/24/2024   ferrous sulfate  325 (65 FE) MG tablet Take 1 tablet (325 mg total) by mouth every other day. 30 tablet 11 01/24/2024   labetalol  (NORMODYNE ) 100 MG tablet Take 1 tablet (100 mg total) by mouth 2 (two) times daily. 60 tablet 1 01/24/2024   polyethylene glycol powder (GLYCOLAX /MIRALAX ) 17 GM/SCOOP powder Take 17 g by mouth daily as needed  for mild constipation. 3350 g 1 Past Week   Prenatal Vit-Fe Fumarate-FA (MULTIVITAMIN-PRENATAL) 27-0.8 MG TABS tablet Take 1 tablet by mouth daily at 12 noon.   01/24/2024   Accu-Chek Softclix Lancets lancets Dx code 024.419 check glucose four times a day: Fasting, 2 hours after breakfast, 2 hours after lunch and 2 hours after dinner 100 each 12    Blood Glucose Monitoring Suppl (ACCU-CHEK GUIDE) w/Device KIT 1 kit by Does not apply route in the morning, at noon, in the evening, and at bedtime. 1 kit 0    glucose blood test strip Dx code 024.419 check glucose four times a day: Fasting, 2 hours after breakfast, 2 hours after lunch and 2 hours after dinner 100 each 12      Review of Systems  All systems reviewed and negative except as stated in HPI  PHYSICAL EXAM Blood pressure (!) 143/88, pulse 88, temperature 98.2 F (36.8 C), temperature source Oral, resp. rate 18, height 5' 4 (1.626 m), weight 99.1 kg, last menstrual period 03/27/2023, SpO2 100%. General appearance: alert and cooperative Lungs: respirations nonlabored Heart: regular rate Abdomen: gravid  Fetal monitoringBaseline: 140 bpm, Variability: Good {> 6 bpm), Accelerations: Reactive, and Decelerations: Absent Uterine activity none Dilation: 1 Effacement (%): Thick Station: -3 Exam by::  Dr. Erik  Prenatal labs: ABO, Rh: O/Positive/-- (07/17 0000) Antibody: Negative (07/17 0000) Rubella: Immune (07/17 0000) RPR: Non Reactive (10/14 1645)  HBsAg: Negative (07/17 0000)  HIV: Non Reactive (10/14 1645)   Lab Results  Component Value Date   GBS Positive (A) 01/15/2024    Immunization History  Administered Date(s) Administered   Influenza, Seasonal, Injecte, Preservative Fre 11/20/2023   Influenza,inj,Quad PF,6+ Mos 10/05/2012, 02/12/2015, 03/09/2018, 01/31/2022   PFIZER(Purple Top)SARS-COV-2 Vaccination 05/02/2019   Tdap 05/22/2023, 11/07/2023    Prenatal Transfer Tool  Maternal Diabetes: Yes:  Diabetes Type:   Diet controlled Genetic Screening: Normal Maternal Ultrasounds/Referrals: Normal Fetal Ultrasounds or other Referrals:  None Maternal Substance Abuse:  No Significant Maternal Medications:  None Significant Maternal Lab Results: Group B Strep positive Number of Prenatal Visits:greater than 3 verified prenatal visits Maternal Vaccinations:TDap and Flu Other Comments:  None   No results found for this or any previous visit (from the past 24 hours).  Patient Active Problem List   Diagnosis Date Noted   Anemia of pregnancy 12/02/2023   BMI 36.0-36.9,adult 11/09/2023   Late prenatal care affecting pregnancy in second trimester 09/06/2023   Alpha thalassemia silent carrier 09/04/2023   Increased Carrier risk for spinal muscular atrophy 09/04/2023   AMA (advanced maternal age) multigravida 35+ 08/30/2023   Obesity affecting pregnancy, antepartum 08/30/2023   Supervision of high risk pregnancy, antepartum 08/28/2023   Pre-existing hypertension affecting pregnancy 08/28/2023   GDM (gestational diabetes mellitus) 08/28/2023   Class 2 severe obesity with serious comorbidity and body mass index (BMI) of 35.0 to 35.9 in adult 01/31/2022   Sarcoidosis 05/15/2014   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 H2E3993 with IUP at [redacted]w[redacted]d 02/08/2024, by Ultrasound admitted for IOL for term, decreased fetal movement and HTN. She received her prenatal care at [facility] .  Sono at 39w: normal anatomy, cephalic presentation, posterior placenta, EFW 2502g, (11%)  #Labor: IOL, FB placed, start pit #Pain: Per patient preference, encourage ambulation #FWB: Cat 1  #DFM #oligohydramnios - nonreactive NST today. BPP was 8/8.  - new oligo: AFI 5.3cm, <3rd percentile  #GDMa1 Reports appropriate BG control - CBG q4h during latent phase, q2h active phase - EFW 11% at 36+5  #cHTN - labetalol  200 BID. Rx is 100 BID, however patient has  been taking 200 BID and still having elevated bp, so will continue 200mg  BID. - EFW 11% at 36+5  #AMA - LR Panorama, nl AFP  #abnormal carrier screen - Horizon: alpha thalassemia silent carrier, increased carrier risk SMA - declined FOB carrier screen  #Sarcoidosis #history of MAIC (mycobacterium avium-intracellulare complex) Sees [facility], last visit 08/2023.  - Sarcoid is stable, no meds - MAIC was treated in 2015.   #GBS status:  positive #Feeding: Breastmilk  #Reproductive Life planning: Undecided #Circ:  yes  Conard Me, SNM, RNC-OB Student Nurse Midwife 01/24/2024 5:39 PM   Barabara Maier, DO FM-OB Fellow Center for [facility]      [1]  Allergies Allergen Reactions   Norvasc  [Amlodipine ]     Caused constipation

## 2024-01-25 ENCOUNTER — Encounter (HOSPITAL_COMMUNITY): Payer: Self-pay | Admitting: Obstetrics and Gynecology

## 2024-01-25 DIAGNOSIS — O368130 Decreased fetal movements, third trimester, not applicable or unspecified: Secondary | ICD-10-CM

## 2024-01-25 DIAGNOSIS — O99820 Streptococcus B carrier state complicating pregnancy: Secondary | ICD-10-CM

## 2024-01-25 DIAGNOSIS — O10913 Unspecified pre-existing hypertension complicating pregnancy, third trimester: Secondary | ICD-10-CM | POA: Diagnosis present

## 2024-01-25 DIAGNOSIS — Z3A37 37 weeks gestation of pregnancy: Secondary | ICD-10-CM

## 2024-01-25 DIAGNOSIS — O4103X0 Oligohydramnios, third trimester, not applicable or unspecified: Secondary | ICD-10-CM

## 2024-01-25 DIAGNOSIS — O1414 Severe pre-eclampsia complicating childbirth: Secondary | ICD-10-CM

## 2024-01-25 DIAGNOSIS — O09523 Supervision of elderly multigravida, third trimester: Secondary | ICD-10-CM

## 2024-01-25 DIAGNOSIS — Z3A38 38 weeks gestation of pregnancy: Secondary | ICD-10-CM

## 2024-01-25 DIAGNOSIS — O24420 Gestational diabetes mellitus in childbirth, diet controlled: Secondary | ICD-10-CM

## 2024-01-25 DIAGNOSIS — O99214 Obesity complicating childbirth: Secondary | ICD-10-CM

## 2024-01-25 DIAGNOSIS — O1410 Severe pre-eclampsia, unspecified trimester: Secondary | ICD-10-CM | POA: Insufficient documentation

## 2024-01-25 LAB — CBC WITH DIFFERENTIAL/PLATELET
Abs Immature Granulocytes: 0.07 K/uL (ref 0.00–0.07)
Basophils Absolute: 0.1 K/uL (ref 0.0–0.1)
Basophils Relative: 0 %
Eosinophils Absolute: 0.3 K/uL (ref 0.0–0.5)
Eosinophils Relative: 2 %
HCT: 35.6 % — ABNORMAL LOW (ref 36.0–46.0)
Hemoglobin: 11.6 g/dL — ABNORMAL LOW (ref 12.0–15.0)
Immature Granulocytes: 1 %
Lymphocytes Relative: 19 %
Lymphs Abs: 2.5 K/uL (ref 0.7–4.0)
MCH: 25.6 pg — ABNORMAL LOW (ref 26.0–34.0)
MCHC: 32.6 g/dL (ref 30.0–36.0)
MCV: 78.4 fL — ABNORMAL LOW (ref 80.0–100.0)
Monocytes Absolute: 1.2 K/uL — ABNORMAL HIGH (ref 0.1–1.0)
Monocytes Relative: 9 %
Neutro Abs: 8.8 K/uL — ABNORMAL HIGH (ref 1.7–7.7)
Neutrophils Relative %: 69 %
Platelets: 338 K/uL (ref 150–400)
RBC: 4.54 MIL/uL (ref 3.87–5.11)
RDW: 16 % — ABNORMAL HIGH (ref 11.5–15.5)
WBC: 12.9 K/uL — ABNORMAL HIGH (ref 4.0–10.5)
nRBC: 0 % (ref 0.0–0.2)

## 2024-01-25 LAB — COMPREHENSIVE METABOLIC PANEL WITH GFR
ALT: 17 U/L (ref 0–44)
ALT: 17 U/L (ref 0–44)
AST: 24 U/L (ref 15–41)
AST: 20 U/L (ref 15–41)
Albumin: 3.5 g/dL (ref 3.5–5.0)
Albumin: 3.2 g/dL — ABNORMAL LOW (ref 3.5–5.0)
Alkaline Phosphatase: 148 U/L — ABNORMAL HIGH (ref 38–126)
Alkaline Phosphatase: 131 U/L — ABNORMAL HIGH (ref 38–126)
Anion gap: 12 (ref 5–15)
Anion gap: 10 (ref 5–15)
BUN: 12 mg/dL (ref 6–20)
BUN: 15 mg/dL (ref 6–20)
CO2: 19 mmol/L — ABNORMAL LOW (ref 22–32)
CO2: 21 mmol/L — ABNORMAL LOW (ref 22–32)
Calcium: 9.9 mg/dL (ref 8.9–10.3)
Calcium: 9 mg/dL (ref 8.9–10.3)
Chloride: 103 mmol/L (ref 98–111)
Chloride: 99 mmol/L (ref 98–111)
Creatinine, Ser: 0.7 mg/dL (ref 0.44–1.00)
Creatinine, Ser: 0.82 mg/dL (ref 0.44–1.00)
GFR, Estimated: >60 mL/min
GFR, Estimated: >60 mL/min
Glucose, Bld: 106 mg/dL — ABNORMAL HIGH (ref 70–99)
Glucose, Bld: 92 mg/dL (ref 70–99)
Potassium: 4.3 mmol/L (ref 3.5–5.1)
Potassium: 4.5 mmol/L (ref 3.5–5.1)
Sodium: 134 mmol/L — ABNORMAL LOW (ref 135–145)
Sodium: 130 mmol/L — ABNORMAL LOW (ref 135–145)
Total Bilirubin: 0.3 mg/dL (ref 0.0–1.2)
Total Bilirubin: <0.2 mg/dL (ref 0.0–1.2)
Total Protein: 7.2 g/dL (ref 6.5–8.1)
Total Protein: 6.3 g/dL — ABNORMAL LOW (ref 6.5–8.1)

## 2024-01-25 LAB — CBC
HCT: 31.5 % — ABNORMAL LOW (ref 36.0–46.0)
Hemoglobin: 10.2 g/dL — ABNORMAL LOW (ref 12.0–15.0)
MCH: 25.1 pg — ABNORMAL LOW (ref 26.0–34.0)
MCHC: 32.4 g/dL (ref 30.0–36.0)
MCV: 77.4 fL — ABNORMAL LOW (ref 80.0–100.0)
Platelets: 375 K/uL (ref 150–400)
RBC: 4.07 MIL/uL (ref 3.87–5.11)
RDW: 15.8 % — ABNORMAL HIGH (ref 11.5–15.5)
WBC: 19.5 K/uL — ABNORMAL HIGH (ref 4.0–10.5)
nRBC: 0 % (ref 0.0–0.2)

## 2024-01-25 LAB — PROTEIN / CREATININE RATIO, URINE
Creatinine, Urine: 98 mg/dL
Protein Creatinine Ratio: 0.5 mg/mg — ABNORMAL HIGH
Total Protein, Urine: 44 mg/dL

## 2024-01-25 LAB — SYPHILIS: RPR W/REFLEX TO RPR TITER AND TREPONEMAL ANTIBODIES, TRADITIONAL SCREENING AND DIAGNOSIS ALGORITHM: RPR Ser Ql: NONREACTIVE

## 2024-01-25 LAB — MAGNESIUM: Magnesium: 5.4 mg/dL — ABNORMAL HIGH (ref 1.7–2.4)

## 2024-01-25 LAB — GLUCOSE, CAPILLARY: Glucose-Capillary: 101 mg/dL — ABNORMAL HIGH (ref 70–99)

## 2024-01-25 MED ORDER — COCONUT OIL OIL: 1.0000 | TOPICAL_OIL | Status: DC | PRN

## 2024-01-25 MED ORDER — ONDANSETRON HCL 4 MG PO TABS: 4.0000 mg | ORAL_TABLET | ORAL | Status: DC | PRN

## 2024-01-25 MED ORDER — MAGNESIUM SULFATE 40 GM/1000ML IV SOLN
2.0000 g/h | INTRAVENOUS | Status: AC
  Administered 2024-01-25: 2 g/h via INTRAVENOUS
  Filled 2024-01-25 (×2): qty 1000

## 2024-01-25 MED ORDER — HYDRALAZINE HCL 20 MG/ML IJ SOLN: 10.0000 mg | INTRAMUSCULAR | Status: DC | PRN

## 2024-01-25 MED ORDER — PRENATAL MULTIVITAMIN CH
1.0000 | ORAL_TABLET | Freq: Every day | ORAL | Status: DC
  Administered 2024-01-25 – 2024-01-26 (×2): 1 via ORAL
  Filled 2024-01-25 (×2): qty 1

## 2024-01-25 MED ORDER — TRANEXAMIC ACID-NACL 1000-0.7 MG/100ML-% IV SOLN
1000.0000 mg | INTRAVENOUS | Status: AC
  Administered 2024-01-25: 1000 mg via INTRAVENOUS

## 2024-01-25 MED ORDER — LABETALOL HCL 5 MG/ML IV SOLN
20.0000 mg | INTRAVENOUS | Status: DC | PRN
  Administered 2024-01-25: 20 mg via INTRAVENOUS
  Filled 2024-01-25: qty 4

## 2024-01-25 MED ORDER — MEDROXYPROGESTERONE ACETATE 150 MG/ML IM SUSP: 150.0000 mg | INTRAMUSCULAR | Status: DC | PRN

## 2024-01-25 MED ORDER — SIMETHICONE 80 MG PO CHEW: 80.0000 mg | CHEWABLE_TABLET | ORAL | Status: DC | PRN

## 2024-01-25 MED ORDER — IBUPROFEN 600 MG PO TABS
600.0000 mg | ORAL_TABLET | Freq: Four times a day (QID) | ORAL | Status: DC
  Administered 2024-01-25 – 2024-01-26 (×5): 600 mg via ORAL
  Filled 2024-01-25 (×5): qty 1

## 2024-01-25 MED ORDER — MAGNESIUM SULFATE BOLUS VIA INFUSION
4.0000 g | Freq: Once | INTRAVENOUS | Status: AC
  Administered 2024-01-25: 4 g via INTRAVENOUS
  Filled 2024-01-25: qty 1000

## 2024-01-25 MED ORDER — LABETALOL HCL 5 MG/ML IV SOLN: 80.0000 mg | INTRAVENOUS | Status: DC | PRN

## 2024-01-25 MED ORDER — MEASLES, MUMPS & RUBELLA VAC ~~LOC~~ SUSR: 0.5000 mL | Freq: Once | SUBCUTANEOUS | Status: DC

## 2024-01-25 MED ORDER — DIBUCAINE (PERIANAL) 1 % EX OINT: 1.0000 | TOPICAL_OINTMENT | CUTANEOUS | Status: DC | PRN

## 2024-01-25 MED ORDER — ONDANSETRON HCL 4 MG/2ML IJ SOLN: 4.0000 mg | INTRAMUSCULAR | Status: DC | PRN

## 2024-01-25 MED ORDER — DIPHENHYDRAMINE HCL 25 MG PO CAPS: 25.0000 mg | ORAL_CAPSULE | Freq: Four times a day (QID) | ORAL | Status: DC | PRN

## 2024-01-25 MED ORDER — LABETALOL HCL 5 MG/ML IV SOLN
40.0000 mg | INTRAVENOUS | Status: DC | PRN
  Administered 2024-01-25: 40 mg via INTRAVENOUS
  Filled 2024-01-25: qty 8

## 2024-01-25 MED ORDER — FENTANYL CITRATE (PF) 100 MCG/2ML IJ SOLN
50.0000 ug | INTRAMUSCULAR | Status: DC | PRN
  Administered 2024-01-25 (×3): 100 ug via INTRAVENOUS
  Filled 2024-01-25 (×3): qty 2

## 2024-01-25 MED ORDER — LACTATED RINGERS IV SOLN: INTRAVENOUS | Status: AC

## 2024-01-25 MED ORDER — CEFAZOLIN SODIUM-DEXTROSE 2-4 GM/100ML-% IV SOLN
2.0000 g | Freq: Once | INTRAVENOUS | Status: AC
  Administered 2024-01-25: 2 g via INTRAVENOUS
  Filled 2024-01-25: qty 100

## 2024-01-25 MED ORDER — LABETALOL HCL 5 MG/ML IV SOLN: 40.0000 mg | INTRAVENOUS | Status: DC | PRN

## 2024-01-25 MED ORDER — ACETAMINOPHEN 500 MG PO TABS
1000.0000 mg | ORAL_TABLET | Freq: Four times a day (QID) | ORAL | Status: DC
  Administered 2024-01-25 – 2024-01-26 (×5): 1000 mg via ORAL
  Filled 2024-01-25 (×5): qty 2

## 2024-01-25 MED ORDER — BENZOCAINE-MENTHOL 20-0.5 % EX AERO: 1.0000 | INHALATION_SPRAY | CUTANEOUS | Status: DC | PRN

## 2024-01-25 MED ORDER — SENNOSIDES-DOCUSATE SODIUM 8.6-50 MG PO TABS: 2.0000 | ORAL_TABLET | Freq: Every evening | ORAL | Status: DC | PRN

## 2024-01-25 MED ORDER — TRANEXAMIC ACID-NACL 1000-0.7 MG/100ML-% IV SOLN
INTRAVENOUS | Status: AC
  Filled 2024-01-25: qty 100

## 2024-01-25 MED ORDER — MAGNESIUM HYDROXIDE 400 MG/5ML PO SUSP: 30.0000 mL | Freq: Every day | ORAL | Status: DC | PRN

## 2024-01-25 MED ORDER — TETANUS-DIPHTH-ACELL PERTUSSIS 5-2-15.5 LF-MCG/0.5 IM SUSP: 0.5000 mL | Freq: Once | INTRAMUSCULAR | Status: DC

## 2024-01-25 MED ORDER — LABETALOL HCL 5 MG/ML IV SOLN
80.0000 mg | INTRAVENOUS | Status: DC | PRN
  Filled 2024-01-25: qty 16

## 2024-01-25 MED ORDER — WITCH HAZEL-GLYCERIN EX PADS: 1.0000 | MEDICATED_PAD | CUTANEOUS | Status: DC | PRN

## 2024-01-25 NOTE — Progress Notes (Signed)
 LABOR PROGRESS NOTE Pt rechecked at 0440 after variable decel and pushing noted on the monitor.  Patient uncomfortable, pit at 12  SCE: 5cm by my check, 5/90/-3 to -2 per RN prior  FHT: baseline 140, mod variability, +accels, +variabledecels; overall category II Toco: q2-3 min  Bp at 0500 181/93. Of note, she had bp 160/87 and 160/94 around 0300. preE labs were drawn at that time, though patient states she still has not urinated.  CBC    Component Value Date/Time   WBC 12.9 (H) 01/25/2024 0346   RBC 4.54 01/25/2024 0346   HGB 11.6 (L) 01/25/2024 0346   HGB 10.4 (L) 11/07/2023 1645   HCT 35.6 (L) 01/25/2024 0346   HCT 32.9 (L) 11/07/2023 1645   PLT 338 01/25/2024 0346   PLT 376 11/07/2023 1645   MCV 78.4 (L) 01/25/2024 0346   MCV 79 11/07/2023 1645   MCH 25.6 (L) 01/25/2024 0346   MCHC 32.6 01/25/2024 0346   RDW 16.0 (H) 01/25/2024 0346   RDW 15.9 (H) 11/07/2023 1645   LYMPHSABS 2.5 01/25/2024 0346   LYMPHSABS 2.1 11/07/2023 1645   MONOABS 1.2 (H) 01/25/2024 0346   EOSABS 0.3 01/25/2024 0346   EOSABS 0.5 (H) 11/07/2023 1645   BASOSABS 0.1 01/25/2024 0346   BASOSABS 0.1 11/07/2023 1645   CMP     Component Value Date/Time   NA 134 (L) 01/25/2024 0346   NA 133 (L) 11/07/2023 1645   K 4.3 01/25/2024 0346   CL 103 01/25/2024 0346   CO2 19 (L) 01/25/2024 0346   GLUCOSE 106 (H) 01/25/2024 0346   BUN 12 01/25/2024 0346   BUN 10 11/07/2023 1645   CREATININE 0.70 01/25/2024 0346   CREATININE 0.97 02/12/2015 1105   CALCIUM 9.9 01/25/2024 0346   PROT 7.2 01/25/2024 0346   PROT 6.7 11/07/2023 1645   ALBUMIN 3.5 01/25/2024 0346   ALBUMIN 3.9 11/07/2023 1645   AST 24 01/25/2024 0346   ALT 17 01/25/2024 0346   ALKPHOS 148 (H) 01/25/2024 0346   BILITOT 0.3 01/25/2024 0346   BILITOT <0.2 11/07/2023 1645   EGFR 84 11/07/2023 1645   GFRNONAA >60 01/25/2024 0346   GFRNONAA 76 02/12/2015 1105     A/P:  Offered IV pain medicine preE w/ SF by bp criteria. Start mag.  5A/5P labs ordered to start tonigth [address], DO 5:07 AM

## 2024-01-25 NOTE — Lactation Note (Signed)
 This note was copied from a baby's chart. Lactation Consultation Note  Patient Name: Sarah Arnold Date: 01/25/2024 Age:43 hours   LC spoke with RN regarding P7 Mom declining lactation consult.  Mom declines LC but aware she can ask for [facility] if she changes her mind. Lactation services are complete.  Claudene Aleck BRAVO 01/25/2024, 1:56 PM

## 2024-01-25 NOTE — L&D Delivery Note (Signed)
 OB/GYN Faculty Practice Delivery Note  Sarah Arnold is a 44 y.o. H2E3993 s/p NVD at [redacted]w[redacted]d. She was admitted for IOL.   ROM: 4h 42m with clear/bloody fluid GBS Status: Positive/-- (12/22 1704)  Maximum Maternal Temperature: 98.4  Labor Progress: Initial SVE: 1/T/-3. AROM, Pitocin , and IP Foley. She then progressed to complete.   Delivery Date/Time: 01/25/2024 7:35 AM Delivery: Called to room and patient was complete and pushing. Head delivered straight occiput anterior. nuchal cord absent. Shoulder and body delivered in usual fashion. Infant with spontaneous cry, placed on mother's abdomen, dried and stimulated. Cord clamped x 2 after 1-minute delay, and cut by delivering provider. Cord blood collected . Placenta delivered spontaneously with gentle cord traction. Fundus firm with massage and Pitocin . Clots expressed on fundal rub, uterine sweep performed, Ancef 2g once ordered. TXA given for output and grandmaltiparity status. Labia, perineum, vagina, and cervix were inspected and repaired.  Placenta:  spontaneous Intact Placenta to L&D Complications:none Lacerations: 1st degree QBL: Analgesia: IV pain meds  Newborn Data:  Living status:Living Gender:Female Apgars:8 ,9  Weight:5 lb 15.9 oz (2.72 kg)           Barabara Maier, DO FM-OB Fellow Center for [employer]

## 2024-01-25 NOTE — Progress Notes (Signed)
 LABOR PROGRESS NOTE Pt rechecked at 0245 Patient comfortable without epidural. Pit at 10. SCE: 5/70/-3, posterior AROM blood tinged  FHT: baseline 140, mod variability, +accels, -decels; overall category I Toco: 3-4x in but irregular  A/P:  Continue to uptitrate pit per protocol  Barabara Maier, DO 2:53 AM

## 2024-01-25 NOTE — Lactation Note (Signed)
 This note was copied from a baby's chart. Lactation Consultation Note  Patient Name: Sarah Arnold Unijb'd Date: 01/25/2024 Age:43 hours   LC noted that P7 Mom declined seeing LC on admission.  LC asked RN to verify that with Mom and let LC know what she chooses.    Claudene Aleck BRAVO 01/25/2024, 12:22 PM

## 2024-01-25 NOTE — MAU Provider Note (Incomplete)
 " History     [id number]  Arrival date and time: 01/24/24 1307    Chief Complaint  Patient presents with   Fetal Monitoring     HPI Sarah Arnold is a 44 y.o. at [redacted]w[redacted]d who presents for non-reactive NST at the office. She has no complaints.     O/Positive/-- (07/17 0000)  Past Medical History:  Diagnosis Date   Abnormal Pap smear    Allergic rhinitis 04/10/2013   Anemia    Cough 02/11/2013   Gastritis 05/14/2013   Gestational diabetes    Healthcare maintenance 02/12/2015   Hypertension    Late prenatal care    Multiple skin nodules 05/15/2014   Pneumococcal pneumonia    Shortness of breath 02/11/2013    Past Surgical History:  Procedure Laterality Date   NO PAST SURGERIES     VIDEO BRONCHOSCOPY Bilateral 04/17/2013   Procedure: VIDEO BRONCHOSCOPY WITHOUT FLUORO;  Surgeon: Lamar GORMAN Chris, MD;  Location: [facility];  Service: Cardiopulmonary;  Laterality: Bilateral;    Family History  Problem Relation Age of Onset   Hypertension Father    Asthma Son    Arthritis Neg Hx    Alcohol abuse Neg Hx    Birth defects Neg Hx    Cancer Neg Hx    COPD Neg Hx    Depression Neg Hx    Diabetes Neg Hx    Drug abuse Neg Hx    Early death Neg Hx    Hearing loss Neg Hx    Heart disease Neg Hx    Hyperlipidemia Neg Hx    Kidney disease Neg Hx    Learning disabilities Neg Hx    Mental illness Neg Hx    Mental retardation Neg Hx    Miscarriages / Stillbirths Neg Hx    Stroke Neg Hx    Vision loss Neg Hx     Social History   Socioeconomic History   Marital status: Married    Spouse name: Not on file   Number of children: 6   Years of education: Not on file   Highest education level: Not on file  Occupational History   Occupation: unemployed  Tobacco Use   Smoking status: Never   Smokeless tobacco: Never  Vaping Use   Vaping status: Never Used  Substance and Sexual Activity   Alcohol use: No   Drug use: No    Sexual activity: Not Currently  Other Topics Concern   Not on file  Social History Narrative   Immigrated from [place] in 2001   No known TB exposures   Lives now in [place] in [place]   Social Drivers of Health   Tobacco Use: Low Risk (01/24/2024)   Patient History    Smoking Tobacco Use: Never    Smokeless Tobacco Use: Never    Passive Exposure: Not on file  Financial Resource Strain: Low Risk (01/19/2023)   Overall Financial Resource Strain (CARDIA)    Difficulty of Paying Living Expenses: Not hard at all  Food Insecurity: No Food Insecurity (01/24/2024)   Epic    Worried About Radiation Protection Practitioner of Food in the Last Year: Never true    Ran Out of Food in the Last Year: Never true  Transportation Needs: No Transportation Needs (01/24/2024)   Epic    Lack of Transportation (Medical): No    Lack of Transportation (Non-Medical): No  Physical Activity: Insufficiently Active (01/19/2023)   Exercise Vital Sign    Days of Exercise per Week: 2 days  Minutes of Exercise per Session: 20 min  Stress: No Stress Concern Present (01/19/2023)   [employer] of Occupational Health - Occupational Stress Questionnaire    Feeling of Stress : Not at all  Social Connections: Moderately Integrated (01/19/2023)   Social Connection and Isolation Panel    Frequency of Communication with Friends and Family: More than three times a week    Frequency of Social Gatherings with Friends and Family: More than three times a week    Attends Religious Services: Never    Database Administrator or Organizations: Yes    Attends Engineer, Structural: More than 4 times per year    Marital Status: Married  Catering Manager Violence: Not At Risk (01/24/2024)   Epic    Fear of Current or Ex-Partner: No    Emotionally Abused: No    Physically Abused: No    Sexually Abused: No  Depression (PHQ2-9): Low Risk (09/06/2023)   Depression (PHQ2-9)    PHQ-2 Score: 1  Alcohol Screen: Low Risk  (01/19/2023)   Alcohol Screen    Last Alcohol Screening Score (AUDIT): 0  Housing: Low Risk (01/24/2024)   Epic    Unable to Pay for Housing in the Last Year: No    Number of Times Moved in the Last Year: 0    Homeless in the Last Year: No  Utilities: Not At Risk (01/24/2024)   Epic    Threatened with loss of utilities: No  Health Literacy: Inadequate Health Literacy (01/19/2023)   B1300 Health Literacy    Frequency of need for help with medical instructions: Sometimes    Allergies[1]  Medications Ordered Prior to Encounter[2]  Pertinent positives and negative per HPI, all others reviewed and negative  Physical Exam   BP (!) 143/88 (BP Location: Right Arm)   Pulse 88   Temp 98.2 F (36.8 C) (Oral)   Resp 18   Ht 5' 4 (1.626 m)   Wt 99.1 kg   LMP 03/27/2023 (Approximate)   SpO2 100%   BMI 37.49 kg/m   Patient Vitals for the past 24 hrs:  BP Temp Temp src Pulse Resp SpO2 Height Weight  01/24/24 1323 (!) 143/88 98.2 F (36.8 C) Oral 88 18 100 % -- --  01/24/24 1319 -- -- -- -- -- -- 5' 4 (1.626 m) 99.1 kg    Physical Exam Vitals and nursing note reviewed.  Constitutional:      Appearance: She is well-developed.  HENT:     Head: Normocephalic and atraumatic.     Mouth/Throat:     Mouth: Mucous membranes are moist.  Eyes:     Extraocular Movements: Extraocular movements intact.  Cardiovascular:     Rate and Rhythm: Normal rate and regular rhythm.  Pulmonary:     Effort: Pulmonary effort is normal.  Abdominal:     Palpations: Abdomen is soft.     Tenderness: There is no abdominal tenderness.  Skin:    Capillary Refill: Capillary refill takes less than 2 seconds.  Neurological:     General: No focal deficit present.     Mental Status: She is alert.     FHT Baseline: *** bpm Variability: {:31519} Accelerations: {:31520} Decelerations: {FHR DECEL PRESENT:31526} Uterine activity: {Uterine contractions:31516}  Labs No results found for this or  any previous visit (from the past 24 hours).  Imaging No results found.  MAU Course  Procedures Lab Orders  No laboratory test(s) ordered today   No orders of the defined types  were placed in this encounter.   Imaging Orders         US  MFM FETAL BPP WO NON STRESS     MDM Moderate (Level 3-4)  Assessment and Plan  Supervision of high risk pregnancy, antepartum  Pre-existing hypertension affecting pregnancy in third trimester - Plan: CBC, Type and screen, RPR, Comprehensive metabolic panel, CBC, Type and screen, RPR, Comprehensive metabolic panel  Diet controlled gestational diabetes mellitus (GDM) in third trimester  Alpha thalassemia silent carrier  Increased Carrier risk for spinal muscular atrophy  Decreased fetal movements in third trimester, single or unspecified fetus  [redacted] weeks gestation of pregnancy   #FWB: NST: Reactive       Samadhi Mahurin L Laken Rog, MD/MHA 01/24/2024 5:02 PM  Allergies as of 01/24/2024       Reactions   Norvasc  [amlodipine ]    Caused constipation        Medication List     TAKE these medications    Accu-Chek Guide w/Device Kit 1 kit by Does not apply route in the morning, at noon, in the evening, and at bedtime.   Accu-Chek Softclix Lancets lancets Dx code 024.419 check glucose four times a day: Fasting, 2 hours after breakfast, 2 hours after lunch and 2 hours after dinner   aspirin  EC 81 MG tablet Take 1 tablet (81 mg total) by mouth daily. Take after 12 weeks for prevention of preeclampsia later in pregnancy   ferrous sulfate  325 (65 FE) MG tablet Take 1 tablet (325 mg total) by mouth every other day.   FT ClearLax 17 GM/SCOOP powder Generic drug: polyethylene glycol powder Take 17 g by mouth daily as needed for mild constipation.   glucose blood test strip Dx code 024.419 check glucose four times a day: Fasting, 2 hours after breakfast, 2 hours after lunch and 2 hours after dinner   labetalol  100 MG tablet Commonly  known as: NORMODYNE  Take 1 tablet (100 mg total) by mouth 2 (two) times daily.   multivitamin-prenatal 27-0.8 MG Tabs tablet Take 1 tablet by mouth daily at 12 noon.             [1] Allergies Allergen Reactions   Norvasc  [Amlodipine ]     Caused constipation  [2] No current facility-administered medications on file prior to encounter.   Current Outpatient Medications on File Prior to Encounter  Medication Sig Dispense Refill   aspirin  EC 81 MG tablet Take 1 tablet (81 mg total) by mouth daily. Take after 12 weeks for prevention of preeclampsia later in pregnancy 300 tablet 2   ferrous sulfate  325 (65 FE) MG tablet Take 1 tablet (325 mg total) by mouth every other day. 30 tablet 11   labetalol  (NORMODYNE ) 100 MG tablet Take 1 tablet (100 mg total) by mouth 2 (two) times daily. 60 tablet 1   polyethylene glycol powder (GLYCOLAX /MIRALAX ) 17 GM/SCOOP powder Take 17 g by mouth daily as needed for mild constipation. 3350 g 1   Prenatal Vit-Fe Fumarate-FA (MULTIVITAMIN-PRENATAL) 27-0.8 MG TABS tablet Take 1 tablet by mouth daily at 12 noon.     Accu-Chek Softclix Lancets lancets Dx code 024.419 check glucose four times a day: Fasting, 2 hours after breakfast, 2 hours after lunch and 2 hours after dinner 100 each 12   Blood Glucose Monitoring Suppl (ACCU-CHEK GUIDE) w/Device KIT 1 kit by Does not apply route in the morning, at noon, in the evening, and at bedtime. 1 kit 0   glucose blood test strip Dx code 024.419 check  glucose four times a day: Fasting, 2 hours after breakfast, 2 hours after lunch and 2 hours after dinner 100 each 12  "

## 2024-01-25 NOTE — Discharge Summary (Signed)
 "    Postpartum Discharge Summary  Date of Service updated***     Patient Name: Sarah Arnold DOB: 17-Dec-1980 MRN: [id number]  Date of admission: 01/24/2024 Delivery date:01/25/2024 Delivering provider: DANNY GERALDS Date of discharge: 01/25/2024  Admitting diagnosis: Chronic hypertension [I10] Intrauterine pregnancy: [redacted]w[redacted]d     Secondary diagnosis:  Principal Problem:   Chronic hypertension Active Problems:   Class 2 severe obesity with serious comorbidity and body mass index (BMI) of 35.0 to 35.9 in adult   GDM (gestational diabetes mellitus)   AMA (advanced maternal age) multigravida 35+   Oligohydramnios   Chronic hypertension complicating or reason for care during pregnancy, third trimester   [redacted] weeks gestation of pregnancy   Decreased fetal movements in third trimester   AMA (advanced maternal age) multigravida 35+, third trimester   Preeclampsia, severe  Additional problems: ***    Discharge diagnosis: Term Pregnancy Delivered, Preeclampsia (severe), CHTN, and GDM A1                                              Post partum procedures:{Postpartum procedures:23558} Augmentation: AROM, Pitocin , and IP Foley Complications: None  Hospital course: Induction of Labor With Vaginal Delivery   44 y.o. yo [id number] at [redacted]w[redacted]d was admitted to the hospital 01/24/2024 for induction of labor.  Indication for induction: Preeclampsia and A1 DM.  Patient had an labor course complicated by*** Membrane Rupture Time/Date: 2:41 AM,01/25/2024  Delivery Method:Vaginal, Spontaneous Operative Delivery:N/A Episiotomy: None Lacerations:  1st degree;Perineal Details of delivery can be found in separate delivery note.  Patient had a postpartum course complicated by***. Patient is discharged home 01/25/2024.  Newborn Data: Birth date:01/25/2024 Birth time:7:35 AM Gender:Female Living status:Living Apgars:8 ,9  Weight:   Magnesium  Sulfate received: Yes: Seizure prophylaxis BMZ received:  No Rhophylac:N/A MMR:{MMR:30440033} T-DaP:Given prenatally Flu: Yes RSV Vaccine received: {RSV:31013} Transfusion:{Transfusion received:30440034}  Immunizations received: Immunization History  Administered Date(s) Administered   Influenza, Seasonal, Injecte, Preservative Fre 11/20/2023   Influenza,inj,Quad PF,6+ Mos 10/05/2012, 02/12/2015, 03/09/2018, 01/31/2022   PFIZER(Purple Top)SARS-COV-2 Vaccination 05/02/2019   Tdap 05/22/2023, 11/07/2023    Physical exam  Vitals:   01/25/24 0815 01/25/24 0830 01/25/24 0845 01/25/24 0901  BP: (!) 176/98 (!) 173/83 (!) 136/101 132/81  Pulse: 92 84 89 79  Resp:  16    Temp:      TempSrc:      SpO2:      Weight:      Height:       General: {Exam; general:21111117} Lochia: {Desc; appropriate/inappropriate:30686::appropriate} Uterine Fundus: {Desc; firm/soft:30687} Incision: {Exam; incision:21111123} DVT Evaluation: {Exam; dvt:2111122} Labs: Lab Results  Component Value Date   WBC 12.9 (H) 01/25/2024   HGB 11.6 (L) 01/25/2024   HCT 35.6 (L) 01/25/2024   MCV 78.4 (L) 01/25/2024   PLT 338 01/25/2024      Latest Ref Rng & Units 01/25/2024    3:46 AM  CMP  Glucose 70 - 99 mg/dL 893   BUN 6 - 20 mg/dL 12   Creatinine 9.55 - 1.00 mg/dL 9.29   Sodium 864 - 854 mmol/L 134   Potassium 3.5 - 5.1 mmol/L 4.3   Chloride 98 - 111 mmol/L 103   CO2 22 - 32 mmol/L 19   Calcium 8.9 - 10.3 mg/dL 9.9   Total Protein 6.5 - 8.1 g/dL 7.2   Total Bilirubin 0.0 - 1.2 mg/dL 0.3  Alkaline Phos 38 - 126 U/L 148   AST 15 - 41 U/L 24   ALT 0 - 44 U/L 17    Edinburgh Score:     No data to display         No data recorded  After visit meds:  Allergies as of 01/25/2024       Reactions   Norvasc  [amlodipine ]    Caused constipation   Porcine (pork) Protein-containing Drug Products      Med Rec must be completed prior to using this [facility]***        Discharge home in stable condition Infant Feeding: {Baby feeding:23562} Infant  Disposition:{CHL IP OB HOME WITH FNUYZM:76418} Discharge instruction: per After Visit Summary and Postpartum booklet. Activity: Advance as tolerated. Pelvic rest for 6 weeks.  Diet: {OB ipzu:78888878} Future Appointments:No future appointments. Follow up Visit: Message sent 1/1  Please schedule this patient for a In person postpartum visit in 6 weeks with the following provider: Any provider. Additional Postpartum F/U:2 hour GTT and BP check 1 week  High risk pregnancy complicated by: GDM and HTN Delivery mode:  Vaginal, Spontaneous Anticipated Birth Control:  Unsure   01/25/2024 Barabara Maier, DO    "

## 2024-01-26 ENCOUNTER — Other Ambulatory Visit (HOSPITAL_COMMUNITY): Payer: Self-pay

## 2024-01-26 LAB — COMPREHENSIVE METABOLIC PANEL WITH GFR
ALT: 18 U/L (ref 0–44)
ALT: 18 U/L (ref 0–44)
AST: 21 U/L (ref 15–41)
AST: 19 U/L (ref 15–41)
Albumin: 3.2 g/dL — ABNORMAL LOW (ref 3.5–5.0)
Albumin: 3.3 g/dL — ABNORMAL LOW (ref 3.5–5.0)
Alkaline Phosphatase: 126 U/L (ref 38–126)
Alkaline Phosphatase: 125 U/L (ref 38–126)
Anion gap: 11 (ref 5–15)
Anion gap: 11 (ref 5–15)
BUN: 18 mg/dL (ref 6–20)
BUN: 19 mg/dL (ref 6–20)
CO2: 21 mmol/L — ABNORMAL LOW (ref 22–32)
CO2: 22 mmol/L (ref 22–32)
Calcium: 8.4 mg/dL — ABNORMAL LOW (ref 8.9–10.3)
Calcium: 8.3 mg/dL — ABNORMAL LOW (ref 8.9–10.3)
Chloride: 100 mmol/L (ref 98–111)
Chloride: 102 mmol/L (ref 98–111)
Creatinine, Ser: 0.9 mg/dL (ref 0.44–1.00)
Creatinine, Ser: 0.89 mg/dL (ref 0.44–1.00)
GFR, Estimated: >60 mL/min
GFR, Estimated: >60 mL/min
Glucose, Bld: 120 mg/dL — ABNORMAL HIGH (ref 70–99)
Glucose, Bld: 95 mg/dL (ref 70–99)
Potassium: 4.3 mmol/L (ref 3.5–5.1)
Potassium: 4.4 mmol/L (ref 3.5–5.1)
Sodium: 131 mmol/L — ABNORMAL LOW (ref 135–145)
Sodium: 135 mmol/L (ref 135–145)
Total Bilirubin: <0.2 mg/dL (ref 0.0–1.2)
Total Bilirubin: <0.2 mg/dL (ref 0.0–1.2)
Total Protein: 6.1 g/dL — ABNORMAL LOW (ref 6.5–8.1)
Total Protein: 6.3 g/dL — ABNORMAL LOW (ref 6.5–8.1)

## 2024-01-26 LAB — CBC
HCT: 29.2 % — ABNORMAL LOW (ref 36.0–46.0)
HCT: 28.4 % — ABNORMAL LOW (ref 36.0–46.0)
Hemoglobin: 9.5 g/dL — ABNORMAL LOW (ref 12.0–15.0)
Hemoglobin: 9.3 g/dL — ABNORMAL LOW (ref 12.0–15.0)
MCH: 25.4 pg — ABNORMAL LOW (ref 26.0–34.0)
MCH: 25.8 pg — ABNORMAL LOW (ref 26.0–34.0)
MCHC: 32.5 g/dL (ref 30.0–36.0)
MCHC: 32.7 g/dL (ref 30.0–36.0)
MCV: 78.1 fL — ABNORMAL LOW (ref 80.0–100.0)
MCV: 78.7 fL — ABNORMAL LOW (ref 80.0–100.0)
Platelets: 354 K/uL (ref 150–400)
Platelets: 344 K/uL (ref 150–400)
RBC: 3.74 MIL/uL — ABNORMAL LOW (ref 3.87–5.11)
RBC: 3.61 MIL/uL — ABNORMAL LOW (ref 3.87–5.11)
RDW: 15.9 % — ABNORMAL HIGH (ref 11.5–15.5)
RDW: 15.9 % — ABNORMAL HIGH (ref 11.5–15.5)
WBC: 15.5 K/uL — ABNORMAL HIGH (ref 4.0–10.5)
WBC: 12.9 K/uL — ABNORMAL HIGH (ref 4.0–10.5)
nRBC: 0 % (ref 0.0–0.2)
nRBC: 0 % (ref 0.0–0.2)

## 2024-01-26 LAB — GLUCOSE, CAPILLARY: Glucose-Capillary: 117 mg/dL — ABNORMAL HIGH (ref 70–99)

## 2024-01-26 LAB — MAGNESIUM
Magnesium: 6.3 mg/dL (ref 1.7–2.4)
Magnesium: 3.1 mg/dL — ABNORMAL HIGH (ref 1.7–2.4)

## 2024-01-26 MED ORDER — FUROSEMIDE 20 MG PO TABS
20.0000 mg | ORAL_TABLET | Freq: Every day | ORAL | Status: DC
  Administered 2024-01-26: 20 mg via ORAL
  Filled 2024-01-26: qty 1

## 2024-01-26 MED ORDER — FERROUS SULFATE 325 (65 FE) MG PO TABS
325.0000 mg | ORAL_TABLET | ORAL | 0 refills | Status: AC
  Filled 2024-01-26: qty 30, 60d supply, fill #0

## 2024-01-26 MED ORDER — FERROUS SULFATE 325 (65 FE) MG PO TABS
325.0000 mg | ORAL_TABLET | ORAL | Status: DC
  Administered 2024-01-26: 325 mg via ORAL
  Filled 2024-01-26: qty 1

## 2024-01-26 MED ORDER — POTASSIUM CHLORIDE CRYS ER 20 MEQ PO TBCR
20.0000 meq | EXTENDED_RELEASE_TABLET | Freq: Every day | ORAL | 0 refills | Status: AC
  Filled 2024-01-26: qty 5, 5d supply, fill #0

## 2024-01-26 MED ORDER — POTASSIUM CHLORIDE CRYS ER 20 MEQ PO TBCR
20.0000 meq | EXTENDED_RELEASE_TABLET | Freq: Every day | ORAL | Status: DC
  Administered 2024-01-26: 20 meq via ORAL
  Filled 2024-01-26: qty 1

## 2024-01-26 MED ORDER — IBUPROFEN 600 MG PO TABS
600.0000 mg | ORAL_TABLET | Freq: Four times a day (QID) | ORAL | 0 refills | Status: AC
  Filled 2024-01-26: qty 30, 8d supply, fill #0

## 2024-01-26 MED ORDER — ACETAMINOPHEN 325 MG PO TABS
650.0000 mg | ORAL_TABLET | ORAL | 0 refills | Status: AC | PRN
  Filled 2024-01-26: qty 30, 3d supply, fill #0

## 2024-01-26 MED ORDER — LABETALOL HCL 200 MG PO TABS
200.0000 mg | ORAL_TABLET | Freq: Two times a day (BID) | ORAL | 0 refills | Status: AC
  Filled 2024-01-26: qty 60, 30d supply, fill #0

## 2024-01-26 MED ORDER — FUROSEMIDE 20 MG PO TABS
20.0000 mg | ORAL_TABLET | Freq: Every day | ORAL | 0 refills | Status: AC
  Filled 2024-01-26: qty 5, 5d supply, fill #0

## 2024-01-26 NOTE — Plan of Care (Signed)
" °  Problem: Education: Goal: Knowledge of General Education information will improve Description: Including pain rating scale, medication(s)/side effects and non-pharmacologic comfort measures Outcome: Progressing   Problem: Health Behavior/Discharge Planning: Goal: Ability to manage health-related needs will improve Outcome: Progressing   Problem: Clinical Measurements: Goal: Ability to maintain clinical measurements within normal limits will improve Outcome: Progressing   Problem: Clinical Measurements: Goal: Will remain free from infection Outcome: Progressing   Problem: Clinical Measurements: Goal: Respiratory complications will improve Outcome: Progressing   Problem: Clinical Measurements: Goal: Cardiovascular complication will be avoided Outcome: Progressing   Problem: Nutrition: Goal: Adequate nutrition will be maintained Outcome: Progressing   Problem: Coping: Goal: Level of anxiety will decrease Outcome: Progressing   "

## 2024-01-26 NOTE — Progress Notes (Signed)
 Post Partum Day 1 SVD Subjective: no complaints, up ad lib, voiding, and tolerating PO. Denies heavy bleeding.   Objective: Blood pressure 123/68, pulse 81, temperature 98.2 F (36.8 C), resp. rate 15, height 5' 4 (1.626 m), weight 99.1 kg, last menstrual period 03/27/2023, SpO2 94%, unknown if currently breastfeeding.  Physical Exam:  General: alert, cooperative, and no distress Lochia: appropriate Uterine Fundus: firm Incision: NA DVT Evaluation: No significant calf/ankle edema.  Recent Labs    01/25/24 1702 01/26/24 0436  HGB 10.2* 9.5*  HCT 31.5* 29.2*    Assessment/Plan: CHTN now PreE with SF - Bps normal - Start Lasix/K - s/p Magnesium  - AM labs negative for HELLP. Cr 0.90.   Anemia - Had anemia of pregnancy prior to delivery.  - Patient was started on oral iron therapy for clinically significant but asymptomatic acute anemia due to expected blood loss.  GDMA1 - Will need 2 hr at [facility] visit.  - Fasting CBG today is 117.   Sarcoidosis - No acute issues.   Postpartum - Contraception: Desires BTS. Has not signed forms. Will sign today and plan for interval tubal. She declines birth control to bridge.  - MOF: Breats - Rh status: Rh pos - Rubella status: Immune - Dispo: Requests discharge today. BP normal range. Will see how they are through this afternoon and possible PM discharge.  - Consults: Lactation  Neonatal - Doing well - Circumcision: Parents request.    LOS: 2 days   Vina Solian 01/26/2024, 9:37 AM

## 2024-01-26 NOTE — Progress Notes (Signed)
 Date and time results received: 01/26/2024 0715 (use smartphrase .now to insert current time)  Test: mag Critical Value: 6.2  Name of Provider Notified: BASS  Orders Received? Or Actions Taken?: NO NEW ORDERS . Mag comes off at 0730 am

## 2024-01-27 ENCOUNTER — Inpatient Hospital Stay (HOSPITAL_COMMUNITY)

## 2024-01-27 ENCOUNTER — Inpatient Hospital Stay (HOSPITAL_COMMUNITY): Admission: RE | Admit: 2024-01-27 | Source: Home / Self Care | Admitting: Obstetrics & Gynecology

## 2024-01-29 ENCOUNTER — Encounter

## 2024-02-01 ENCOUNTER — Ambulatory Visit: Payer: Self-pay

## 2024-02-03 ENCOUNTER — Telehealth (HOSPITAL_COMMUNITY): Payer: Self-pay | Admitting: *Deleted

## 2024-02-03 NOTE — Telephone Encounter (Signed)
 Attempted hospital discharge follow-up phone call. Message received stating, The mailbox is full and cannot accept any messages at this time.   Allean IVAR Carton, RN, 02/03/24, 1020

## 2024-02-05 ENCOUNTER — Encounter: Admitting: Obstetrics & Gynecology

## 2024-02-12 ENCOUNTER — Encounter: Admitting: Obstetrics and Gynecology

## 2024-02-16 ENCOUNTER — Ambulatory Visit

## 2024-02-21 ENCOUNTER — Ambulatory Visit: Payer: Self-pay

## 2024-02-27 ENCOUNTER — Other Ambulatory Visit: Payer: Self-pay

## 2024-02-27 DIAGNOSIS — O24410 Gestational diabetes mellitus in pregnancy, diet controlled: Secondary | ICD-10-CM

## 2024-02-29 ENCOUNTER — Ambulatory Visit: Payer: Self-pay | Admitting: Obstetrics and Gynecology

## 2024-02-29 ENCOUNTER — Other Ambulatory Visit: Payer: Self-pay
